# Patient Record
Sex: Male | Born: 1940 | Race: White | Hispanic: No | Marital: Married | Smoking: Former smoker
Health system: Southern US, Community
[De-identification: ages and names within clinical notes are randomized; demographics above are authoritative.]

## PROBLEM LIST (undated history)

## (undated) DIAGNOSIS — E785 Hyperlipidemia, unspecified: Secondary | ICD-10-CM

## (undated) DIAGNOSIS — H3530 Unspecified macular degeneration: Secondary | ICD-10-CM

## (undated) DIAGNOSIS — M545 Low back pain: Secondary | ICD-10-CM

## (undated) DIAGNOSIS — L719 Rosacea, unspecified: Secondary | ICD-10-CM

## (undated) DIAGNOSIS — K219 Gastro-esophageal reflux disease without esophagitis: Secondary | ICD-10-CM

## (undated) DIAGNOSIS — C61 Malignant neoplasm of prostate: Secondary | ICD-10-CM

## (undated) DIAGNOSIS — F5104 Psychophysiologic insomnia: Secondary | ICD-10-CM

## (undated) DIAGNOSIS — G8929 Other chronic pain: Secondary | ICD-10-CM

## (undated) DIAGNOSIS — M25512 Pain in left shoulder: Secondary | ICD-10-CM

## (undated) HISTORY — DX: Rosacea, unspecified: L71.9

## (undated) HISTORY — DX: Low back pain: M54.5

## (undated) HISTORY — PX: HERNIA REPAIR: SHX51

## (undated) HISTORY — DX: Other chronic pain: G89.29

## (undated) HISTORY — DX: Pain in left shoulder: M25.512

## (undated) HISTORY — DX: Hyperlipidemia, unspecified: E78.5

## (undated) HISTORY — DX: Unspecified macular degeneration: H35.30

## (undated) HISTORY — DX: Malignant neoplasm of prostate: C61

## (undated) HISTORY — DX: Gastro-esophageal reflux disease without esophagitis: K21.9

## (undated) HISTORY — DX: Psychophysiologic insomnia: F51.04

---

## 2006-02-16 ENCOUNTER — Emergency Department (HOSPITAL_COMMUNITY): Admission: EM | Admit: 2006-02-16 | Discharge: 2006-02-16 | Payer: Self-pay | Admitting: Emergency Medicine

## 2006-12-24 ENCOUNTER — Ambulatory Visit (HOSPITAL_COMMUNITY): Admission: RE | Admit: 2006-12-24 | Discharge: 2006-12-24 | Payer: Self-pay | Admitting: Gastroenterology

## 2009-04-30 ENCOUNTER — Encounter (INDEPENDENT_AMBULATORY_CARE_PROVIDER_SITE_OTHER): Payer: Self-pay | Admitting: Urology

## 2009-04-30 ENCOUNTER — Inpatient Hospital Stay (HOSPITAL_COMMUNITY): Admission: RE | Admit: 2009-04-30 | Discharge: 2009-05-01 | Payer: Self-pay | Admitting: Urology

## 2010-09-22 HISTORY — PX: PROSTATE SURGERY: SHX751

## 2010-12-28 LAB — BASIC METABOLIC PANEL
CO2: 32 mEq/L (ref 19–32)
Calcium: 9.4 mg/dL (ref 8.4–10.5)
Creatinine, Ser: 1.04 mg/dL (ref 0.4–1.5)
GFR calc Af Amer: >60 mL/min (ref >60)
GFR calc non Af Amer: >60 mL/min (ref >60)
Sodium: 138 mEq/L (ref 135–145)

## 2010-12-28 LAB — HEMOGLOBIN AND HEMATOCRIT, BLOOD: HCT: 37.7 % — ABNORMAL LOW (ref 39.0–52.0)

## 2010-12-28 LAB — CBC
Hemoglobin: 14.2 g/dL (ref 13.0–17.0)
MCHC: 33.9 g/dL (ref 30.0–36.0)
RBC: 4.51 MIL/uL (ref 4.22–5.81)
RDW: 13 % (ref 11.5–15.5)

## 2010-12-28 LAB — TYPE AND SCREEN
ABO/RH(D): O NEG
Antibody Screen: NEGATIVE

## 2011-02-04 NOTE — Discharge Summary (Signed)
NAME:  COSTA, JHA NO.:  [id number]   MEDICAL RECORD NO.:  [id number]          PATIENT TYPE:  INP   LOCATION:  1418                         FACILITY:  [facility]   PHYSICIAN:  Heloise Purpura, MD      DATE OF BIRTH:  04-Sep-1941   DATE OF ADMISSION:  04/30/2009  DATE OF DISCHARGE:  05/01/2009                               DISCHARGE SUMMARY   ADMISSION DIAGNOSES:  1. Clinically localized adenocarcinoma of the prostate (clinical stage      T1C NX MX).  2. Recurrent balanitis.   DISCHARGE DIAGNOSES:  1. Clinically localized adenocarcinoma of the prostate (clinical stage      T1C NX MX).  2. Recurrent balanitis.   PROCEDURES:  1. Robotic-assisted laparoscopic radical prostatectomy (bilateral      nerve sparing).  2. Circumcision.   HISTORY AND PHYSICAL:  For full details, please see admission history  and physical.  Mr. Sperbeck is a 70 year old gentleman who was recently  found to have clinically localized adenocarcinoma of the prostate.  He  underwent an extensive evaluation and discussion with multiple  physicians regarding management options for treatment and elected to  proceed with surgical therapy in the above-named procedure.  In  addition, he has a history of recurrent balanitis, and did wish to  proceed with a circumcision.   HOSPITAL COURSE:  On April 30, 2009, he was taken to the operating room  where he underwent the above-named procedures, which he tolerated well  without complications.  Postoperatively, he was able to be transferred  to a regular hospital room following recovery from anesthesia.  He was  able to begin ambulation that evening.  He remained hemodynamically  stable.  His postoperative hematocrit was 37.7.  On the morning of  postoperative day #1, his hematocrit was also found to be stable at  33.3.  He maintained excellent urine output with minimal output from his  pelvic drain.  Therefore, the pelvic drain was removed.  He was placed  on a clear liquid diet and continued to ambulate.  He was reevaluated on  the afternoon of postoperative day #1.  Although he did have complaints  of hiccups while ambulating.  His urine output, blood pressure, and  pulse all remained stable.  He was also able to tolerate his clear  liquid diet without nausea or vomiting.  He was felt to be stable for  discharge as he had met all discharge criteria.   DISPOSITION:  Home.   DISCHARGE MEDICATIONS:  He was instructed to resume his regular home  medications including trazodone.  In addition, he was provided a  prescription for Vicodin to use as needed for pain and told to use  Colace as a stool softener.  He was instructed to hold his aspirin,  herbal supplements, and multivitamins for 7 days.  He was also given a  prescription for Cipro to begin 1 day prior to return visit for removal  of Foley catheter.   DISCHARGE INSTRUCTIONS:  He was instructed to be ambulatory but  specifically told to refrain from any heavy lifting, strenuous activity,  or driving.  He was instructed on routine Foley catheter care and told  to gradually advance his diet over the course of the next few days.   FOLLOW UP:  He will follow up in 1 week for removal of Foley catheter  and skin staples.      Delia Chimes, NP      Heloise Purpura, MD  Electronically Signed    MA/MEDQ  D:  05/01/2009  T:  05/01/2009  Job:  [id number]

## 2011-02-04 NOTE — Op Note (Signed)
NAME:  Harold Leach, Harold Leach NO.:  [id number]   MEDICAL RECORD NO.:  [id number]          PATIENT TYPE:  INP   LOCATION:  0001                         FACILITY:  [facility]   PHYSICIAN:  Heloise Purpura, MD      DATE OF BIRTH:  Mar 17, 1941   DATE OF PROCEDURE:  04/30/2009  DATE OF DISCHARGE:                               OPERATIVE REPORT   PREOPERATIVE DIAGNOSES:  1. Clinically localized adenocarcinoma of prostate (clinical stage      T1C, NX, MX).  2. Recurrent balanitis.   POSTOPERATIVE DIAGNOSES:  1. Clinically localized adenocarcinoma of prostate (clinical stage      T1C, NX, MX).  2. Recurrent balanitis.   PROCEDURES:  1. Robotic assisted laparoscopic radical prostatectomy (bilateral      nerve sparing).  2. Circumcision.   SURGEON:  Dr. Heloise Purpura.   ASSISTANT:  Delia Chimes, nurse practitioner.   ANESTHESIA:  General.   COMPLICATIONS:  None.   ESTIMATED BLOOD LOSS:  100 mL.   INTRAVENOUS FLUIDS:  2 liters lactated Ringer's.   SPECIMENS:  1. Prostate and seminal vesicles.  2. Foreskin.   DISPOSITION OF SPECIMENS:  To pathology.   DRAINS:  1. 20-French coude catheter.  2. #19 Blake pelvic drain.   INDICATION:  Harold Leach is a 70 year old gentleman who was recently found  to have clinically localized adenocarcinoma of the prostate.  He  underwent an extensive evaluation and discussion with multiple  physicians regarding management options for treatment and elects to  proceed with surgical therapy and the above procedure.  In addition, he  has a history of recurrent balanitis and did wish to proceed with a  circumcision.  The potential risks, complications, and alternative  treatment options were discussed in detail and informed consent was  obtained.   DESCRIPTION OF PROCEDURE:  The patient was taken to the operating room  and a general anesthetic was administered.  He was given preoperative  antibiotics, placed in the dorsal lithotomy position,  and prepped and  draped in the usual sterile fashion.  Next, a preoperative time-out was  performed.  A Foley catheter was inserted into the bladder and a site  was selected just superior to the umbilicus for placement of the camera  port.  This was placed using a standard open Hassan technique which  allowed entry into the peritoneal cavity under direct vision without  difficulty.  A 12 mm port was then placed and with the 0 degrees lens,  the abdomen was inspected.  There was no evidence of any intra-abdominal  injuries or other abnormalities.  The remaining ports were then placed.  8 mm robotic ports were placed in the right lower quadrant, left lower  quadrant and far left lower quadrant.  A 5 mm port was placed between  the camera port and the right robotic port and a 12 mm port was placed  in the far right lateral abdominal wall for laparoscopic assistance.  All ports placed under direct vision and without difficulty.  The  surgical cart was then docked.  With the aid of cautery scissors, the  bladder was reflected posteriorly allowing entry into the space of  Retzius and identification of the endopelvic fascia and prostate.  The  endopelvic fascia was then incised from the apex back to the base of the  prostate bilaterally and the underlying levator muscle fibers were swept  laterally off the prostate thereby isolating the dorsal venous complex.  The dorsal vein was then stapled and divided with a 45 mm flex the  flexible Echelon stapler.  The bladder neck was then identified with the  aid of Foley catheter manipulation and was divided anteriorly thereby  exposing the Foley catheter.  The catheter balloon was deflated and the  catheter was brought into the operative field and used to retract the  prostate anteriorly.  This exposed the posterior bladder neck which was  then dissected free from the prostate.  The patient's bladder was noted  be somewhat thin and the ureteral orifices  were seen somewhat close to  the bladder neck.  Care was taken in the bladder neck dissection and the  space between the bladder neck and prostate was carefully developed  thereby exposing the vasa deferentia and seminal vesicles.  The vasa  deferentia were isolated, divided and lifted anteriorly.  The seminal  vesicles were then dissected down to their tips with care to control the  seminal vesicle arterial blood supply.  The seminal vesicles were then  lifted anteriorly in the space between Denonvilliers fascia and the  anterior rectum was bluntly developed thereby isolating the vascular  pedicles of prostate.  The lateral prostatic fascia was then sharply  incised allowing the neurovascular bundles to be released bilaterally.  The vascular pedicles of prostate were then ligated with Hem-o-lok clips  above the level of the neurovascular bundles and divided with sharp cold  scissor dissection.  The neurovascular bundles were swept off the apex  of prostate and urethra and the urethra was sharply transected allowing  the prostate specimen to be disarticulated.  The pelvis was then  copiously irrigated and hemostasis was ensured.  There was no evidence  for a rectal injury.  Attention then turned to the urethral anastomosis.  A 2-0 Vicryl slip-knot was placed between Denonvilliers fascia, the  posterior bladder neck, and the posterior urethra to reapproximate these  structures.  A double-armed 3-0 Monocryl suture was then used to perform  a 360 degrees running tension-free anastomosis between the bladder neck  and urethra.  A new 20-French coude catheter was inserted into the  bladder and irrigated.  There was no evidence of blood clots within the  bladder and the anastomosis appeared to be watertight.  A  #19 Blake  drain was then brought through the left robotic port and positioned  appropriately within the pelvis.  It was secured to skin with a nylon  suture.  The surgical cart was then  undocked.  The right lateral 12 mm  port site was closed with a 0-0 Vicryl suture placed with the aid of the  suture passer device.  All remaining ports were then removed under  direct vision and the prostate specimen was removed intact within the  Endopouch retrieval bag via the periumbilical port site.  This fascial  opening was closed with a running 0-0 Vicryl suture.  All port sites  were injected with 0.25% Marcaine and reapproximated at the skin level  with staples.  Sterile dressings were applied.  Attention then turned to  the penis.  The proximal and distal circumferential incision sites were  marked out with a marking pen in preparation for the circumcision.  These were then incised with a knife and once the incision sites were  created circumferentially, a clamp was placed onto the dorsal aspect of  the foreskin and the foreskin was subsequently divided in the midline.  The Bovie electrocautery was used to then divide the prepuce from the  underlying fascia until it was removed in its entirety.  Hemostasis was  ensured and the skin edges were then brought together with interrupted 3-  0 chromic suture dorsally, ventrally and on either lateral aspect of the  penis.  The skin edges between each quadrant were then reapproximated  with a running 3-  0 chromic suture.  A Vaseline gauze dressing was then placed over this  incision site.  The patient appeared to tolerate procedure well without  complications.  He was able to be awakened and transferred to recovery  unit in satisfactory condition.      Heloise Purpura, MD  Electronically Signed     LB/MEDQ  D:  04/30/2009  T:  04/30/2009  Job:  [id number]

## 2012-12-06 ENCOUNTER — Other Ambulatory Visit: Payer: Self-pay | Admitting: Dermatology

## 2014-03-20 ENCOUNTER — Encounter (INDEPENDENT_AMBULATORY_CARE_PROVIDER_SITE_OTHER): Payer: Self-pay | Admitting: General Surgery

## 2014-03-20 ENCOUNTER — Ambulatory Visit (INDEPENDENT_AMBULATORY_CARE_PROVIDER_SITE_OTHER): Payer: Medicare Other | Admitting: General Surgery

## 2014-03-20 VITALS — BP 118/65 | HR 53 | Temp 96.9°F | Resp 14 | Ht 70.0 in | Wt 164.2 lb

## 2014-03-20 DIAGNOSIS — K4090 Unilateral inguinal hernia, without obstruction or gangrene, not specified as recurrent: Secondary | ICD-10-CM

## 2014-03-20 NOTE — Patient Instructions (Signed)
You have a right inguinal hernia. This has been getting slowly larger , but not much pain.  We talked about the surgery to repair this. We talked about timing. We decided to hold off until late fall or Winter.  Return to see Dr. Dalbert Batman in September for preoperative examination and we will decide when to do the surgery then, depending on your schedule.     Inguinal Hernia, Adult Muscles help keep everything in the body in its proper place. But if a weak spot in the muscles develops, something can poke through. That is called a hernia. When this happens in the lower part of the belly (abdomen), it is called an inguinal hernia. (It takes its name from a part of the body in this region called the inguinal canal.) A weak spot in the wall of muscles lets some fat or part of the small intestine bulge through. An inguinal hernia can develop at any age. Men get them more often than women. CAUSES  In adults, an inguinal hernia develops over time.  It can be triggered by:  Suddenly straining the muscles of the lower abdomen.  Lifting heavy objects.  Straining to have a bowel movement. Difficult bowel movements (constipation) can lead to this.  Constant coughing. This may be caused by smoking or lung disease.  Being overweight.  Being pregnant.  Working at a job that requires long periods of standing or heavy lifting.  Having had an inguinal hernia before. One type can be an emergency situation. It is called a strangulated inguinal hernia. It develops if part of the small intestine slips through the weak spot and cannot get back into the abdomen. The blood supply can be cut off. If that happens, part of the intestine may die. This situation requires emergency surgery. SYMPTOMS  Often, a small inguinal hernia has no symptoms. It is found when a healthcare provider does a physical exam. Larger hernias usually have symptoms.   In adults, symptoms may include:  A lump in the groin. This is  easier to see when the person is standing. It might disappear when lying down.  In men, a lump in the scrotum.  Pain or burning in the groin. This occurs especially when lifting, straining or coughing.  A dull ache or feeling of pressure in the groin.  Signs of a strangulated hernia can include:  A bulge in the groin that becomes very painful and tender to the touch.  A bulge that turns red or purple.  Fever, nausea and vomiting.  Inability to have a bowel movement or to pass gas. DIAGNOSIS  To decide if you have an inguinal hernia, a healthcare provider will probably do a physical examination.  This will include asking questions about any symptoms you have noticed.  The healthcare provider might feel the groin area and ask you to cough. If an inguinal hernia is felt, the healthcare provider may try to slide it back into the abdomen.  Usually no other tests are needed. TREATMENT  Treatments can vary. The size of the hernia makes a difference. Options include:  Watchful waiting. This is often suggested if the hernia is small and you have had no symptoms.  No medical procedure will be done unless symptoms develop.  You will need to watch closely for symptoms. If any occur, contact your healthcare provider right away.  Surgery. This is used if the hernia is larger or you have symptoms.  Open surgery. This is usually an outpatient procedure (you will  not stay overnight in a hospital). An cut (incision) is made through the skin in the groin. The hernia is put back inside the abdomen. The weak area in the muscles is then repaired by herniorrhaphy or hernioplasty. Herniorrhaphy: in this type of surgery, the weak muscles are sewn back together. Hernioplasty: a patch or mesh is used to close the weak area in the abdominal wall.  Laparoscopy. In this procedure, a surgeon makes small incisions. A thin tube with a tiny video camera (called a laparoscope) is put into the abdomen. The surgeon  repairs the hernia with mesh by looking with the video camera and using two long instruments. HOME CARE INSTRUCTIONS   After surgery to repair an inguinal hernia:  You will need to take pain medicine prescribed by your healthcare provider. Follow all directions carefully.  You will need to take care of the wound from the incision.  Your activity will be restricted for awhile. This will probably include no heavy lifting for several weeks. You also should not do anything too active for a few weeks. When you can return to work will depend on the type of job that you have.  During "watchful waiting" periods, you should:  Maintain a healthy weight.  Eat a diet high in fiber (fruits, vegetables and whole grains).  Drink plenty of fluids to avoid constipation. This means drinking enough water and other liquids to keep your urine clear or pale yellow.  Do not lift heavy objects.  Do not stand for long periods of time.  Quit smoking. This should keep you from developing a frequent cough. SEEK MEDICAL CARE IF:   A bulge develops in your groin area.  You feel pain, a burning sensation or pressure in the groin. This might be worse if you are lifting or straining.  You develop a fever of more than 100.5 F (38.1 C). SEEK IMMEDIATE MEDICAL CARE IF:   Pain in the groin increases suddenly.  A bulge in the groin gets bigger suddenly and does not go down.  For men, there is sudden pain in the scrotum. Or, the size of the scrotum increases.  A bulge in the groin area becomes red or purple and is painful to touch.  You have nausea or vomiting that does not go away.  You feel your heart beating much faster than normal.  You cannot have a bowel movement or pass gas.  You develop a fever of more than 102.0 F (38.9 C). Document Released: 01/25/2009 Document Revised: 12/01/2011 Document Reviewed: 01/25/2009 [facility] Patient Information 2015 [place], [place]. This information is not intended  to replace advice given to you by your health care provider. Make sure you discuss any questions you have with your health care provider.

## 2014-03-20 NOTE — Progress Notes (Signed)
Patient ID: Harold Leach, male   DOB: 1940/11/14, 73 y.o.   MRN: [id number]  Chief Complaint  Patient presents with  . Inguinal Hernia    HPI Harold Leach is a 73 y.o. male.  He is referred by Dr. Kathryne Eriksson for evaluation of a right inguinal hernia.  The patient is retired. He is pretty healthy. 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 He's had a bulge in his right groin for over a year. He thinks is getting a little bit larger. Not much pain. He was in no way he should have this repaired. No history of incarceration.  Comorbidities include robotic prostatectomy by Dr. Alinda Money  2012. He has mild incontinence and wears a pad. He used to [employer] course. No cardiac, pulmonary, or endocrine disorders.  HPI  Past Medical History  Diagnosis Date  . Cancer     prostate    Past Surgical History  Procedure Laterality Date  . Prostate surgery  2012    Family History  Problem Relation Age of Onset  . Cancer Father     prostate    Social History History  Substance Use Topics  . Smoking status: Former Smoker    Types: Pipe    Quit date: 03/20/1981  . Smokeless tobacco: Not on file  . Alcohol Use: No    No Known Allergies  Current Outpatient Prescriptions  Medication Sig Dispense Refill  . aspirin 81 MG tablet Take 81 mg by mouth daily.      . Biotin 1000 MCG tablet Take 5,000 mcg by mouth daily.      Marland Kitchen FLORA-Q (FLORA-Q) CAPS capsule Take 1 capsule by mouth daily.      . magnesium gluconate (MAGONATE) 30 MG tablet Take 400 mg by mouth daily.      . Multiple Vitamins-Minerals (ICAPS AREDS FORMULA PO) Take 1 drop by mouth 2 (two) times daily.      . naproxen sodium (ANAPROX) 220 MG tablet Take 220 mg by mouth as needed.      . Omega-3 Fatty Acids (FISH OIL) 645 MG CAPS Take 1,000 mg by mouth daily.      . traZODone (DESYREL) 150 MG tablet        No current facility-administered medications for this visit.     Review of Systems Review of Systems  Constitutional: Negative for fever, chills and unexpected weight change.  HENT: Negative for congestion, hearing loss, sore throat, trouble swallowing and voice change.   Eyes: Negative for visual disturbance.  Respiratory: Negative for cough and wheezing.   Cardiovascular: Negative for chest pain, palpitations and leg swelling.  Gastrointestinal: Negative for nausea, vomiting, abdominal pain, diarrhea, constipation, blood in stool, abdominal distention, anal bleeding and rectal pain.  Genitourinary: Positive for enuresis. Negative for hematuria and difficulty urinating.  Musculoskeletal: Negative for arthralgias.  Skin: Negative for rash and wound.  Neurological: Negative for seizures, syncope, weakness and headaches.  Hematological: Negative for adenopathy. Does not bruise/bleed easily.  Psychiatric/Behavioral: Negative for confusion.    Blood pressure 118/65, pulse 53, temperature 96.9 F (36.1 C), resp. rate 14, height 5\' 10"  (1.778 Leach), weight 164 lb 3.2 oz (74.481 kg).  Physical Exam Physical Exam  Constitutional: He is oriented to person, place, and time. He appears well-developed and well-nourished. No distress.  HENT:  Head: Normocephalic.  Nose: Nose normal.  Mouth/Throat: No oropharyngeal exudate.  Eyes: Conjunctivae and EOM are normal. Pupils are  equal, round, and reactive to light. Right eye exhibits no discharge. Left eye exhibits no discharge. No scleral icterus.  Neck: Normal range of motion. Neck supple. No JVD present. No tracheal deviation present. No thyromegaly present.  Cardiovascular: Normal rate, regular rhythm, normal heart sounds and intact distal pulses.   No murmur heard. Pulmonary/Chest: Effort normal and breath sounds normal. No stridor. No respiratory distress. He has no wheezes. He has no rales. He exhibits no tenderness.  Abdominal: Soft. Bowel sounds are normal. He exhibits no distension and no mass. There is  no tenderness. There is no rebound and no guarding.  Well-healed laparoscopic scars. Umbilicus normal.  Genitourinary:  Golf ball sized right inguinal hernia. Reducible but a little bit tender during the reduction. No evidence of hernia on the left. Both testes descended. No testicular or scrotal mass.  Musculoskeletal: Normal range of motion. He exhibits no edema and no tenderness.  Lymphadenopathy:    He has no cervical adenopathy.  Neurological: He is alert and oriented to person, place, and time. He has normal reflexes. Coordination normal.  Skin: Skin is warm and dry. No rash noted. He is not diaphoretic. No erythema. No pallor.  Psychiatric: He has a normal mood and affect. His behavior is normal. Judgment and thought content normal.    Data Reviewed Dr. Rich Fuchs office notes from September 2014.  Assessment    Right inguinal hernia. Slowly progressive. Desires elective repair this fall   I told him I would avoid a laparoscopic approach because of the significant pressure that a balloon dissector places on the neck of the bladder and prostate. I told them that I thought an anterior approach would be more safe.  Status post robotic prostatectomy for prostate cancer     Plan    He'll return to see me in 3 months for a preop exam. If everything is unchanged we'll plan to go ahead with surgery perhaps in November.  If he develops increasing pain or significant enlargement, he is told to see me sooner  I discussed the indications, details, techniques, numerous risk of the surgery with the patient and his wife. He was given patient information booklet. We discussed the risk of bleeding, infection, recurrence, nerve damage chronic pain, injury to adjacent organs such as the bladder or testicle, and other unforeseen problems. He understands these issues well. The stoma was questions are answered. He agrees with this plan.        HaroldHAYWOOD Leach 03/20/2014, 2:28 PM

## 2014-05-11 ENCOUNTER — Encounter (INDEPENDENT_AMBULATORY_CARE_PROVIDER_SITE_OTHER): Payer: Self-pay | Admitting: General Surgery

## 2014-05-11 ENCOUNTER — Ambulatory Visit (INDEPENDENT_AMBULATORY_CARE_PROVIDER_SITE_OTHER): Payer: Medicare Other | Admitting: General Surgery

## 2014-05-11 VITALS — BP 120/64 | HR 60 | Temp 98.6°F | Ht 70.0 in | Wt 164.0 lb

## 2014-05-11 DIAGNOSIS — K4090 Unilateral inguinal hernia, without obstruction or gangrene, not specified as recurrent: Secondary | ICD-10-CM

## 2014-05-11 NOTE — Progress Notes (Signed)
Patient ID: Harold Leach, male   DOB: 1941/03/24, 73 y.o.   MRN: [id number]  History: This gentleman returns insist that his right inguinal hernia is bothering him more and he was to go ahead and have the surgery. Initial presentation is summarized below: He is referred by Dr. Kathryne Eriksson for evaluation of a right inguinal hernia.  The patient is retired. He is pretty healthy. 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 He's had a bulge in his right groin for over a year. He thinks is getting a little bit larger. Not much pain. He was in no way he should have this repaired. No history of incarceration.  Comorbidities include robotic prostatectomy by Dr. Alinda Money 2012. He has mild incontinence and wears a pad. He used to [employer] course. No cardiac, pulmonary, or endocrine disorders.   Past history, social history, family history, and review of systems are documented on the chart, unchanged and noncontributory except as described above.  Exam: Constitutional: He is oriented to person, place, and time. He appears well-developed and well-nourished. No distress. :  Head: Normocephalic.  Nose: Nose normal.  Eyes: Conjunctivae and EOM are normal. Pupils are equal, round, and reactive to light. Right eye exhibits no discharge..  Neck: Normal range of motion. Neck supple. No JVD present. No tracheal deviation present. No thyromegaly present.  Cardiovascular: Normal rate, regular rhythm, normal heart sounds and intact distal pulses.  No murmur heard.  Pulmonary/Chest: Effort normal and breath sounds normal. No stridor. No respiratory distress. He has no wheezes. He has no rales. He exhibits no tenderness.  Abdominal: Soft. Bowel sounds are normal. He exhibits no distension and no mass. There is no tenderness. There is no rebound and no guarding.  Well-healed laparoscopic scars. Umbilicus normal.  Genitourinary:  Golf ball sized right inguinal  hernia. Reducible but a little bit tender during the reduction. No evidence of hernia on the left. Both testes descended. No testicular or scrotal mass.  Musculoskeletal: Normal range of motion. He exhibits no edema and no tenderness.  Lymphadenopathy:   Neurological: He is alert and oriented to person, place, and time. He has normal reflexes. Coordination normal.  Skin: Skin is warm and dry. No rash noted. He is not diaphoretic. No erythema. No pallor.  Psychiatric: He has a normal mood and affect. His behavior is normal. Judgment and thought content normal.    Constitutional: He is oriented to person, place, and time. He appears well-developed and well-nourished. No distress.  HENT:  Head: Normocephalic.  Nose: Nose normal.  Mouth/Throat: No oropharyngeal exudate.  Eyes: Conjunctivae and EOM are normal. Pupils are equal, round, and reactive to light. Right eye exhibits no discharge. Left eye exhibits no discharge. No scleral icterus.  Neck: Normal range of motion. Neck supple. No JVD present. No tracheal deviation present. No thyromegaly present.  Cardiovascular: Normal rate, regular rhythm, normal heart sounds and intact distal pulses.  No murmur heard.  Pulmonary/Chest: Effort normal and breath sounds normal. No stridor. No respiratory distress. He has no wheezes. He has no rales. He exhibits no tenderness.  Abdominal: Soft. Bowel sounds are normal. He exhibits no distension and no mass. There is no tenderness. There is no rebound and no guarding.  Well-healed laparoscopic scars. Umbilicus normal.  Genitourinary:  Golf ball sized right inguinal hernia. Reducible but a little bit tender during the reduction. No evidence of hernia on the left. Both testes descended. No testicular  or scrotal mass.  Musculoskeletal: Normal range of motion. He exhibits no edema and no tenderness.  Lymphadenopathy:  He has no cervical adenopathy.  Neurological: He is alert and oriented to person, place, and  time. He has normal reflexes. Coordination normal.  Skin: Skin is warm and dry. No rash noted. He is not diaphoretic. No erythema. No pallor.  Psychiatric: He has a normal mood and affect. His behavior is normal. Judgment and thought content normal.   Assessment  Right inguinal hernia. Becoming increasingly more symptomatic with pain and bulge. No history of incarceration  I told him I would avoid a laparoscopic approach because of the significant pressure that a balloon dissector places on the neck of the bladder and prostate. I told them that I thought an anterior approach would be more safe.  Status post robotic prostatectomy for prostate cancer   Plan  Scheduled for open repair of right inguinal hernia with mesh as outpatient.  I discussed the indications, details, techniques, numerous risk of the surgery with the patient and his wife. He was given patient information booklet. We discussed the risk of bleeding, infection, recurrence, nerve damage chronic pain, injury to adjacent organs such as the bladder or testicle, and other unforeseen problems. He understands these issues well. The stoma was questions are answered. He agrees with this plan.   Edsel Petrin. Dalbert Batman, M.D., [facility] General and Minimally invasive Surgery Breast and Colorectal Surgery Office:   [phone] Pager:   [phone]

## 2014-05-11 NOTE — Patient Instructions (Signed)
You will be scheduled for open repair of your right inguinal hernia with mesh. This will be done as an outpatient and you can go home the same day.  You will be restricted to no sports or heavy lifting for one month. You will be able to drive your car in about one week.    Inguinal Hernia, Adult Muscles help keep everything in the body in its proper place. But if a weak spot in the muscles develops, something can poke through. That is called a hernia. When this happens in the lower part of the belly (abdomen), it is called an inguinal hernia. (It takes its name from a part of the body in this region called the inguinal canal.) A weak spot in the wall of muscles lets some fat or part of the small intestine bulge through. An inguinal hernia can develop at any age. Men get them more often than women. CAUSES  In adults, an inguinal hernia develops over time.  It can be triggered by:  Suddenly straining the muscles of the lower abdomen.  Lifting heavy objects.  Straining to have a bowel movement. Difficult bowel movements (constipation) can lead to this.  Constant coughing. This may be caused by smoking or lung disease.  Being overweight.  Being pregnant.  Working at a job that requires long periods of standing or heavy lifting.  Having had an inguinal hernia before. One type can be an emergency situation. It is called a strangulated inguinal hernia. It develops if part of the small intestine slips through the weak spot and cannot get back into the abdomen. The blood supply can be cut off. If that happens, part of the intestine may die. This situation requires emergency surgery. SYMPTOMS  Often, a small inguinal hernia has no symptoms. It is found when a healthcare provider does a physical exam. Larger hernias usually have symptoms.   In adults, symptoms may include:  A lump in the groin. This is easier to see when the person is standing. It might disappear when lying down.  In men, a  lump in the scrotum.  Pain or burning in the groin. This occurs especially when lifting, straining or coughing.  A dull ache or feeling of pressure in the groin.  Signs of a strangulated hernia can include:  A bulge in the groin that becomes very painful and tender to the touch.  A bulge that turns red or purple.  Fever, nausea and vomiting.  Inability to have a bowel movement or to pass gas. DIAGNOSIS  To decide if you have an inguinal hernia, a healthcare provider will probably do a physical examination.  This will include asking questions about any symptoms you have noticed.  The healthcare provider might feel the groin area and ask you to cough. If an inguinal hernia is felt, the healthcare provider may try to slide it back into the abdomen.  Usually no other tests are needed. TREATMENT  Treatments can vary. The size of the hernia makes a difference. Options include:  Watchful waiting. This is often suggested if the hernia is small and you have had no symptoms.  No medical procedure will be done unless symptoms develop.  You will need to watch closely for symptoms. If any occur, contact your healthcare provider right away.  Surgery. This is used if the hernia is larger or you have symptoms.  Open surgery. This is usually an outpatient procedure (you will not stay overnight in a hospital). An cut (incision) is made  through the skin in the groin. The hernia is put back inside the abdomen. The weak area in the muscles is then repaired by herniorrhaphy or hernioplasty. Herniorrhaphy: in this type of surgery, the weak muscles are sewn back together. Hernioplasty: a patch or mesh is used to close the weak area in the abdominal wall.  Laparoscopy. In this procedure, a surgeon makes small incisions. A thin tube with a tiny video camera (called a laparoscope) is put into the abdomen. The surgeon repairs the hernia with mesh by looking with the video camera and using two long  instruments. HOME CARE INSTRUCTIONS   After surgery to repair an inguinal hernia:  You will need to take pain medicine prescribed by your healthcare provider. Follow all directions carefully.  You will need to take care of the wound from the incision.  Your activity will be restricted for awhile. This will probably include no heavy lifting for several weeks. You also should not do anything too active for a few weeks. When you can return to work will depend on the type of job that you have.  During "watchful waiting" periods, you should:  Maintain a healthy weight.  Eat a diet high in fiber (fruits, vegetables and whole grains).  Drink plenty of fluids to avoid constipation. This means drinking enough water and other liquids to keep your urine clear or pale yellow.  Do not lift heavy objects.  Do not stand for long periods of time.  Quit smoking. This should keep you from developing a frequent cough. SEEK MEDICAL CARE IF:   A bulge develops in your groin area.  You feel pain, a burning sensation or pressure in the groin. This might be worse if you are lifting or straining.  You develop a fever of more than 100.5 F (38.1 C). SEEK IMMEDIATE MEDICAL CARE IF:   Pain in the groin increases suddenly.  A bulge in the groin gets bigger suddenly and does not go down.  For men, there is sudden pain in the scrotum. Or, the size of the scrotum increases.  A bulge in the groin area becomes red or purple and is painful to touch.  You have nausea or vomiting that does not go away.  You feel your heart beating much faster than normal.  You cannot have a bowel movement or pass gas.  You develop a fever of more than 102.0 F (38.9 C). Document Released: 01/25/2009 Document Revised: 12/01/2011 Document Reviewed: 01/25/2009 [facility] Patient Information 2015 [place], [place]. This information is not intended to replace advice given to you by your health care provider. Make sure you  discuss any questions you have with your health care provider.

## 2014-05-24 ENCOUNTER — Other Ambulatory Visit (HOSPITAL_COMMUNITY): Payer: Self-pay | Admitting: General Surgery

## 2014-05-24 ENCOUNTER — Ambulatory Visit
Admission: RE | Admit: 2014-05-24 | Discharge: 2014-05-24 | Disposition: A | Discharge status: Home / Self Care | Payer: Medicare Other | Source: Ambulatory Visit | Attending: General Surgery | Admitting: General Surgery

## 2014-05-24 ENCOUNTER — Ambulatory Visit (HOSPITAL_COMMUNITY)
Admission: RE | Admit: 2014-05-24 | Discharge: 2014-05-24 | Disposition: A | Discharge status: Home / Self Care | Payer: Medicare Other | Source: Ambulatory Visit | Attending: General Surgery | Admitting: General Surgery

## 2014-05-24 ENCOUNTER — Other Ambulatory Visit (INDEPENDENT_AMBULATORY_CARE_PROVIDER_SITE_OTHER): Payer: Self-pay | Admitting: General Surgery

## 2014-05-24 DIAGNOSIS — K4090 Unilateral inguinal hernia, without obstruction or gangrene, not specified as recurrent: Secondary | ICD-10-CM | POA: Insufficient documentation

## 2014-05-24 DIAGNOSIS — Z01818 Encounter for other preprocedural examination: Secondary | ICD-10-CM

## 2014-06-02 ENCOUNTER — Encounter (INDEPENDENT_AMBULATORY_CARE_PROVIDER_SITE_OTHER): Payer: PRIVATE HEALTH INSURANCE | Admitting: General Surgery

## 2014-06-14 ENCOUNTER — Encounter (INDEPENDENT_AMBULATORY_CARE_PROVIDER_SITE_OTHER): Payer: PRIVATE HEALTH INSURANCE | Admitting: General Surgery

## 2014-07-11 ENCOUNTER — Encounter (INDEPENDENT_AMBULATORY_CARE_PROVIDER_SITE_OTHER): Payer: Medicare Other | Admitting: General Surgery

## 2015-03-19 ENCOUNTER — Other Ambulatory Visit: Payer: Self-pay

## 2015-04-04 DIAGNOSIS — C61 Malignant neoplasm of prostate: Secondary | ICD-10-CM | POA: Diagnosis not present

## 2015-04-04 DIAGNOSIS — N5201 Erectile dysfunction due to arterial insufficiency: Secondary | ICD-10-CM | POA: Diagnosis not present

## 2015-04-04 DIAGNOSIS — N393 Stress incontinence (female) (male): Secondary | ICD-10-CM | POA: Diagnosis not present

## 2015-04-12 DIAGNOSIS — H5703 Miosis: Secondary | ICD-10-CM | POA: Diagnosis not present

## 2015-04-12 DIAGNOSIS — H524 Presbyopia: Secondary | ICD-10-CM | POA: Diagnosis not present

## 2015-04-12 DIAGNOSIS — H25813 Combined forms of age-related cataract, bilateral: Secondary | ICD-10-CM | POA: Diagnosis not present

## 2015-04-12 DIAGNOSIS — H5203 Hypermetropia, bilateral: Secondary | ICD-10-CM | POA: Diagnosis not present

## 2015-07-15 DIAGNOSIS — Z23 Encounter for immunization: Secondary | ICD-10-CM | POA: Diagnosis not present

## 2015-10-02 DIAGNOSIS — Z822 Family history of deafness and hearing loss: Secondary | ICD-10-CM | POA: Diagnosis not present

## 2015-10-02 DIAGNOSIS — H6122 Impacted cerumen, left ear: Secondary | ICD-10-CM | POA: Diagnosis not present

## 2015-10-02 DIAGNOSIS — H903 Sensorineural hearing loss, bilateral: Secondary | ICD-10-CM | POA: Diagnosis not present

## 2015-10-10 DIAGNOSIS — Z23 Encounter for immunization: Secondary | ICD-10-CM | POA: Diagnosis not present

## 2015-11-12 ENCOUNTER — Emergency Department (HOSPITAL_BASED_OUTPATIENT_CLINIC_OR_DEPARTMENT_OTHER): Payer: Medicare Other

## 2015-11-12 ENCOUNTER — Inpatient Hospital Stay (HOSPITAL_BASED_OUTPATIENT_CLINIC_OR_DEPARTMENT_OTHER)
Admission: EM | Admit: 2015-11-12 | Discharge: 2015-11-15 | DRG: 200 | Disposition: A | Discharge status: Home / Self Care | Payer: Medicare Other | Attending: General Surgery | Admitting: General Surgery

## 2015-11-12 ENCOUNTER — Emergency Department (HOSPITAL_COMMUNITY): Payer: Medicare Other

## 2015-11-12 ENCOUNTER — Encounter (HOSPITAL_BASED_OUTPATIENT_CLINIC_OR_DEPARTMENT_OTHER): Payer: Self-pay | Admitting: *Deleted

## 2015-11-12 DIAGNOSIS — R0602 Shortness of breath: Secondary | ICD-10-CM | POA: Diagnosis not present

## 2015-11-12 DIAGNOSIS — S2242XA Multiple fractures of ribs, left side, initial encounter for closed fracture: Secondary | ICD-10-CM | POA: Diagnosis present

## 2015-11-12 DIAGNOSIS — Z4682 Encounter for fitting and adjustment of non-vascular catheter: Secondary | ICD-10-CM | POA: Diagnosis not present

## 2015-11-12 DIAGNOSIS — Z9689 Presence of other specified functional implants: Secondary | ICD-10-CM

## 2015-11-12 DIAGNOSIS — S272XXA Traumatic hemopneumothorax, initial encounter: Secondary | ICD-10-CM | POA: Diagnosis present

## 2015-11-12 DIAGNOSIS — S2249XA Multiple fractures of ribs, unspecified side, initial encounter for closed fracture: Secondary | ICD-10-CM | POA: Diagnosis present

## 2015-11-12 DIAGNOSIS — J9811 Atelectasis: Secondary | ICD-10-CM | POA: Diagnosis not present

## 2015-11-12 DIAGNOSIS — Z87891 Personal history of nicotine dependence: Secondary | ICD-10-CM

## 2015-11-12 DIAGNOSIS — S270XXA Traumatic pneumothorax, initial encounter: Principal | ICD-10-CM | POA: Diagnosis present

## 2015-11-12 DIAGNOSIS — W208XXA Other cause of strike by thrown, projected or falling object, initial encounter: Secondary | ICD-10-CM | POA: Diagnosis present

## 2015-11-12 DIAGNOSIS — S298XXA Other specified injuries of thorax, initial encounter: Secondary | ICD-10-CM | POA: Diagnosis present

## 2015-11-12 DIAGNOSIS — Z7982 Long term (current) use of aspirin: Secondary | ICD-10-CM | POA: Diagnosis not present

## 2015-11-12 DIAGNOSIS — Z79899 Other long term (current) drug therapy: Secondary | ICD-10-CM | POA: Diagnosis not present

## 2015-11-12 DIAGNOSIS — J939 Pneumothorax, unspecified: Secondary | ICD-10-CM

## 2015-11-12 DIAGNOSIS — M25512 Pain in left shoulder: Secondary | ICD-10-CM | POA: Diagnosis not present

## 2015-11-12 DIAGNOSIS — Z809 Family history of malignant neoplasm, unspecified: Secondary | ICD-10-CM

## 2015-11-12 DIAGNOSIS — K219 Gastro-esophageal reflux disease without esophagitis: Secondary | ICD-10-CM | POA: Diagnosis not present

## 2015-11-12 DIAGNOSIS — R066 Hiccough: Secondary | ICD-10-CM | POA: Diagnosis not present

## 2015-11-12 DIAGNOSIS — S2239XA Fracture of one rib, unspecified side, initial encounter for closed fracture: Secondary | ICD-10-CM | POA: Diagnosis present

## 2015-11-12 DIAGNOSIS — S2232XA Fracture of one rib, left side, initial encounter for closed fracture: Secondary | ICD-10-CM

## 2015-11-12 DIAGNOSIS — S4992XA Unspecified injury of left shoulder and upper arm, initial encounter: Secondary | ICD-10-CM | POA: Diagnosis not present

## 2015-11-12 DIAGNOSIS — T1490XA Injury, unspecified, initial encounter: Secondary | ICD-10-CM

## 2015-11-12 LAB — BASIC METABOLIC PANEL
Anion gap: 6 (ref 5–15)
BUN: 29 mg/dL — AB (ref 6–20)
CO2: 28 mmol/L (ref 22–32)
CREATININE: 1.18 mg/dL (ref 0.61–1.24)
Calcium: 8.9 mg/dL (ref 8.9–10.3)
Chloride: 106 mmol/L (ref 101–111)
GFR calc Af Amer: >60 mL/min (ref >60)
GFR, EST NON AFRICAN AMERICAN: 60 mL/min — AB (ref >60)
GLUCOSE: 117 mg/dL — AB (ref 65–99)
POTASSIUM: 5.2 mmol/L — AB (ref 3.5–5.1)
Sodium: 140 mmol/L (ref 135–145)

## 2015-11-12 LAB — CBC WITH DIFFERENTIAL/PLATELET
Basophils Absolute: 0.1 10*3/uL (ref 0.0–0.1)
Basophils Relative: 1 %
EOS PCT: 2 %
Eosinophils Absolute: 0.1 10*3/uL (ref 0.0–0.7)
HCT: 42.7 % (ref 39.0–52.0)
Hemoglobin: 14.3 g/dL (ref 13.0–17.0)
LYMPHS ABS: 1.3 10*3/uL (ref 0.7–4.0)
LYMPHS PCT: 18 %
MCH: 30.5 pg (ref 26.0–34.0)
MCHC: 33.5 g/dL (ref 30.0–36.0)
MCV: 91 fL (ref 78.0–100.0)
MONO ABS: 0.5 10*3/uL (ref 0.1–1.0)
MONOS PCT: 7 %
Neutro Abs: 5.1 10*3/uL (ref 1.7–7.7)
Neutrophils Relative %: 72 %
PLATELETS: 177 10*3/uL (ref 150–400)
RBC: 4.69 MIL/uL (ref 4.22–5.81)
RDW: 12.4 % (ref 11.5–15.5)
WBC: 7 10*3/uL (ref 4.0–10.5)

## 2015-11-12 LAB — RAPID HIV SCREEN (HIV 1/2 AB+AG)
HIV 1/2 Antibodies: NONREACTIVE
HIV-1 P24 Antigen - HIV24: NONREACTIVE

## 2015-11-12 MED ORDER — ENOXAPARIN SODIUM 40 MG/0.4ML ~~LOC~~ SOLN
40.0000 mg | Freq: Every day | SUBCUTANEOUS | Status: DC
  Administered 2015-11-13 – 2015-11-14 (×3): 40 mg via SUBCUTANEOUS
  Filled 2015-11-12 (×3): qty 0.4

## 2015-11-12 MED ORDER — MORPHINE SULFATE (PF) 4 MG/ML IV SOLN
4.0000 mg | INTRAVENOUS | Status: AC | PRN
  Administered 2015-11-12 (×2): 4 mg via INTRAVENOUS
  Filled 2015-11-12 (×2): qty 1

## 2015-11-12 MED ORDER — SODIUM CHLORIDE 0.9% FLUSH
3.0000 mL | Freq: Two times a day (BID) | INTRAVENOUS | Status: DC
  Administered 2015-11-13 – 2015-11-14 (×5): 3 mL via INTRAVENOUS

## 2015-11-12 MED ORDER — MAGNESIUM GLUCONATE 500 MG PO TABS
500.0000 mg | ORAL_TABLET | Freq: Every day | ORAL | Status: DC
  Administered 2015-11-13 – 2015-11-15 (×3): 500 mg via ORAL
  Filled 2015-11-12 (×3): qty 1

## 2015-11-12 MED ORDER — ONDANSETRON HCL 4 MG PO TABS
4.0000 mg | ORAL_TABLET | Freq: Four times a day (QID) | ORAL | Status: DC | PRN
  Administered 2015-11-15: 4 mg via ORAL
  Filled 2015-11-12: qty 1

## 2015-11-12 MED ORDER — SODIUM CHLORIDE 0.9% FLUSH: 3.0000 mL | INTRAVENOUS | Status: DC | PRN

## 2015-11-12 MED ORDER — LIDOCAINE-EPINEPHRINE 2 %-1:100000 IJ SOLN
INTRAMUSCULAR | Status: AC
  Filled 2015-11-12: qty 1

## 2015-11-12 MED ORDER — MAGNESIUM GLUCONATE 30 MG PO TABS: 400.0000 mg | ORAL_TABLET | Freq: Every day | ORAL | Status: DC

## 2015-11-12 MED ORDER — HYDROMORPHONE HCL 1 MG/ML IJ SOLN
1.0000 mg | INTRAMUSCULAR | Status: DC | PRN
  Administered 2015-11-13: 2 mg via INTRAVENOUS
  Filled 2015-11-12: qty 2

## 2015-11-12 MED ORDER — IOHEXOL 300 MG/ML  SOLN
100.0000 mL | Freq: Once | INTRAMUSCULAR | Status: DC | PRN
Start: 2015-11-12 — End: 2015-11-13

## 2015-11-12 MED ORDER — OXYCODONE HCL 5 MG PO TABS
10.0000 mg | ORAL_TABLET | ORAL | Status: DC | PRN
  Administered 2015-11-13: 10 mg via ORAL
  Filled 2015-11-12: qty 2

## 2015-11-12 MED ORDER — PANTOPRAZOLE SODIUM 40 MG PO TBEC
40.0000 mg | DELAYED_RELEASE_TABLET | Freq: Every day | ORAL | Status: DC
  Filled 2015-11-12: qty 1

## 2015-11-12 MED ORDER — LIDOCAINE-EPINEPHRINE 2 %-1:100000 IJ SOLN
20.0000 mL | Freq: Once | INTRAMUSCULAR | Status: AC
  Administered 2015-11-12: 20 mL

## 2015-11-12 MED ORDER — FENTANYL CITRATE (PF) 100 MCG/2ML IJ SOLN
75.0000 ug | Freq: Once | INTRAMUSCULAR | Status: AC
  Administered 2015-11-12: 75 ug via INTRAVENOUS

## 2015-11-12 MED ORDER — BIOTIN 1000 MCG PO TABS: 5000.0000 ug | ORAL_TABLET | Freq: Every day | ORAL | Status: DC

## 2015-11-12 MED ORDER — PANTOPRAZOLE SODIUM 40 MG IV SOLR: 40.0000 mg | Freq: Every day | INTRAVENOUS | Status: DC

## 2015-11-12 MED ORDER — ONDANSETRON HCL 4 MG/2ML IJ SOLN
4.0000 mg | Freq: Four times a day (QID) | INTRAMUSCULAR | Status: DC | PRN
  Administered 2015-11-13 – 2015-11-15 (×3): 4 mg via INTRAVENOUS
  Filled 2015-11-12 (×3): qty 2

## 2015-11-12 MED ORDER — SODIUM CHLORIDE 0.9 % IV SOLN: 250.0000 mL | INTRAVENOUS | Status: DC | PRN

## 2015-11-12 MED ORDER — FENTANYL CITRATE (PF) 100 MCG/2ML IJ SOLN
INTRAMUSCULAR | Status: AC
  Filled 2015-11-12: qty 2

## 2015-11-12 MED ORDER — FLORA-Q PO CAPS
1.0000 | ORAL_CAPSULE | Freq: Every day | ORAL | Status: DC
  Administered 2015-11-13 – 2015-11-15 (×3): 1 via ORAL
  Filled 2015-11-12 (×3): qty 1

## 2015-11-12 NOTE — ED Notes (Signed)
Dr. Rosendo Gros at bedside inserting chest tube at pt.

## 2015-11-12 NOTE — ED Notes (Signed)
MD at bedside. 

## 2015-11-12 NOTE — H&P (Addendum)
History   Harold Leach is an 75 y.o. male.   Chief Complaint:  Chief Complaint  Patient presents with  . Chest Injury    HPI  The patient is a 75 year old male who is transferred from an outside ER secondary to left-sided rib fractures. Patient states he is moving several sheets of plywood Wednesday pending against his truck. He denies LOC. Patient was having shortness of breath. Upon evaluation in the ER he underwent chest x-ray which revealed left sided rib fractures as well as left pneumothorax. Patient was transferred here after needle decompression per EDP Dr. Oleta Mouse.    Past Medical History  Diagnosis Date  . Cancer [facility])     prostate    Past Surgical History  Procedure Laterality Date  . Prostate surgery  2012  . Hernia repair      Family History  Problem Relation Age of Onset  . Cancer Father     prostate   Social History:  reports that he quit smoking about 34 years ago. His smoking use included Pipe. He does not have any smokeless tobacco history on file. He reports that he does not drink alcohol or use illicit drugs.  Allergies  No Known Allergies  Home Medications   (Not in a hospital admission)  Trauma Course   Results for orders placed or performed during the hospital encounter of 11/12/15 (from the past 48 hour(s))  CBC with Differential     Status: None   Collection Time: 11/12/15  6:00 PM  Result Value Ref Range   WBC 7.0 4.0 - 10.5 K/uL   RBC 4.69 4.22 - 5.81 MIL/uL   Hemoglobin 14.3 13.0 - 17.0 g/dL   HCT 42.7 39.0 - 52.0 %   MCV 91.0 78.0 - 100.0 fL   MCH 30.5 26.0 - 34.0 pg   MCHC 33.5 30.0 - 36.0 g/dL   RDW 12.4 11.5 - 15.5 %   Platelets 177 150 - 400 K/uL   Neutrophils Relative % 72 %   Neutro Abs 5.1 1.7 - 7.7 K/uL   Lymphocytes Relative 18 %   Lymphs Abs 1.3 0.7 - 4.0 K/uL   Monocytes Relative 7 %   Monocytes Absolute 0.5 0.1 - 1.0 K/uL   Eosinophils Relative 2 %   Eosinophils Absolute 0.1 0.0 - 0.7 K/uL   Basophils Relative 1 %     Basophils Absolute 0.1 0.0 - 0.1 K/uL  Basic metabolic panel     Status: Abnormal   Collection Time: 11/12/15  6:00 PM  Result Value Ref Range   Sodium 140 135 - 145 mmol/L   Potassium 5.2 (H) 3.5 - 5.1 mmol/L    Comment: NO VISIBLE HEMOLYSIS   Chloride 106 101 - 111 mmol/L   CO2 28 22 - 32 mmol/L   Glucose, Bld 117 (H) 65 - 99 mg/dL   BUN 29 (H) 6 - 20 mg/dL   Creatinine, Ser 1.18 0.61 - 1.24 mg/dL   Calcium 8.9 8.9 - 10.3 mg/dL   GFR calc non Af Amer 60 (L) >60 mL/min   GFR calc Af Amer >60 >60 mL/min    Comment: (NOTE) The eGFR has been calculated using the CKD EPI equation. This calculation has not been validated in all clinical situations. eGFR's persistently <60 mL/min signify possible Chronic Kidney Disease.    Anion gap 6 5 - 15   Dg Chest 2 View  11/12/2015  CLINICAL DATA:  Hilum fluid fell on top of chest and left shoulder approximately  4 p.m. today. Chest pain and shortness of breath. Unable to move left arm. EXAM: CHEST  2 VIEW COMPARISON:  05/24/2014. FINDINGS: Lungs are hyperexpanded. There is a left-sided pneumothorax, with no pleural line visible on the frontal projection but pleural line clearly seen on the lateral film. Multiple left upper rib fractures are identified involvement at least the left first through third ribs. Plural irregularity underlying the rib fractures suggests hemorrhage. Small volume of subcutaneous emphysema is seen. There is some atelectasis at the left lung base. Cardiopericardial silhouette is at upper limits of normal for size. Small hiatal hernia noted. IMPRESSION: Multiple left upper rib fractures with associated left pneumothorax and subcutaneous emphysema. Critical Value/emergent results were called by me at the time of interpretation on 11/12/2015 at 6:25 pm to Dr. Brantley Stage , who verbally acknowledged these results. Electronically Signed   By: Misty Stanley M.D.   On: 11/12/2015 18:25   Dg Chest Portable 1 View  11/12/2015  CLINICAL DATA:   Post decompression of left lung EXAM: PORTABLE CHEST 1 VIEW COMPARISON:  Prior film same day FINDINGS: Multiple left upper rib fractures again noted. There is progression of subcutaneous emphysema left upper chest wall. There is progression of the left pneumothorax at least 45-50%. Atelectasis of left lung. Right lung is clear. IMPRESSION: There is progression of subcutaneous emphysema left upper chest wall. There is progression of the left pneumothorax at least 45-50%. Atelectasis of left lung. Multiple left upper rib fractures again noted. These results were called by telephone at the time of interpretation on 11/12/2015 at 8:32 pm to Dr. Brantley Stage , who verbally acknowledged these results. Electronically Signed   By: Lahoma Crocker M.D.   On: 11/12/2015 20:32   Dg Shoulder Left  11/12/2015  CLINICAL DATA:  Left shoulder pain, a pile of wood fell on left shoulder and left chest this after known 4 p.m. EXAM: LEFT SHOULDER - 2+ VIEW COMPARISON:  None. FINDINGS: Two views of the left shoulder submitted. No shoulder dislocation. Displaced fracture of the left first, second and third rib. There is nondisplaced fracture of the left third and fourth rib in axillary region. Mild displaced fracture of the left fifth and sixth rib. Subcutaneous emphysema is noted left axilla. There is left upper pneumothorax at least 30%. IMPRESSION: No shoulder dislocation. Displaced fracture of the left first, second and third rib. Additional fracture of the left fourth fifth and probable sixth rib. There is at least 30% left upper pneumothorax. Subcutaneous emphysema noted left axilla. These results were called by telephone at the time of interpretation on 11/12/2015 at 6:31 pm to Dr. Brantley Stage , who verbally acknowledged these results. Electronically Signed   By: Lahoma Crocker M.D.   On: 11/12/2015 18:31    Review of Systems  Constitutional: Negative.   HENT: Negative.   Respiratory: Positive for shortness of breath.   Cardiovascular:  Positive for chest pain.  Musculoskeletal: Negative.   Skin: Negative.   Neurological: Negative.     Blood pressure 143/85, pulse 60, temperature 98.9 F (37.2 C), temperature source Oral, resp. rate 22, height _0  (1.778 m), weight 72.576 kg (160 lb), SpO2 100 %. Physical Exam  Constitutional: He appears well-developed and well-nourished.  HENT:  Head: Normocephalic and atraumatic.  Eyes: Conjunctivae and EOM are normal. Pupils are equal, round, and reactive to light.  Neck: Normal range of motion. Neck supple.  Cardiovascular: Normal rate, regular rhythm and normal heart sounds.   Respiratory: Effort normal and breath  sounds normal.    GI: Soft. Bowel sounds are normal. He exhibits no distension and no mass. There is no tenderness. There is no rebound and no guarding.  Musculoskeletal: Normal range of motion.  Neurological: He is alert.     Procedure: Left 28 French chest tube placed in the left anterior axillary line in between the fourth and fifth ribs. Large air rush upon entering the chest. Patient had the procedure well.  Assessment/Plan 75 year old male with left 1 through 3 rib fractures, with associated left pneumothorax.  1. Patient had a 77 French chest tube placed in the ED on arrival. 2. Patient will be admitted for pain control and chest tube management  Rosario Jacks., Anne Hahn 11/12/2015, 9:21 PM   Procedures

## 2015-11-12 NOTE — ED Notes (Signed)
Patient transported to radiology department via stretcher.

## 2015-11-12 NOTE — ED Notes (Signed)
Pt c/o injury to chest from heavy wood x 2 hrs ago, pt c/o left shoulder pain which radiates to left side of chest

## 2015-11-12 NOTE — ED Notes (Signed)
Acuity in creased and MD made aware

## 2015-11-12 NOTE — ED Notes (Signed)
report given to [place] Writer at [facility] ED

## 2015-11-12 NOTE — ED Notes (Signed)
Pharmacist notified on pt.'s admission orders.

## 2015-11-12 NOTE — ED Notes (Signed)
[facility] here for transport, MD request to wait and do needle decompression for pneumothorax.  Equipment collected.  MD at bedside, [facility] on standby and concent signedby wife.

## 2015-11-12 NOTE — ED Notes (Signed)
Portable chest at bedside, MD at bedside.

## 2015-11-12 NOTE — ED Provider Notes (Signed)
CSN: [id number]     Arrival date & time 11/12/15  1739 History  By signing my name below, I, Eustaquio Maize, attest that this documentation has been prepared under the direction and in the presence of Forde Dandy, MD. Electronically Signed: Eustaquio Maize, ED Scribe. 11/12/2015. 7:03 PM.   Chief Complaint  Patient presents with  . Chest Injury   The history is provided by the patient. No language interpreter was used.     HPI Comments: Harold Leach is a 75 y.o. male who presents to the Emergency Department complaining of sudden onset, constant, left sided chest wall pain and left shoulder pain after 20 sheets of plywood fell against pt earlier today. Pt reports that he was moving the plywood in his truck when the sheets fell against him and pinned him against other pieces of plywood. Pt did not fall or have head injury or LOC. He does mention feeling short of breath as well. Denies abdominal pain, hip pain, neck pain, or any other associated symptoms. Pt is not on any anticoagulants.   Past Medical History  Diagnosis Date  . Cancer [facility])     prostate   Past Surgical History  Procedure Laterality Date  . Prostate surgery  2012  . Hernia repair     Family History  Problem Relation Age of Onset  . Cancer Father     prostate   Social History  Substance Use Topics  . Smoking status: Former Smoker    Types: Pipe    Quit date: 03/20/1981  . Smokeless tobacco: None  . Alcohol Use: No    Review of Systems  Respiratory: Positive for shortness of breath.   Cardiovascular: Positive for chest pain.  Gastrointestinal: Negative for abdominal pain.  Musculoskeletal: Positive for arthralgias (Left shoulder). Negative for neck pain.  Neurological: Negative for syncope.  All other systems reviewed and are negative.  Allergies  Review of patient's allergies indicates no known allergies.  Home Medications   Prior to Admission medications   Medication Sig Start Date End Date Taking?  Authorizing Provider  aspirin 81 MG tablet Take 81 mg by mouth daily.   Yes Historical Provider, MD  Biotin 1000 MCG tablet Take 5,000 mcg by mouth daily.   Yes Historical Provider, MD  ferrous sulfate 325 (65 FE) MG tablet Take 325 mg by mouth daily with breakfast.   Yes Historical Provider, MD  FLORA-Q (FLORA-Q) CAPS capsule Take 1 capsule by mouth daily.   Yes Historical Provider, MD  magnesium gluconate (MAGONATE) 30 MG tablet Take 400 mg by mouth daily.   Yes Historical Provider, MD  Multiple Vitamins-Minerals (ICAPS AREDS FORMULA PO) Take 2 capsules by mouth daily.    Yes Historical Provider, MD  Omega-3 Fatty Acids (FISH OIL) 645 MG CAPS Take 1,000 mg by mouth daily.   Yes Historical Provider, MD  traZODone (DESYREL) 150 MG tablet Take 150 mg by mouth at bedtime.  01/23/14  Yes Historical Provider, MD  vitamin C (ASCORBIC ACID) 500 MG tablet Take 1,000 mg by mouth daily.   Yes Historical Provider, MD   BP 127/72 mmHg  Pulse 61  Temp(Src) 98.9 F (37.2 C) (Oral)  Resp 20  Ht 5\' 10"  (1.778 m)  Wt 160 lb (72.576 kg)  BMI 22.96 kg/m2  SpO2 95%   Physical Exam  Physical Exam  Nursing note and vitals reviewed. Constitutional: Well developed, well nourished, non-toxic, and in no acute distress Head: Normocephalic and atraumatic.  Mouth/Throat: Oropharynx is clear  and moist.  Neck: Normal range of motion. Neck supple. No cervical spine tenderness. Cardiovascular: Normal rate and regular rhythm.   Pulmonary/Chest: Effort normal. Tenderness to palpation of anterior upper left chest wall without significant bruising or crepitus. Diminished breath sounds in the left upper lung.  Abdominal: Soft. There is no tenderness. There is no rebound and no guarding.  Musculoskeletal: Normal range of motion. No TLS tenderness. No deformities of the extremities.  Neurological: Alert, no facial droop, fluent speech, moves all extremities symmetrically Skin: Skin is warm and dry.  Psychiatric:  Cooperative.  ED Course  Needle decompression Date/Time: 11/12/2015 11:09 PM Performed by: Brantley Stage DUO Authorized by: Brantley Stage DUO Consent: Verbal consent obtained. Risks and benefits: risks, benefits and alternatives were discussed Consent given by: patient Patient identity confirmed: verbally with patient Time out: Immediately prior to procedure a "time out" was called to verify the correct patient, procedure, equipment, support staff and site/side marked as required. Preparation: Patient was prepped and draped in the usual sterile fashion. Local anesthesia used: yes Anesthesia: local infiltration Local anesthetic: lidocaine 2% with epinephrine Anesthetic total: 10 ml Patient sedated: no Patient tolerance: Patient tolerated the procedure well with no immediate complications   (including critical care time)  DIAGNOSTIC STUDIES: Oxygen Saturation is 96% on RA, normal by my interpretation.    COORDINATION OF CARE: 6:30 PM-Discussed treatment plan with pt at bedside and pt agreed to plan.   Labs Review Labs Reviewed  BASIC METABOLIC PANEL - Abnormal; Notable for the following:    Potassium 5.2 (*)    Glucose, Bld 117 (*)    BUN 29 (*)    GFR calc non Af Amer 60 (*)    All other components within normal limits  CBC WITH DIFFERENTIAL/PLATELET  RAPID HIV SCREEN (HIV 1/2 AB+AG)  HEPATITIS B SURFACE ANTIGEN  HEPATITIS C ANTIBODY (REFLEX)    Imaging Review Dg Chest 2 View  11/12/2015  CLINICAL DATA:  Hilum fluid fell on top of chest and left shoulder approximately 4 p.m. today. Chest pain and shortness of breath. Unable to move left arm. EXAM: CHEST  2 VIEW COMPARISON:  05/24/2014. FINDINGS: Lungs are hyperexpanded. There is a left-sided pneumothorax, with no pleural line visible on the frontal projection but pleural line clearly seen on the lateral film. Multiple left upper rib fractures are identified involvement at least the left first through third ribs. Plural irregularity  underlying the rib fractures suggests hemorrhage. Small volume of subcutaneous emphysema is seen. There is some atelectasis at the left lung base. Cardiopericardial silhouette is at upper limits of normal for size. Small hiatal hernia noted. IMPRESSION: Multiple left upper rib fractures with associated left pneumothorax and subcutaneous emphysema. Critical Value/emergent results were called by me at the time of interpretation on 11/12/2015 at 6:25 pm to Dr. Brantley Stage , who verbally acknowledged these results. Electronically Signed   By: Misty Stanley M.D.   On: 11/12/2015 18:25   Dg Chest Port 1 View  11/12/2015  CLINICAL DATA:  Chest tube placement. Assess residual pneumothorax. Initial encounter. EXAM: PORTABLE CHEST 1 VIEW COMPARISON:  Chest radiograph performed earlier today at 7:54 p.m. FINDINGS: Status post placement of a left apical chest tube, the previously noted pneumothorax has resolved. Patchy left basilar and mid lung airspace opacities likely reflect atelectasis. Minimal right basilar atelectasis is noted. Vascular congestion is seen. No significant pleural effusion is identified. The cardiomediastinal silhouette is borderline normal in size. Multiple displaced left-sided rib fractures are again noted. Scattered  soft tissue air is noted along the left chest wall, perhaps mildly improved from the prior study. IMPRESSION: 1. Interval resolution of pneumothorax, status post placement of left apical chest tube. Patchy left basilar and left midlung airspace opacities likely reflect atelectasis. Minimal right basilar atelectasis noted. Vascular congestion seen. 2. Multiple displaced left-sided rib fractures again noted. Electronically Signed   By: Garald Balding M.D.   On: 11/12/2015 21:38   Dg Chest Portable 1 View  11/12/2015  CLINICAL DATA:  Post decompression of left lung EXAM: PORTABLE CHEST 1 VIEW COMPARISON:  Prior film same day FINDINGS: Multiple left upper rib fractures again noted. There is  progression of subcutaneous emphysema left upper chest wall. There is progression of the left pneumothorax at least 45-50%. Atelectasis of left lung. Right lung is clear. IMPRESSION: There is progression of subcutaneous emphysema left upper chest wall. There is progression of the left pneumothorax at least 45-50%. Atelectasis of left lung. Multiple left upper rib fractures again noted. These results were called by telephone at the time of interpretation on 11/12/2015 at 8:32 pm to Dr. Brantley Stage , who verbally acknowledged these results. Electronically Signed   By: Lahoma Crocker M.D.   On: 11/12/2015 20:32   Dg Shoulder Left  11/12/2015  CLINICAL DATA:  Left shoulder pain, a pile of wood fell on left shoulder and left chest this after known 4 p.m. EXAM: LEFT SHOULDER - 2+ VIEW COMPARISON:  None. FINDINGS: Two views of the left shoulder submitted. No shoulder dislocation. Displaced fracture of the left first, second and third rib. There is nondisplaced fracture of the left third and fourth rib in axillary region. Mild displaced fracture of the left fifth and sixth rib. Subcutaneous emphysema is noted left axilla. There is left upper pneumothorax at least 30%. IMPRESSION: No shoulder dislocation. Displaced fracture of the left first, second and third rib. Additional fracture of the left fourth fifth and probable sixth rib. There is at least 30% left upper pneumothorax. Subcutaneous emphysema noted left axilla. These results were called by telephone at the time of interpretation on 11/12/2015 at 6:31 pm to Dr. Brantley Stage , who verbally acknowledged these results. Electronically Signed   By: Lahoma Crocker M.D.   On: 11/12/2015 18:31     I have personally reviewed and evaluated these images as part of my medical decision-making.   EKG Interpretation None       CRITICAL CARE Performed by: Forde Dandy   Total critical care time: 35 minutes  Critical care time was exclusive of separately billable procedures and  treating other patients.  Critical care was necessary to treat or prevent imminent or life-threatening deterioration.  Critical care was time spent personally by me on the following activities: development of treatment plan with patient and/or surrogate as well as nursing, discussions with consultants, evaluation of patient's response to treatment, examination of patient, obtaining history from patient or surrogate, ordering and performing treatments and interventions, ordering and review of laboratory studies, ordering and review of radiographic studies, pulse oximetry and re-evaluation of patient's condition.  MDM   Final diagnoses:  Injury  Rib fractures, left, closed, initial encounter  Pneumothorax   75 year old male who presents with chest injury. In no respiratory distress on presentation. There is mild tachypnea, but no increased work of breathing, no conversational dyspnea. Normal oxygenation on room air. X-ray concerning for multiple contiguous rib fractures of the anterior left upper ribs, with associated pneumothorax. Hemodynamically stable with this. Placed on non-rebreather. Discussed  with Dr. Rosendo Gros, recommending ED to ED transfer. Given large pneumothorax, discussed potential chest tube placement versus needle decompression. Given that patient was stable and transfer readily available, attempted needle decompression at bedside, with placement of chest tube when he arrives at [facility] ED, which Dr. Rosendo Gros was comfortable with. Discussed with Dr. Ralene Bathe at [facility] ED who will accept transfer and plan for chest tube on patient's arrival.    I personally performed the services described in this documentation, which was scribed in my presence. The recorded information has been reviewed and is accurate.       Forde Dandy, MD 11/12/15 [phone]

## 2015-11-12 NOTE — ED Notes (Signed)
[facility] loading pt for transport

## 2015-11-12 NOTE — ED Provider Notes (Signed)
CSN: [id number]     Arrival date & time 11/12/15  1739 History   First MD Initiated Contact with Patient 11/12/15 1755     Chief Complaint  Patient presents with  . Chest Injury   Patient is a 75 y.o. male presenting with trauma. The history is provided by the patient and the EMS personnel. No language interpreter was used.  Trauma Mechanism of injury: Wood fell on patient's left shoulder Injury location: shoulder/arm and torso Injury location detail: L shoulder and L chest Incident location: home Time since incident: 2 hours Arrived directly from scene: no   Current symptoms:      Pain scale: 5/10      Pain quality: aching      Pain timing: constant      Associated symptoms:            Reports chest pain and difficulty breathing.            Denies abdominal pain, back pain, headache, hearing loss, nausea, neck pain and vomiting.   Relevant PMH:      Tetanus status: unknown      The patient has not been admitted to the hospital due to injury in the past year, and has not been treated and released from the ED due to injury in the past year.   Past Medical History  Diagnosis Date  . Cancer [facility])     prostate   Past Surgical History  Procedure Laterality Date  . Prostate surgery  2012  . Hernia repair     Family History  Problem Relation Age of Onset  . Cancer Father     prostate   Social History  Substance Use Topics  . Smoking status: Former Smoker    Types: Pipe    Quit date: 03/20/1981  . Smokeless tobacco: None  . Alcohol Use: No    Review of Systems  Constitutional: Negative for fever, chills, activity change and appetite change.  HENT: Negative for congestion, dental problem, ear pain, facial swelling, hearing loss, rhinorrhea, sneezing, sore throat, trouble swallowing and voice change.   Eyes: Negative for photophobia, pain, redness and visual disturbance.  Respiratory: Positive for shortness of breath. Negative for apnea, cough, chest tightness, wheezing  and stridor.   Cardiovascular: Positive for chest pain. Negative for palpitations and leg swelling.  Gastrointestinal: Negative for nausea, vomiting, abdominal pain, diarrhea, constipation, blood in stool and abdominal distention.  Endocrine: Negative for polydipsia and polyuria.  Genitourinary: Negative for frequency, hematuria, flank pain, decreased urine volume and difficulty urinating.  Musculoskeletal: Negative for back pain, joint swelling, gait problem, neck pain and neck stiffness.  Skin: Negative for rash and wound.  Allergic/Immunologic: Negative for immunocompromised state.  Neurological: Negative for dizziness, syncope, facial asymmetry, speech difficulty, weakness, light-headedness, numbness and headaches.  Hematological: Negative for adenopathy.  Psychiatric/Behavioral: Negative for suicidal ideas, behavioral problems, confusion, sleep disturbance and agitation. The patient is not nervous/anxious.   All other systems reviewed and are negative.     Allergies  Review of patient's allergies indicates no known allergies.  Home Medications   Prior to Admission medications   Medication Sig Start Date End Date Taking? Authorizing Provider  aspirin 81 MG tablet Take 81 mg by mouth daily.   Yes Historical Provider, MD  Biotin 1000 MCG tablet Take 5,000 mcg by mouth daily.   Yes Historical Provider, MD  ferrous sulfate 325 (65 FE) MG tablet Take 325 mg by mouth daily with breakfast.   Yes Historical  Provider, MD  FLORA-Q [facility]) CAPS capsule Take 1 capsule by mouth daily.   Yes Historical Provider, MD  magnesium gluconate (MAGONATE) 30 MG tablet Take 400 mg by mouth daily.   Yes Historical Provider, MD  Multiple Vitamins-Minerals (ICAPS AREDS FORMULA PO) Take 2 capsules by mouth daily.    Yes Historical Provider, MD  Omega-3 Fatty Acids (FISH OIL) 645 MG CAPS Take 1,000 mg by mouth daily.   Yes Historical Provider, MD  traZODone (DESYREL) 150 MG tablet Take 150 mg by mouth at  bedtime.  01/23/14  Yes Historical Provider, MD  vitamin C (ASCORBIC ACID) 500 MG tablet Take 1,000 mg by mouth daily.   Yes Historical Provider, MD   BP 127/72 mmHg  Pulse 61  Temp(Src) 98.9 F (37.2 C) (Oral)  Resp 20  Ht 5\' 10"  (1.778 m)  Wt 72.576 kg  BMI 22.96 kg/m2  SpO2 95% Physical Exam  Constitutional: He is oriented to person, place, and time. He appears well-developed and well-nourished. No distress.  HENT:  Head: Normocephalic and atraumatic.  Right Ear: External ear normal.  Left Ear: External ear normal.  Eyes: Pupils are equal, round, and reactive to light. Right eye exhibits no discharge. Left eye exhibits no discharge.  Neck: Normal range of motion. No JVD present. No tracheal deviation present.  Cardiovascular: Normal rate, regular rhythm and normal heart sounds.  Exam reveals no friction rub.   No murmur heard. Pulmonary/Chest: Effort normal. No stridor. No respiratory distress. He has decreased breath sounds in the left upper field, the left middle field and the left lower field. He has no wheezes. He exhibits tenderness.  Abdominal: Soft. Bowel sounds are normal. He exhibits no distension. There is no rebound and no guarding.  Musculoskeletal: Normal range of motion. He exhibits no edema or tenderness.  Lymphadenopathy:    He has no cervical adenopathy.  Neurological: He is alert and oriented to person, place, and time. No cranial nerve deficit. Coordination normal.  Skin: Skin is warm and dry. No rash noted. No pallor.  Psychiatric: He has a normal mood and affect. His behavior is normal. Judgment and thought content normal.  Nursing note and vitals reviewed.   ED Course  Procedures (including critical care time) Labs Review Labs Reviewed  BASIC METABOLIC PANEL - Abnormal; Notable for the following:    Potassium 5.2 (*)    Glucose, Bld 117 (*)    BUN 29 (*)    GFR calc non Af Amer 60 (*)    All other components within normal limits  CBC WITH  DIFFERENTIAL/PLATELET  RAPID HIV SCREEN (HIV 1/2 AB+AG)  HEPATITIS B SURFACE ANTIGEN  HEPATITIS C ANTIBODY (REFLEX)    Imaging Review Dg Chest 2 View  11/12/2015  CLINICAL DATA:  Hilum fluid fell on top of chest and left shoulder approximately 4 p.m. today. Chest pain and shortness of breath. Unable to move left arm. EXAM: CHEST  2 VIEW COMPARISON:  05/24/2014. FINDINGS: Lungs are hyperexpanded. There is a left-sided pneumothorax, with no pleural line visible on the frontal projection but pleural line clearly seen on the lateral film. Multiple left upper rib fractures are identified involvement at least the left first through third ribs. Plural irregularity underlying the rib fractures suggests hemorrhage. Small volume of subcutaneous emphysema is seen. There is some atelectasis at the left lung base. Cardiopericardial silhouette is at upper limits of normal for size. Small hiatal hernia noted. IMPRESSION: Multiple left upper rib fractures with associated left pneumothorax and subcutaneous  emphysema. Critical Value/emergent results were called by me at the time of interpretation on 11/12/2015 at 6:25 pm to Dr. Brantley Stage , who verbally acknowledged these results. Electronically Signed   By: Misty Stanley M.D.   On: 11/12/2015 18:25   Dg Chest Port 1 View  11/12/2015  CLINICAL DATA:  Chest tube placement. Assess residual pneumothorax. Initial encounter. EXAM: PORTABLE CHEST 1 VIEW COMPARISON:  Chest radiograph performed earlier today at 7:54 p.m. FINDINGS: Status post placement of a left apical chest tube, the previously noted pneumothorax has resolved. Patchy left basilar and mid lung airspace opacities likely reflect atelectasis. Minimal right basilar atelectasis is noted. Vascular congestion is seen. No significant pleural effusion is identified. The cardiomediastinal silhouette is borderline normal in size. Multiple displaced left-sided rib fractures are again noted. Scattered soft tissue air is noted  along the left chest wall, perhaps mildly improved from the prior study. IMPRESSION: 1. Interval resolution of pneumothorax, status post placement of left apical chest tube. Patchy left basilar and left midlung airspace opacities likely reflect atelectasis. Minimal right basilar atelectasis noted. Vascular congestion seen. 2. Multiple displaced left-sided rib fractures again noted. Electronically Signed   By: Garald Balding M.D.   On: 11/12/2015 21:38   Dg Chest Portable 1 View  11/12/2015  CLINICAL DATA:  Post decompression of left lung EXAM: PORTABLE CHEST 1 VIEW COMPARISON:  Prior film same day FINDINGS: Multiple left upper rib fractures again noted. There is progression of subcutaneous emphysema left upper chest wall. There is progression of the left pneumothorax at least 45-50%. Atelectasis of left lung. Right lung is clear. IMPRESSION: There is progression of subcutaneous emphysema left upper chest wall. There is progression of the left pneumothorax at least 45-50%. Atelectasis of left lung. Multiple left upper rib fractures again noted. These results were called by telephone at the time of interpretation on 11/12/2015 at 8:32 pm to Dr. Brantley Stage , who verbally acknowledged these results. Electronically Signed   By: Lahoma Crocker M.D.   On: 11/12/2015 20:32   Dg Shoulder Left  11/12/2015  CLINICAL DATA:  Left shoulder pain, a pile of wood fell on left shoulder and left chest this after known 4 p.m. EXAM: LEFT SHOULDER - 2+ VIEW COMPARISON:  None. FINDINGS: Two views of the left shoulder submitted. No shoulder dislocation. Displaced fracture of the left first, second and third rib. There is nondisplaced fracture of the left third and fourth rib in axillary region. Mild displaced fracture of the left fifth and sixth rib. Subcutaneous emphysema is noted left axilla. There is left upper pneumothorax at least 30%. IMPRESSION: No shoulder dislocation. Displaced fracture of the left first, second and third rib.  Additional fracture of the left fourth fifth and probable sixth rib. There is at least 30% left upper pneumothorax. Subcutaneous emphysema noted left axilla. These results were called by telephone at the time of interpretation on 11/12/2015 at 6:31 pm to Dr. Brantley Stage , who verbally acknowledged these results. Electronically Signed   By: Lahoma Crocker M.D.   On: 11/12/2015 18:31   I have personally reviewed and evaluated these images and lab results as part of my medical decision-making.   EKG Interpretation None      MDM   Final diagnoses:  Injury  Rib fractures, left, closed, initial encounter  Pneumothorax    Patient transferred from [facility] for evaluation of left-sided pneumothorax. Patient was moving wood today with a large amount fell on his left shoulder area he suffered  6 left-sided rib fractures and left-sided pneumothorax. Needle decompression attempted prior to transfer.  Patient presented note of speak full sentences and had decreased sounds on left.   No trauma to head, c-spine.  Abdomen soft, non-tender.   Trauma surgery consulted, to bedside.  Performed left sided chest tube.  XR w/ resolution of PTX following chest tube.   No other injuries apparent.  Admitted to trauma team.   Discussed with Dr. Ralene Bathe.      Vira Blanco, MD 11/12/15 2256  Quintella Reichert, MD 11/13/15 1452

## 2015-11-12 NOTE — ED Notes (Signed)
Received pt from [place], pt alert, awake and in NAD, VSS, Dr Lemar Livings paged, Dr Ralene Bathe to bedside

## 2015-11-12 NOTE — Progress Notes (Signed)
Pt admitted to the unit with famliy. Pt is stable, alert and oriented per baseline. Oriented to room, staff, and call bell. Educated to call for any assistance. Bed in lowest position, call bell within reach- will continue to monitor.

## 2015-11-13 ENCOUNTER — Inpatient Hospital Stay (HOSPITAL_COMMUNITY): Payer: Medicare Other

## 2015-11-13 ENCOUNTER — Encounter (HOSPITAL_COMMUNITY): Payer: Self-pay | Admitting: General Practice

## 2015-11-13 DIAGNOSIS — S298XXA Other specified injuries of thorax, initial encounter: Secondary | ICD-10-CM | POA: Diagnosis present

## 2015-11-13 DIAGNOSIS — S272XXA Traumatic hemopneumothorax, initial encounter: Secondary | ICD-10-CM | POA: Diagnosis present

## 2015-11-13 MED ORDER — FERROUS SULFATE 325 (65 FE) MG PO TABS
325.0000 mg | ORAL_TABLET | Freq: Every day | ORAL | Status: DC
  Administered 2015-11-13 – 2015-11-15 (×3): 325 mg via ORAL
  Filled 2015-11-13 (×3): qty 1

## 2015-11-13 MED ORDER — ASPIRIN EC 81 MG PO TBEC
81.0000 mg | DELAYED_RELEASE_TABLET | Freq: Every day | ORAL | Status: DC
  Administered 2015-11-13 – 2015-11-15 (×3): 81 mg via ORAL
  Filled 2015-11-13 (×3): qty 1

## 2015-11-13 MED ORDER — POLYETHYLENE GLYCOL 3350 17 G PO PACK
17.0000 g | PACK | Freq: Every day | ORAL | Status: DC
  Administered 2015-11-13 – 2015-11-15 (×3): 17 g via ORAL
  Filled 2015-11-13 (×3): qty 1

## 2015-11-13 MED ORDER — TRAZODONE HCL 150 MG PO TABS
150.0000 mg | ORAL_TABLET | Freq: Every day | ORAL | Status: DC
  Administered 2015-11-13 – 2015-11-14 (×2): 150 mg via ORAL
  Filled 2015-11-13 (×2): qty 1

## 2015-11-13 MED ORDER — VITAMIN C 500 MG PO TABS
1000.0000 mg | ORAL_TABLET | Freq: Every day | ORAL | Status: DC
  Administered 2015-11-13 – 2015-11-15 (×3): 1000 mg via ORAL
  Filled 2015-11-13 (×3): qty 2

## 2015-11-13 MED ORDER — DOCUSATE SODIUM 100 MG PO CAPS
100.0000 mg | ORAL_CAPSULE | Freq: Two times a day (BID) | ORAL | Status: DC
  Administered 2015-11-13 – 2015-11-15 (×5): 100 mg via ORAL
  Filled 2015-11-13 (×5): qty 1

## 2015-11-13 MED ORDER — HYDROMORPHONE HCL 1 MG/ML IJ SOLN
0.5000 mg | INTRAMUSCULAR | Status: DC | PRN
  Administered 2015-11-14: 0.5 mg via INTRAVENOUS
  Filled 2015-11-13: qty 1

## 2015-11-13 MED ORDER — HYDROCODONE-ACETAMINOPHEN 10-325 MG PO TABS
0.5000 | ORAL_TABLET | ORAL | Status: DC | PRN
  Administered 2015-11-13 – 2015-11-14 (×6): 2 via ORAL
  Administered 2015-11-15 (×3): 1 via ORAL
  Filled 2015-11-13: qty 1
  Filled 2015-11-13 (×4): qty 2
  Filled 2015-11-13: qty 1
  Filled 2015-11-13: qty 2
  Filled 2015-11-13: qty 1
  Filled 2015-11-13: qty 2

## 2015-11-13 NOTE — Progress Notes (Signed)
Patient reported that the last time he remembered urinating was yesterday around 3pm. RN bladder scanned patient this morning and found 160cc of residual. Patient stated that he has not been drinking a lot and can urinate on his own once he drinks water. Will continue to monitor

## 2015-11-13 NOTE — Progress Notes (Signed)
Patient vomited twice- dark brown emesis. Zofran given.

## 2015-11-13 NOTE — Care Management Note (Signed)
Case Management Note  Patient Details  Name: AGASTYA LUPE MRN: [id number] Date of Birth: [birth year]  Subjective/Objective:    Pt admitted on 11/12/15 s/p blunt trauma to ribs with rib fractures and PTX.  PTA, pt independent, lives with spouse.                  Action/Plan: Will follow for discharge planning as pt progresses.    Expected Discharge Date:                  Expected Discharge Plan:  Home/Self Care  In-House Referral:     Discharge planning Services  CM Consult  Post Acute Care Choice:    Choice offered to:     DME Arranged:    DME Agency:     HH Arranged:    HH Agency:     Status of Service:  In process, will continue to follow  Medicare Important Message Given:    Date Medicare IM Given:    Medicare IM give by:    Date Additional Medicare IM Given:    Additional Medicare Important Message give by:     If discussed at [place] of Stay Meetings, dates discussed:    Additional Comments:  Reinaldo Raddle, RN, BSN  Trauma/Neuro ICU Case Manager [phone]

## 2015-11-13 NOTE — Progress Notes (Signed)
Patient ID: Harold Leach, male   DOB: 10-04-1940, 75 y.o.   MRN: [id number]   LOS: 1 day   Subjective: Doing well, pain controlled, no SOB.   Objective: Vital signs in last 24 hours: Temp:  [97.5 F (36.4 C)-98.9 F (37.2 C)] 97.5 F (36.4 C) (02/21 0501) Pulse Rate:  [53-73] 73 (02/21 0501) Resp:  [14-25] 15 (02/21 0501) BP: (119-158)/(62-98) 124/67 mmHg (02/21 0501) SpO2:  [94 %-100 %] 94 % (02/21 0501) Weight:  [72.576 kg (160 lb)-75.6 kg (166 lb 10.7 oz)] 75.6 kg (166 lb 10.7 oz) (02/20 2356) Last BM Date: 11/12/15   IS: 1075ml   CT No air leak 27ml OP   Radiology Results PORTABLE CHEST 1 VIEW  COMPARISON: 11/12/2015.  FINDINGS: Left chest tube in stable position. No pneumothorax. Mediastinum hilar structures are unremarkable. Persistent left base atelectatic changes. Mild right base subsegmental atelectasis. Left chest wall subcutaneous emphysema again noted.  IMPRESSION: 1. Left chest tube in stable position. No pneumothorax. Left chest wall subcutaneous emphysema stable. 2. Persistent bibasilar atelectasis particular on the left lung base.   Electronically Signed  By: Marcello Moores Register  On: 11/13/2015 07:30   Physical Exam General appearance: alert and no distress Resp: clear to auscultation bilaterally Cardio: regular rate and rhythm GI: normal findings: bowel sounds normal and soft, non-tender   Assessment/Plan: Blunt chest trauma Multiple left rib fxs w/PTX s/p CT -- CT not on suction but no air leak or PTX on CXR so will leave it to water seal. Consider removal tomorrow. FEN -- No issues VTE -- SCD's, Lovenox Dispo -- CT    Lisette Abu, PA-C Pager: [phone] General Trauma PA Pager: [phone]  11/13/2015

## 2015-11-14 ENCOUNTER — Inpatient Hospital Stay (HOSPITAL_COMMUNITY): Payer: Medicare Other

## 2015-11-14 LAB — HEPATITIS C ANTIBODY (REFLEX)

## 2015-11-14 LAB — HCV COMMENT:

## 2015-11-14 LAB — HEPATITIS B SURFACE ANTIGEN: HEP B S AG: NEGATIVE

## 2015-11-14 MED ORDER — HYDROCODONE-ACETAMINOPHEN 10-325 MG PO TABS: 1.0000 | ORAL_TABLET | ORAL | Status: DC | PRN

## 2015-11-14 NOTE — Progress Notes (Signed)
Patient had emesis, saved for doctor to see. MD notified.

## 2015-11-14 NOTE — Progress Notes (Signed)
[facility] Progress Note     Subjective: Doing well this morning. States he has had hiccups that will not go away. Reports two episodes of dark brown emesis yesterday. States he had minimal nausea. Nurse reports restless leg issues in right leg. This happens at home as well. Denies SOB, abd pain, constipation, or diarrhea. Pain controlled.  Objective: Vital signs in last 24 hours: Temp:  [97.9 F (36.6 C)-98.7 F (37.1 C)] 97.9 F (36.6 C) (02/22 0648) Pulse Rate:  [52-63] 63 (02/22 0648) Resp:  [16-18] 18 (02/22 0648) BP: (121-163)/(64-67) 124/64 mmHg (02/22 0648) SpO2:  [90 %-91 %] 90 % (02/22 0648) Last BM Date: 11/12/15  Intake/Output from previous day: 02/21 0701 - 02/22 0700 In: 1060 [P.O.:1060] Out: 63 [Chest Tube:50] Intake/Output this shift:    PE: Gen:  Alert, NAD, pleasant Card:  RRR, no M/G/R heard Pulm:  CTA, no W/R/R; CT on water seal, No air leak; output last shift 31mL Abd: Soft, NT/ND, +BS, no HSM Ext:  No erythema, edema, or tenderness, + peripheral pulses  Lab Results:   Recent Labs  11/12/15 1800  WBC 7.0  HGB 14.3  HCT 42.7  PLT 177   BMET  Recent Labs  11/12/15 1800  NA 140  K 5.2*  CL 106  CO2 28  GLUCOSE 117*  BUN 29*  CREATININE 1.18  CALCIUM 8.9   PT/INR No results for input(s): LABPROT, INR in the last 72 hours. CMP     Component Value Date/Time   NA 140 11/12/2015 1800   K 5.2* 11/12/2015 1800   CL 106 11/12/2015 1800   CO2 28 11/12/2015 1800   GLUCOSE 117* 11/12/2015 1800   BUN 29* 11/12/2015 1800   CREATININE 1.18 11/12/2015 1800   CALCIUM 8.9 11/12/2015 1800   GFRNONAA 60* 11/12/2015 1800   GFRAA >60 11/12/2015 1800   Lipase  No results found for: LIPASE  Studies/Results: Dg Chest 2 View  11/12/2015  CLINICAL DATA:  Hilum fluid fell on top of chest and left shoulder approximately 4 p.m. today. Chest pain and shortness of breath. Unable to move left arm. EXAM: CHEST  2 VIEW COMPARISON:   05/24/2014. FINDINGS: Lungs are hyperexpanded. There is a left-sided pneumothorax, with no pleural line visible on the frontal projection but pleural line clearly seen on the lateral film. Multiple left upper rib fractures are identified involvement at least the left first through third ribs. Plural irregularity underlying the rib fractures suggests hemorrhage. Small volume of subcutaneous emphysema is seen. There is some atelectasis at the left lung base. Cardiopericardial silhouette is at upper limits of normal for size. Small hiatal hernia noted. IMPRESSION: Multiple left upper rib fractures with associated left pneumothorax and subcutaneous emphysema. Critical Value/emergent results were called by me at the time of interpretation on 11/12/2015 at 6:25 pm to Dr. Brantley Stage , who verbally acknowledged these results. Electronically Signed   By: Misty Stanley M.D.   On: 11/12/2015 18:25   Dg Chest Port 1 View  11/14/2015  CLINICAL DATA:  Traumatic left-sided hemo pneumothorax with chest tube treatment. EXAM: PORTABLE CHEST 1 VIEW COMPARISON:  Portable chest x-ray of November 13, 2015 FINDINGS: There is an approximately 5% left apical pneumothorax. The apical pleural line is better demonstrated today. The chest tube tip lies immediately adjacent to this. Fractures of the first, second, and possibly third ribs are visible on the left. There is minimal left basilar atelectasis. There is similar atelectasis on the right. These findings are  stable. There is no right-sided pneumothorax. The cardiac silhouette is enlarged. The central pulmonary vascularity is mildly prominent. IMPRESSION: 5% or less right apical pneumothorax is visible today. There is stable bibasilar subsegmental atelectasis. Known fractures of the posterior aspects of the first, second, and possibly third ribs on the left. Electronically Signed   By: David  Martinique M.D.   On: 11/14/2015 07:30   Dg Chest Port 1 View  11/13/2015  CLINICAL DATA:  Left  chest tube. EXAM: PORTABLE CHEST 1 VIEW COMPARISON:  11/12/2015. FINDINGS: Left chest tube in stable position. No pneumothorax. Mediastinum hilar structures are unremarkable. Persistent left base atelectatic changes. Mild right base subsegmental atelectasis. Left chest wall subcutaneous emphysema again noted. IMPRESSION: 1. Left chest tube in stable position. No pneumothorax. Left chest wall subcutaneous emphysema stable. 2. Persistent bibasilar atelectasis particular on the left lung base. Electronically Signed   By: [place]   On: 11/13/2015 07:30   Dg Chest Port 1 View  11/12/2015  CLINICAL DATA:  Chest tube placement. Assess residual pneumothorax. Initial encounter. EXAM: PORTABLE CHEST 1 VIEW COMPARISON:  Chest radiograph performed earlier today at 7:54 p.m. FINDINGS: Status post placement of a left apical chest tube, the previously noted pneumothorax has resolved. Patchy left basilar and mid lung airspace opacities likely reflect atelectasis. Minimal right basilar atelectasis is noted. Vascular congestion is seen. No significant pleural effusion is identified. The cardiomediastinal silhouette is borderline normal in size. Multiple displaced left-sided rib fractures are again noted. Scattered soft tissue air is noted along the left chest wall, perhaps mildly improved from the prior study. IMPRESSION: 1. Interval resolution of pneumothorax, status post placement of left apical chest tube. Patchy left basilar and left midlung airspace opacities likely reflect atelectasis. Minimal right basilar atelectasis noted. Vascular congestion seen. 2. Multiple displaced left-sided rib fractures again noted. Electronically Signed   By: Garald Balding M.D.   On: 11/12/2015 21:38   Dg Chest Portable 1 View  11/12/2015  CLINICAL DATA:  Post decompression of left lung EXAM: PORTABLE CHEST 1 VIEW COMPARISON:  Prior film same day FINDINGS: Multiple left upper rib fractures again noted. There is progression of  subcutaneous emphysema left upper chest wall. There is progression of the left pneumothorax at least 45-50%. Atelectasis of left lung. Right lung is clear. IMPRESSION: There is progression of subcutaneous emphysema left upper chest wall. There is progression of the left pneumothorax at least 45-50%. Atelectasis of left lung. Multiple left upper rib fractures again noted. These results were called by telephone at the time of interpretation on 11/12/2015 at 8:32 pm to Dr. Brantley Stage , who verbally acknowledged these results. Electronically Signed   By: Lahoma Crocker M.D.   On: 11/12/2015 20:32   Dg Shoulder Left  11/12/2015  CLINICAL DATA:  Left shoulder pain, a pile of wood fell on left shoulder and left chest this after known 4 p.m. EXAM: LEFT SHOULDER - 2+ VIEW COMPARISON:  None. FINDINGS: Two views of the left shoulder submitted. No shoulder dislocation. Displaced fracture of the left first, second and third rib. There is nondisplaced fracture of the left third and fourth rib in axillary region. Mild displaced fracture of the left fifth and sixth rib. Subcutaneous emphysema is noted left axilla. There is left upper pneumothorax at least 30%. IMPRESSION: No shoulder dislocation. Displaced fracture of the left first, second and third rib. Additional fracture of the left fourth fifth and probable sixth rib. There is at least 30% left upper pneumothorax. Subcutaneous emphysema noted  left axilla. These results were called by telephone at the time of interpretation on 11/12/2015 at 6:31 pm to Dr. Brantley Stage , who verbally acknowledged these results. Electronically Signed   By: Lahoma Crocker M.D.   On: 11/12/2015 18:31   Assessment/Plan  Blunt chest trauma Multiple left rib fxs w/PTX s/p CT -- CT on water seal; minimal output; Possible CT removal today.  FEN -- No issues VTE -- SCD's, Lovenox Dispo -- CT removal today  LOS: 2 days    Lehman Prom 11/14/2015, 7:39 AM Pager: [phone]

## 2015-11-14 NOTE — Discharge Instructions (Signed)
Remove chest dressing on Friday, then Wash wounds daily in shower with soap and water. Do not soak. Apply antibiotic ointment (e.g. Neosporin) twice daily and as needed to keep moist. Cover with dry dressing.  No driving while taking hydrocodone.

## 2015-11-14 NOTE — Discharge Summary (Signed)
Patient ID: Harold Leach MRN: [id number] DOB/AGE: 73-19-1942 73 y.o.  Admit date: 11/12/2015 Discharge date: 11/15/2015  Admitting Diagnosis: Rib fractures, left, closed, initial encounter Pneumothorax  Discharge Diagnosis Patient Active Problem List   Diagnosis Date Noted  . Blunt chest trauma 11/13/2015  . Traumatic hemopneumothorax 11/13/2015  . Rib fractures 11/12/2015  . Right inguinal hernia 03/20/2014    Consultants None  Imaging: Dg Chest 2 View  11/12/2015  CLINICAL DATA:  Hilum fluid fell on top of chest and left shoulder approximately 4 p.m. today. Chest pain and shortness of breath. Unable to move left arm. EXAM: CHEST  2 VIEW COMPARISON:  05/24/2014. FINDINGS: Lungs are hyperexpanded. There is a left-sided pneumothorax, with no pleural line visible on the frontal projection but pleural line clearly seen on the lateral film. Multiple left upper rib fractures are identified involvement at least the left first through third ribs. Plural irregularity underlying the rib fractures suggests hemorrhage. Small volume of subcutaneous emphysema is seen. There is some atelectasis at the left lung base. Cardiopericardial silhouette is at upper limits of normal for size. Small hiatal hernia noted. IMPRESSION: Multiple left upper rib fractures with associated left pneumothorax and subcutaneous emphysema. Critical Value/emergent results were called by me at the time of interpretation on 11/12/2015 at 6:25 pm to Dr. Brantley Stage , who verbally acknowledged these results. Electronically Signed   By: Misty Stanley M.D.   On: 11/12/2015 18:25   Dg Chest Port 1 View  11/12/2015  CLINICAL DATA:  Chest tube placement. Assess residual pneumothorax. Initial encounter. EXAM: PORTABLE CHEST 1 VIEW COMPARISON:  Chest radiograph performed earlier today at 7:54 p.m. FINDINGS: Status post placement of a left apical chest tube, the previously noted pneumothorax has resolved. Patchy left basilar and mid lung  airspace opacities likely reflect atelectasis. Minimal right basilar atelectasis is noted. Vascular congestion is seen. No significant pleural effusion is identified. The cardiomediastinal silhouette is borderline normal in size. Multiple displaced left-sided rib fractures are again noted. Scattered soft tissue air is noted along the left chest wall, perhaps mildly improved from the prior study. IMPRESSION: 1. Interval resolution of pneumothorax, status post placement of left apical chest tube. Patchy left basilar and left midlung airspace opacities likely reflect atelectasis. Minimal right basilar atelectasis noted. Vascular congestion seen. 2. Multiple displaced left-sided rib fractures again noted. Electronically Signed   By: Garald Balding M.D.   On: 11/12/2015 21:38   Dg Chest Portable 1 View  11/12/2015  CLINICAL DATA:  Post decompression of left lung EXAM: PORTABLE CHEST 1 VIEW COMPARISON:  Prior film same day FINDINGS: Multiple left upper rib fractures again noted. There is progression of subcutaneous emphysema left upper chest wall. There is progression of the left pneumothorax at least 45-50%. Atelectasis of left lung. Right lung is clear. IMPRESSION: There is progression of subcutaneous emphysema left upper chest wall. There is progression of the left pneumothorax at least 45-50%. Atelectasis of left lung. Multiple left upper rib fractures again noted. These results were called by telephone at the time of interpretation on 11/12/2015 at 8:32 pm to Dr. Brantley Stage , who verbally acknowledged these results. Electronically Signed   By: Lahoma Crocker M.D.   On: 11/12/2015 20:32   Dg Shoulder Left  11/12/2015  CLINICAL DATA:  Left shoulder pain, a pile of wood fell on left shoulder and left chest this after known 4 p.m. EXAM: LEFT SHOULDER - 2+ VIEW COMPARISON:  None. FINDINGS: Two views of the left shoulder submitted.  No shoulder dislocation. Displaced fracture of the left first, second and third rib. There is  nondisplaced fracture of the left third and fourth rib in axillary region. Mild displaced fracture of the left fifth and sixth rib. Subcutaneous emphysema is noted left axilla. There is left upper pneumothorax at least 30%. IMPRESSION: No shoulder dislocation. Displaced fracture of the left first, second and third rib. Additional fracture of the left fourth fifth and probable sixth rib. There is at least 30% left upper pneumothorax. Subcutaneous emphysema noted left axilla. These results were called by telephone at the time of interpretation on 11/12/2015 at 6:31 pm to Dr. Brantley Stage , who verbally acknowledged these results. Electronically Signed   By: Lahoma Crocker M.D.   On: 11/12/2015 18:31    Dg Chest Port 1 View  11/13/2015  CLINICAL DATA:  Left chest tube. EXAM: PORTABLE CHEST 1 VIEW COMPARISON:  11/12/2015. FINDINGS: Left chest tube in stable position. No pneumothorax. Mediastinum hilar structures are unremarkable. Persistent left base atelectatic changes. Mild right base subsegmental atelectasis. Left chest wall subcutaneous emphysema again noted. IMPRESSION: 1. Left chest tube in stable position. No pneumothorax. Left chest wall subcutaneous emphysema stable. 2. Persistent bibasilar atelectasis particular on the left lung base. Electronically Signed   By: [place]   On: 11/13/2015 07:30   Dg Chest Port 1 View  11/14/2015  CLINICAL DATA:  Chest tube removal.  Evaluate for pneumothorax. EXAM: PORTABLE CHEST 1 VIEW COMPARISON:  11/14/2015 FINDINGS: Left chest tube has been removed. The small left apical pneumothorax has not changed in size. Again noted is subcutaneous gas. Bibasilar densities are suggestive for atelectasis. Cannot exclude right pleural fluid. Heart and mediastinum are stable. The trachea is midline. IMPRESSION: Removal of left chest tube.  Stable small left apical pneumothorax. Probable bibasilar atelectasis. Cannot exclude a right pleural effusion. Electronically Signed   By: Markus Daft M.D.   On: 11/14/2015 14:00   Dg Chest Port 1 View  11/14/2015  CLINICAL DATA:  Traumatic left-sided hemo pneumothorax with chest tube treatment. EXAM: PORTABLE CHEST 1 VIEW COMPARISON:  Portable chest x-ray of November 13, 2015 FINDINGS: There is an approximately 5% left apical pneumothorax. The apical pleural line is better demonstrated today. The chest tube tip lies immediately adjacent to this. Fractures of the first, second, and possibly third ribs are visible on the left. There is minimal left basilar atelectasis. There is similar atelectasis on the right. These findings are stable. There is no right-sided pneumothorax. The cardiac silhouette is enlarged. The central pulmonary vascularity is mildly prominent. IMPRESSION: 5% or less right apical pneumothorax is visible today. There is stable bibasilar subsegmental atelectasis. Known fractures of the posterior aspects of the first, second, and possibly third ribs on the left. Electronically Signed   By: David  Martinique M.D.   On: 11/14/2015 07:30   Procedures None  Hospital Course:  75 yo white male presented to [facility] ED and transferred to [facility] on 11/12/2015 with SOB, sudden left side chest wall pain and left shoulder pain after multiple sheets of plywood fell against him. He did not fall, have head injury, or episode of LOC. CXR revealed left side rib fractures and left pneumothorax. He had needle decompression of left side at [facility] and then chest tube insertion in the [facility] ED.  Chest tube d/c after minimal drainage for past 24 hours on 11/14/2015. CXR confirmed removal and stable small left apical pneumothorax and stable small right pleural effusion. Patients vitals  are stable, he is ambulating, voiding well, and tolerating regular diet. The patient ended up staying 1 day due to some nausea and emesis. This had resolved by the day of discharge; he was discharged on a PPI for additional treatment.      Medication List    TAKE  these medications        aspirin 81 MG tablet  Take 81 mg by mouth daily.     Biotin 1000 MCG tablet  Take 5,000 mcg by mouth daily.     ferrous sulfate 325 (65 FE) MG tablet  Take 325 mg by mouth daily with breakfast.     Fish Oil 645 MG Caps  Take 1,000 mg by mouth daily.     FLORA-Q Caps capsule  Take 1 capsule by mouth daily.     HYDROcodone-acetaminophen 10-325 MG tablet  Commonly known as:  NORCO  Take 1-2 tablets by mouth every 4 (four) hours as needed (pain).     ICAPS AREDS FORMULA PO  Take 2 capsules by mouth daily.     magnesium gluconate 30 MG tablet  Commonly known as:  MAGONATE  Take 400 mg by mouth daily.     pantoprazole 40 MG tablet  Commonly known as:  PROTONIX  Take 1 tablet (40 mg total) by mouth daily.     traZODone 150 MG tablet  Commonly known as:  DESYREL  Take 150 mg by mouth at bedtime.     vitamin C 500 MG tablet  Commonly known as:  ASCORBIC ACID  Take 1,000 mg by mouth daily.             Follow-up Information    Call [place].   Why:  As needed   Contact information:   [address] [id number] [place] [phone]      Signed: Lehman Prom PA-SII Physician Assistant Student, [facility] [phone]   11/14/2015, 3:06 PM

## 2015-11-15 MED ORDER — PANTOPRAZOLE SODIUM 40 MG PO TBEC: 40.0000 mg | DELAYED_RELEASE_TABLET | Freq: Every day | ORAL | Status: DC

## 2015-11-15 MED ORDER — ONDANSETRON HCL 4 MG PO TABS: 4.0000 mg | ORAL_TABLET | Freq: Four times a day (QID) | ORAL | Status: DC | PRN

## 2015-11-15 NOTE — Progress Notes (Signed)
Patient stated that the doctor had already seen his emesis.  No nausea noted at this time and pain at 2/10 but pain higher at left ribs with movement.

## 2015-11-15 NOTE — Care Management Important Message (Signed)
Important Message  Patient Details  Name: Harold Leach MRN: [id number] Date of Birth: [birth year]   Medicare Important Message Given:  Yes    Barb Merino [name] 11/15/2015, 4:40 PM

## 2015-11-15 NOTE — Progress Notes (Signed)
[facility] Progress Note     Subjective: No further N/V this morning. Complaints of Reflux symptoms over night. Increased Left shoulder pain today. Denies SOB, N/V, Abd Pain, Constipation/diarrhea. No BM since admission, urinating fine.  Objective: Vital signs in last 24 hours: Temp:  [98 F (36.7 C)-98.5 F (36.9 C)] 98 F (36.7 C) (02/23 0554) Pulse Rate:  [70-74] 70 (02/23 0554) Resp:  [18] 18 (02/23 0554) BP: (125-142)/(70-74) 142/74 mmHg (02/23 0554) SpO2:  [91 %-99 %] 91 % (02/23 0554) Last BM Date: 11/12/15  Intake/Output from previous day: 02/22 0701 - 02/23 0700 In: 780 [P.O.:780] Out: -  Intake/Output this shift:    PE: Gen:  Alert, NAD, pleasant Card:  RRR, no M/G/R heard Pulm:  CTAB, no W/R/R; appropriate pain with deep inspiration on left side; IS pulled 1000 Abd: Soft, NT/ND, +BS, no HSM Ext:  No erythema, edema, or tenderness; Left shoulder decreased ROM and pain with movement.  Lab Results:   Recent Labs  11/12/15 1800  WBC 7.0  HGB 14.3  HCT 42.7  PLT 177   BMET  Recent Labs  11/12/15 1800  NA 140  K 5.2*  CL 106  CO2 28  GLUCOSE 117*  BUN 29*  CREATININE 1.18  CALCIUM 8.9   PT/INR No results for input(s): LABPROT, INR in the last 72 hours. CMP     Component Value Date/Time   NA 140 11/12/2015 1800   K 5.2* 11/12/2015 1800   CL 106 11/12/2015 1800   CO2 28 11/12/2015 1800   GLUCOSE 117* 11/12/2015 1800   BUN 29* 11/12/2015 1800   CREATININE 1.18 11/12/2015 1800   CALCIUM 8.9 11/12/2015 1800   GFRNONAA 60* 11/12/2015 1800   GFRAA >60 11/12/2015 1800   Lipase  No results found for: LIPASE  Studies/Results: Dg Chest Port 1 View  11/14/2015  CLINICAL DATA:  Chest tube removal.  Evaluate for pneumothorax. EXAM: PORTABLE CHEST 1 VIEW COMPARISON:  11/14/2015 FINDINGS: Left chest tube has been removed. The small left apical pneumothorax has not changed in size. Again noted is subcutaneous gas. Bibasilar densities are  suggestive for atelectasis. Cannot exclude right pleural fluid. Heart and mediastinum are stable. The trachea is midline. IMPRESSION: Removal of left chest tube.  Stable small left apical pneumothorax. Probable bibasilar atelectasis. Cannot exclude a right pleural effusion. Electronically Signed   By: Markus Daft M.D.   On: 11/14/2015 14:00   Dg Chest Port 1 View  11/14/2015  CLINICAL DATA:  Traumatic left-sided hemo pneumothorax with chest tube treatment. EXAM: PORTABLE CHEST 1 VIEW COMPARISON:  Portable chest x-ray of November 13, 2015 FINDINGS: There is an approximately 5% left apical pneumothorax. The apical pleural line is better demonstrated today. The chest tube tip lies immediately adjacent to this. Fractures of the first, second, and possibly third ribs are visible on the left. There is minimal left basilar atelectasis. There is similar atelectasis on the right. These findings are stable. There is no right-sided pneumothorax. The cardiac silhouette is enlarged. The central pulmonary vascularity is mildly prominent. IMPRESSION: 5% or less right apical pneumothorax is visible today. There is stable bibasilar subsegmental atelectasis. Known fractures of the posterior aspects of the first, second, and possibly third ribs on the left. Electronically Signed   By: David  Martinique M.D.   On: 11/14/2015 07:30    Assessment/Plan  Blunt chest trauma Multiple left rib fxs w/PTX s/p CT -- CT removal yesterday; Minimal PTX on CXR yesterday GERD -- oral protonix  FEN -- No issues VTE -- SCD's, Lovenox Dispo -- CT removed yesterday; d/c today with oral protonix and pain medication; Outpatient PT for left shoulder pain  LOS: 3 days    Lehman Prom, PA-SII 11/15/2015, 7:24 AM Pager: [phone]

## 2015-11-28 DIAGNOSIS — E785 Hyperlipidemia, unspecified: Secondary | ICD-10-CM | POA: Insufficient documentation

## 2015-11-28 DIAGNOSIS — F5104 Psychophysiologic insomnia: Secondary | ICD-10-CM | POA: Diagnosis not present

## 2015-11-28 DIAGNOSIS — K219 Gastro-esophageal reflux disease without esophagitis: Secondary | ICD-10-CM | POA: Diagnosis not present

## 2015-11-28 DIAGNOSIS — K921 Melena: Secondary | ICD-10-CM | POA: Diagnosis not present

## 2015-11-28 DIAGNOSIS — R799 Abnormal finding of blood chemistry, unspecified: Secondary | ICD-10-CM | POA: Diagnosis not present

## 2015-11-28 HISTORY — DX: Hyperlipidemia, unspecified: E78.5

## 2015-11-29 DIAGNOSIS — R799 Abnormal finding of blood chemistry, unspecified: Secondary | ICD-10-CM | POA: Diagnosis not present

## 2015-11-29 DIAGNOSIS — K921 Melena: Secondary | ICD-10-CM | POA: Diagnosis not present

## 2015-12-25 DIAGNOSIS — S46012A Strain of muscle(s) and tendon(s) of the rotator cuff of left shoulder, initial encounter: Secondary | ICD-10-CM | POA: Diagnosis not present

## 2016-01-02 DIAGNOSIS — K921 Melena: Secondary | ICD-10-CM | POA: Diagnosis not present

## 2016-01-02 DIAGNOSIS — R1310 Dysphagia, unspecified: Secondary | ICD-10-CM | POA: Diagnosis not present

## 2016-01-02 DIAGNOSIS — Z1211 Encounter for screening for malignant neoplasm of colon: Secondary | ICD-10-CM | POA: Diagnosis not present

## 2016-01-07 DIAGNOSIS — M25512 Pain in left shoulder: Secondary | ICD-10-CM | POA: Diagnosis not present

## 2016-01-17 DIAGNOSIS — D125 Benign neoplasm of sigmoid colon: Secondary | ICD-10-CM | POA: Diagnosis not present

## 2016-01-17 DIAGNOSIS — R1310 Dysphagia, unspecified: Secondary | ICD-10-CM | POA: Diagnosis not present

## 2016-01-17 DIAGNOSIS — K921 Melena: Secondary | ICD-10-CM | POA: Diagnosis not present

## 2016-01-17 DIAGNOSIS — K635 Polyp of colon: Secondary | ICD-10-CM | POA: Diagnosis not present

## 2016-01-17 DIAGNOSIS — K449 Diaphragmatic hernia without obstruction or gangrene: Secondary | ICD-10-CM | POA: Diagnosis not present

## 2016-01-17 DIAGNOSIS — K5730 Diverticulosis of large intestine without perforation or abscess without bleeding: Secondary | ICD-10-CM | POA: Diagnosis not present

## 2016-01-17 DIAGNOSIS — K209 Esophagitis, unspecified: Secondary | ICD-10-CM | POA: Diagnosis not present

## 2016-01-17 DIAGNOSIS — R195 Other fecal abnormalities: Secondary | ICD-10-CM | POA: Diagnosis not present

## 2016-01-17 DIAGNOSIS — K208 Other esophagitis: Secondary | ICD-10-CM | POA: Diagnosis not present

## 2016-01-17 DIAGNOSIS — K5140 Inflammatory polyps of colon without complications: Secondary | ICD-10-CM | POA: Diagnosis not present

## 2016-01-17 LAB — HM COLONOSCOPY

## 2016-01-18 DIAGNOSIS — M25512 Pain in left shoulder: Secondary | ICD-10-CM | POA: Diagnosis not present

## 2016-01-23 DIAGNOSIS — M25512 Pain in left shoulder: Secondary | ICD-10-CM | POA: Diagnosis not present

## 2016-02-01 DIAGNOSIS — M25512 Pain in left shoulder: Secondary | ICD-10-CM | POA: Diagnosis not present

## 2016-02-08 DIAGNOSIS — M25512 Pain in left shoulder: Secondary | ICD-10-CM | POA: Diagnosis not present

## 2016-02-25 DIAGNOSIS — K208 Other esophagitis: Secondary | ICD-10-CM | POA: Diagnosis not present

## 2016-02-25 DIAGNOSIS — K449 Diaphragmatic hernia without obstruction or gangrene: Secondary | ICD-10-CM | POA: Diagnosis not present

## 2016-03-26 DIAGNOSIS — D225 Melanocytic nevi of trunk: Secondary | ICD-10-CM | POA: Diagnosis not present

## 2016-03-26 DIAGNOSIS — D1801 Hemangioma of skin and subcutaneous tissue: Secondary | ICD-10-CM | POA: Diagnosis not present

## 2016-03-26 DIAGNOSIS — L821 Other seborrheic keratosis: Secondary | ICD-10-CM | POA: Diagnosis not present

## 2016-04-07 DIAGNOSIS — S46012D Strain of muscle(s) and tendon(s) of the rotator cuff of left shoulder, subsequent encounter: Secondary | ICD-10-CM | POA: Diagnosis not present

## 2016-04-08 DIAGNOSIS — Z8546 Personal history of malignant neoplasm of prostate: Secondary | ICD-10-CM | POA: Diagnosis not present

## 2016-04-08 DIAGNOSIS — N5201 Erectile dysfunction due to arterial insufficiency: Secondary | ICD-10-CM | POA: Diagnosis not present

## 2016-04-15 DIAGNOSIS — S46012D Strain of muscle(s) and tendon(s) of the rotator cuff of left shoulder, subsequent encounter: Secondary | ICD-10-CM | POA: Diagnosis not present

## 2016-04-30 DIAGNOSIS — M19012 Primary osteoarthritis, left shoulder: Secondary | ICD-10-CM | POA: Diagnosis not present

## 2016-04-30 DIAGNOSIS — S43432D Superior glenoid labrum lesion of left shoulder, subsequent encounter: Secondary | ICD-10-CM | POA: Diagnosis not present

## 2016-04-30 DIAGNOSIS — M7542 Impingement syndrome of left shoulder: Secondary | ICD-10-CM | POA: Diagnosis not present

## 2016-05-16 DIAGNOSIS — H25813 Combined forms of age-related cataract, bilateral: Secondary | ICD-10-CM | POA: Diagnosis not present

## 2016-05-16 DIAGNOSIS — H01001 Unspecified blepharitis right upper eyelid: Secondary | ICD-10-CM | POA: Diagnosis not present

## 2016-05-16 DIAGNOSIS — T1502XA Foreign body in cornea, left eye, initial encounter: Secondary | ICD-10-CM | POA: Diagnosis not present

## 2016-05-16 DIAGNOSIS — H524 Presbyopia: Secondary | ICD-10-CM | POA: Diagnosis not present

## 2016-06-11 DIAGNOSIS — M75112 Incomplete rotator cuff tear or rupture of left shoulder, not specified as traumatic: Secondary | ICD-10-CM | POA: Diagnosis not present

## 2016-06-11 DIAGNOSIS — M19012 Primary osteoarthritis, left shoulder: Secondary | ICD-10-CM | POA: Diagnosis not present

## 2016-06-11 DIAGNOSIS — S43432D Superior glenoid labrum lesion of left shoulder, subsequent encounter: Secondary | ICD-10-CM | POA: Diagnosis not present

## 2016-08-21 DIAGNOSIS — Z23 Encounter for immunization: Secondary | ICD-10-CM | POA: Diagnosis not present

## 2017-01-01 ENCOUNTER — Telehealth: Payer: Self-pay | Admitting: *Deleted

## 2017-01-01 NOTE — Progress Notes (Signed)
Pre visit review using our clinic review tool, if applicable. No additional management support is needed unless otherwise documented below in the visit note. 

## 2017-01-01 NOTE — Telephone Encounter (Signed)
Scheduled AWV.

## 2017-01-01 NOTE — Progress Notes (Signed)
Subjective:   Harold Leach is a 76 y.o. male who presents for Medicare Annual/Subsequent preventive examination.  Review of Systems:  No ROS.  Medicare Wellness Visit.  Cardiac Risk Factors include: advanced age (>23men, >94 women);male gender   Sleep patterns: 7- hrs/night. Wakes up feeling rested.  Home Safety/Smoke Alarms: Feels safe in home. Smoke alarms in place.   Living environment; residence and Firearm Safety: Lives with wife and dog. Seat Belt Safety/Bike Helmet: Wears seat belt.   Counseling:   Eye Exam- Yearly. Wears bifocals. Dental- Goes every 6 months.  Male:   CCS- Last year, normal per pt. Dr Benson Norway.   PSA- No results found for: PSA. Prostate has been removed.     Objective:    Vitals: BP 126/80   Pulse (!) 57   Temp 98.4 F (36.9 C) (Oral)   Ht 5\' 10"  (1.778 m)   Wt 170 lb (77.1 kg)   SpO2 94%   BMI 24.39 kg/m   Body mass index is 24.39 kg/m.  Tobacco History  Smoking Status  . Former Smoker  . Types: Pipe  . Quit date: 03/20/1981  Smokeless Tobacco  . Never Used    Comment: Quit in 1983     Counseling given: Not Answered   Past Medical History:  Diagnosis Date  . 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 Arthralgia of left shoulder region 01/02/2017  . Chronic insomnia 01/02/2017  . 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 Gastroesophageal reflux disease without esophagitis 01/02/2017  . Hyperlipidemia 11/28/2015  . Macular degeneration 01/02/2017  . Prostate cancer [facility])    Past Surgical History:  Procedure Laterality Date  . HERNIA REPAIR    . PROSTATE SURGERY  2012   Family History  Problem Relation Age of Onset  . Heart disease Mother   . Hypertension Mother   . Prostate cancer Father   . Heart disease Maternal Grandmother   . Stroke Maternal Grandmother    History  Sexual Activity  . Sexual activity: Yes  . Partners: Female    Outpatient Encounter Prescriptions as of 01/02/2017  Medication Sig  . aspirin 81  MG tablet Take 81 mg by mouth daily.  . Biotin 1000 MCG tablet Take 5,000 mcg by mouth daily.  . ferrous sulfate 325 (65 FE) MG tablet Take 325 mg by mouth daily with breakfast.  . FLORA-Q (FLORA-Q) CAPS capsule Take 1 capsule by mouth daily.  . magnesium gluconate (MAGONATE) 30 MG tablet Take 400 mg by mouth daily.  . Multiple Vitamins-Minerals (ICAPS AREDS FORMULA PO) Take 2 capsules by mouth daily.   . pantoprazole (PROTONIX) 40 MG tablet Take 1 tablet (40 mg total) by mouth daily.  . Sulfacetamide Sodium-Sulfur (PLEXION) 9.8-4.8 % CREA Apply 1 Tube topically as needed.  . traZODone (DESYREL) 150 MG tablet Take 1 tablet (150 mg total) by mouth at bedtime as needed for sleep.  . vitamin C (ASCORBIC ACID) 500 MG tablet Take 1,000 mg by mouth daily.  . [DISCONTINUED] Omega-3 Fatty Acids (FISH OIL) 645 MG CAPS Take 1,000 mg by mouth daily.  . [DISCONTINUED] Sulfacetamide Sodium-Sulfur (PLEXION) 9.8-4.8 % CREA Apply 1 Tube topically as needed.  . [DISCONTINUED] traZODone (DESYREL) 150 MG tablet Take 150 mg by mouth at bedtime.   . [DISCONTINUED] traZODone (DESYREL) 150 MG tablet Take 1 tablet (150 mg total) by mouth at bedtime as needed for sleep. Take 1 to 1-1/2 tablets by mouth at bedtime  . [DISCONTINUED] HYDROcodone-acetaminophen (NORCO) 10-325 MG  tablet Take 1-2 tablets by mouth every 4 (four) hours as needed (pain).  . [DISCONTINUED] ondansetron (ZOFRAN) 4 MG tablet Take 1 tablet (4 mg total) by mouth every 6 (six) hours as needed for nausea.   No facility-administered encounter medications on file as of 01/02/2017.     Activities of Daily Living In your present state of health, do you have any difficulty performing the following activities: 01/02/2017  Hearing? N  Vision? N  Difficulty concentrating or making decisions? Y  Walking or climbing stairs? N  Dressing or bathing? N  Doing errands, shopping? N  Preparing Food and eating ? N  Using the Toilet? N  In the past six months, have  you accidently leaked urine? Y  Do you have problems with loss of bowel control? N  Managing your Medications? N  Managing your Finances? N  Housekeeping or managing your Housekeeping? N  Some recent data might be hidden    Patient Care Team: Briscoe Deutscher, DO as PCP - General (Family Medicine)   Assessment:    Physical assessment deferred to PCP.  Exercise Activities and Dietary recommendations Current Exercise Habits: Home exercise routine, Type of exercise: stretching;treadmill, Time (Minutes): 50, Frequency (Times/Week): 7, Weekly Exercise (Minutes/Week): 350, Intensity: Mild  Diet (meal preparation, eat out, water intake, caffeinated beverages, dairy products, fruits and vegetables): No coffee. 2 - 12oz jugs of water.  Breakfast: Toast or biscuit Lunch: Sandwich Dinner:  Meat, vegetable and carb.  Increase water intake.  Goals    . Increase water intake     . Maintain current health status      Fall Risk Fall Risk  01/02/2017 01/02/2017  Falls in the past year? No Yes   Depression Screen PHQ 2/9 Scores 01/02/2017 01/02/2017  PHQ - 2 Score 0 0    Cognitive Function MMSE - Mini Mental State Exam 01/02/2017  Orientation to time 5  Orientation to Place 5  Registration 3  Attention/ Calculation 5  Recall 3  Language- name 2 objects 2  Language- repeat 1  Language- follow 3 step command 3  Language- read & follow direction 1  Write a sentence 1  Copy design 1  Total score 30        Immunization History  Administered Date(s) Administered  . Influenza, High Dose Seasonal PF 08/21/2016  . Influenza-Unspecified 08/21/2016  . Pneumococcal Conjugate-13 10/10/2015  . Tdap 09/12/2016   Screening Tests Health Maintenance  Topic Date Due  . INFLUENZA VACCINE  04/22/2017  . COLONOSCOPY  09/30/2025  . TETANUS/TDAP  09/12/2026  . PNA vac Low Risk Adult  Addressed      Plan:    During the course of the visit the patient was educated and counseled about the  following appropriate screening and preventive services:   Vaccines to include Pneumoccal, Influenza, Hepatitis B, Td, Zostavax, HCV  Cardiovascular Disease  Colorectal cancer screening  Diabetes screening  Prostate Cancer Screening  Glaucoma screening  Nutrition counseling   Smoking cessation counseling  Patient Instructions (the written plan) was given to the patient.    Briscoe Deutscher, DO  01/04/2017  I have personally reviewed the Medicare Annual Wellness questionnaire and have noted 1. The patient's medical and social history 2. Their use of alcohol, tobacco or illicit drugs 3. Their current medications and supplements 4. The patient's functional ability including ADL's, fall risks, home safety risks and hearing or visual impairment. 5. Diet and physical activities 6. Evidence for depression or mood disorders 7. Reviewed Updated  provider list, see scanned forms and [facility] Snapshot.   The patients weight, height, BMI and visual acuity have been recorded in the chart I have made referrals, counseling and provided education to the patient based review of the above and I have provided the pt with a written personalized care plan for preventive services.  I have provided the patient with a copy of your personalized plan for preventive services. Instructed to take the time to review along with their updated medication list.  Briscoe Deutscher, D.O. [place], [facility]

## 2017-01-02 ENCOUNTER — Ambulatory Visit (INDEPENDENT_AMBULATORY_CARE_PROVIDER_SITE_OTHER): Payer: Medicare Other | Admitting: Family Medicine

## 2017-01-02 ENCOUNTER — Encounter: Payer: Self-pay | Admitting: Family Medicine

## 2017-01-02 VITALS — BP 126/80 | HR 57 | Temp 98.4°F | Ht 70.0 in | Wt 170.0 lb

## 2017-01-02 DIAGNOSIS — M545 Low back pain, unspecified: Secondary | ICD-10-CM

## 2017-01-02 DIAGNOSIS — K219 Gastro-esophageal reflux disease without esophagitis: Secondary | ICD-10-CM | POA: Insufficient documentation

## 2017-01-02 DIAGNOSIS — L719 Rosacea, unspecified: Secondary | ICD-10-CM | POA: Diagnosis not present

## 2017-01-02 DIAGNOSIS — H3530 Unspecified macular degeneration: Secondary | ICD-10-CM

## 2017-01-02 DIAGNOSIS — G8929 Other chronic pain: Secondary | ICD-10-CM

## 2017-01-02 DIAGNOSIS — G4700 Insomnia, unspecified: Secondary | ICD-10-CM

## 2017-01-02 DIAGNOSIS — Z9889 Other specified postprocedural states: Secondary | ICD-10-CM

## 2017-01-02 DIAGNOSIS — E785 Hyperlipidemia, unspecified: Secondary | ICD-10-CM

## 2017-01-02 DIAGNOSIS — Z0000 Encounter for general adult medical examination without abnormal findings: Secondary | ICD-10-CM | POA: Diagnosis not present

## 2017-01-02 DIAGNOSIS — F5104 Psychophysiologic insomnia: Secondary | ICD-10-CM

## 2017-01-02 DIAGNOSIS — Z8719 Personal history of other diseases of the digestive system: Secondary | ICD-10-CM | POA: Diagnosis not present

## 2017-01-02 DIAGNOSIS — M25512 Pain in left shoulder: Secondary | ICD-10-CM | POA: Insufficient documentation

## 2017-01-02 HISTORY — DX: Pain in left shoulder: M25.512

## 2017-01-02 HISTORY — DX: Other chronic pain: G89.29

## 2017-01-02 HISTORY — DX: Psychophysiologic insomnia: F51.04

## 2017-01-02 HISTORY — DX: Low back pain, unspecified: M54.50

## 2017-01-02 HISTORY — DX: Unspecified macular degeneration: H35.30

## 2017-01-02 HISTORY — DX: Rosacea, unspecified: L71.9

## 2017-01-02 HISTORY — DX: Gastro-esophageal reflux disease without esophagitis: K21.9

## 2017-01-02 LAB — COMPREHENSIVE METABOLIC PANEL
ALT: 11 U/L (ref 0–53)
AST: 17 U/L (ref 0–37)
Albumin: 4.3 g/dL (ref 3.5–5.2)
Alkaline Phosphatase: 48 U/L (ref 39–117)
BUN: 28 mg/dL — ABNORMAL HIGH (ref 6–23)
CO2: 34 mEq/L — ABNORMAL HIGH (ref 19–32)
Calcium: 9.8 mg/dL (ref 8.4–10.5)
Chloride: 101 mEq/L (ref 96–112)
Creatinine, Ser: 1.29 mg/dL (ref 0.40–1.50)
GFR: 57.96 mL/min — ABNORMAL LOW (ref >60.00)
Glucose, Bld: 86 mg/dL (ref 70–99)
Potassium: 4.7 mEq/L (ref 3.5–5.1)
Sodium: 139 mEq/L (ref 135–145)
Total Bilirubin: 0.4 mg/dL (ref 0.2–1.2)
Total Protein: 7.2 g/dL (ref 6.0–8.3)

## 2017-01-02 LAB — LIPID PANEL
Cholesterol: 212 mg/dL — ABNORMAL HIGH (ref 0–200)
HDL: 46.9 mg/dL (ref >39.00)
LDL Cholesterol: 128 mg/dL — ABNORMAL HIGH (ref 0–99)
NonHDL: 165.05
Total CHOL/HDL Ratio: 5
Triglycerides: 187 mg/dL — ABNORMAL HIGH (ref 0.0–149.0)
VLDL: 37.4 mg/dL (ref 0.0–40.0)

## 2017-01-02 MED ORDER — SULFACETAMIDE SODIUM-SULFUR 9.8-4.8 % EX CREA: 1.0000 | TOPICAL_CREAM | CUTANEOUS | 1 refills | Status: DC | PRN

## 2017-01-02 MED ORDER — TRAZODONE HCL 150 MG PO TABS: 150.0000 mg | ORAL_TABLET | Freq: Every evening | ORAL | 2 refills | Status: DC | PRN

## 2017-01-02 NOTE — Progress Notes (Signed)
Harold Leach is a 76 y.o. male is here to [facility].   History of Present Illness:   Water quality scientist, CMA, acting as scribe for Dr. Juleen China.  HPI:  1. Insomnia, unspecified type. Stable on Trazodone for years. Due for Rx. Wants 30 days supply.    2. Acne rosacea. Gets cystic acne on nose. Sample given by Dermatology helps very well. Request Rx.     3. Hyperlipidemia, unspecified hyperlipidemia type. No on statin. Due for FLP.    4. Chronic midline low back pain without sciatica. Stable. Patient is active. Does daily exercises and stretches to help pain. Takes NSAID if very painful - a couple of times per month.    5. Arthralgia of left shoulder region. As above.    6. Gastroesophageal reflux disease without esophagitis. Stable, but only on Protonix.    7. Macular degeneration. Followed by EYE.    8. History of right inguinal hernia repair. Sometimes still feels pain in right lower groin. No lifting. No bulge. No swelling.    9. Encounter for Medicare annual wellness exam. Will see Harold Leach after this visit.     There are no preventive care reminders to display for this patient.  PMHx, SurgHx, SocialHx, Medications, and Allergies were reviewed in the Visit Navigator and updated as appropriate.   Past Medical History:  Diagnosis Date  . 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 Arthralgia of left shoulder region 01/02/2017  . Chronic insomnia 01/02/2017  . 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 Gastroesophageal reflux disease without esophagitis 01/02/2017  . Hyperlipidemia 11/28/2015  . Macular degeneration 01/02/2017  . Prostate cancer [facility])    Past Surgical History:  Procedure Laterality Date  . HERNIA REPAIR    . PROSTATE SURGERY  2012   Family History  Problem Relation Age of Onset  . Heart disease Mother   . Hypertension Mother   . Prostate cancer Father   . Heart disease Maternal Grandmother   . Stroke Maternal Grandmother    Social History    Substance Use Topics  . Smoking status: Former Smoker    Types: Pipe    Quit date: 03/20/1981  . Smokeless tobacco: Never Used     Comment: Quit in 1983  . Alcohol use No   Current Medications and Allergies:   .  aspirin 81 MG tablet, Take 81 mg by mouth daily., Disp: , Rfl:  .  Biotin 1000 MCG tablet, Take 5,000 mcg by mouth daily., Disp: , Rfl:  .  ferrous sulfate 325 (65 FE) MG tablet, Take 325 mg by mouth daily with breakfast., Disp: , Rfl:  .  FLORA-Q (FLORA-Q) CAPS capsule, Take 1 capsule by mouth daily., Disp: , Rfl:  .  magnesium gluconate (MAGONATE) 30 MG tablet, Take 400 mg by mouth daily., Disp: , Rfl:  .  Multiple Vitamins-Minerals (ICAPS AREDS FORMULA PO), Take 2 capsules by mouth daily. , Disp: , Rfl:  .  pantoprazole (PROTONIX) 40 MG tablet, Take 1 tablet (40 mg total) by mouth daily., Disp: 30 tablet, Rfl: 0 .  Sulfacetamide Sodium-Sulfur (PLEXION) 9.8-4.8 % CREA, Apply 1 Tube topically as needed., Disp: 57 g, Rfl: 1 .  traZODone (DESYREL) 150 MG tablet, Take 1 tablet (150 mg total) by mouth at bedtime as needed for sleep., Disp: 90 tablet, Rfl: 2 .  vitamin C (ASCORBIC ACID) 500 MG tablet, Take 1,000 mg by mouth daily., Disp: , Rfl:   No Known Allergies   Review of Systems:  Review of Systems  Constitutional: Negative for chills and fever.  HENT: Negative for hearing loss.   Eyes: Negative for blurred vision.  Respiratory: Negative for cough and wheezing.   Cardiovascular: Negative for chest pain and palpitations.  Gastrointestinal: Negative for abdominal pain, constipation, diarrhea, nausea and vomiting.  Genitourinary: Negative for dysuria.  Skin: Negative for rash.  Neurological: Negative for focal weakness.  Psychiatric/Behavioral: Negative for depression, memory loss, substance abuse and suicidal ideas.   Vitals:   Vitals:   01/02/17 0837  BP: 126/80  Pulse: (!) 57  Temp: 98.4 F (36.9 C)  TempSrc: Oral  SpO2: 94%  Weight: 170 lb (77.1 kg)   Height: 5\' 10"  (1.778 m)     Body mass index is 24.39 kg/m.  Physical Exam:   Physical Exam  Constitutional: He is oriented to person, place, and time. He appears well-developed and well-nourished. No distress.  HENT:  Head: Normocephalic and atraumatic.  Right Ear: External ear normal.  Left Ear: External ear normal.  Nose: Nose normal.  Mouth/Throat: Oropharynx is clear and moist.  Eyes: Conjunctivae and EOM are normal. Pupils are equal, round, and reactive to light.  Neck: Normal range of motion. Neck supple.  Cardiovascular: Normal rate, regular rhythm, normal heart sounds and intact distal pulses.   Pulmonary/Chest: Effort normal and breath sounds normal.  Abdominal: Soft. Bowel sounds are normal.  Musculoskeletal: Normal range of motion.  Neurological: He is alert and oriented to person, place, and time.  Skin: Skin is warm and dry.  Psychiatric: He has a normal mood and affect. His behavior is normal. Judgment and thought content normal.  Nursing note and vitals reviewed.  Assessment and Plan:    Harold Leach was seen today for establish care and medicare wellness.  Diagnoses and all orders for this visit:  Insomnia, unspecified type Comments: Refill needed today. 90 day Rx requested. Orders: -     traZODone (DESYREL) 150 MG tablet; Take 1 tablet (150 mg total) by mouth at bedtime as needed for sleep.  Acne rosacea Comments: Refill requested today. Rx from Dermatologist originally. Orders: -     Sulfacetamide Sodium-Sulfur (PLEXION) 9.8-4.8 % CREA; Apply 1 Tube topically as needed.  Hyperlipidemia, unspecified hyperlipidemia type Comments: Labs due.  Orders: -     Comprehensive metabolic panel -     Lipid panel  Chronic midline low back pain without sciatica Comments: Stable.  Arthralgia of left shoulder region Comments: Stable.  Gastroesophageal reflux disease without esophagitis Comments: Stable.  Macular degeneration Comments: Followed by  Eye.  History of right inguinal hernia repair Comments: Mild pain every once in a while. No heavy lifting. No bulge. Red flags reviewed.  Encounter for Medicare annual wellness exam Comments: I reviewed Harold Leach's note and agree with plan.    . Reviewed expectations re: course of current medical issues. . Discussed self-management of symptoms. . Outlined signs and symptoms indicating need for more acute intervention. . Patient verbalized understanding and all questions were answered. . See orders for this visit as documented in the electronic medical record. . Patient received an After Visit Summary.  Records requested if needed. I spent 30 minutes with this patient, greater than 50% was face-to-face time counseling regarding the above diagnoses.  CMA served as Education administrator during this visit. History, Physical, and Plan performed by medical provider. Documentation and orders reviewed and attested to. Briscoe Deutscher, D.O.  Briscoe Deutscher, [place], [place] 01/04/2017   Follow-up: Return in about 6 months (around 07/04/2017).  Meds ordered this encounter  Medications  . DISCONTD: Sulfacetamide Sodium-Sulfur (PLEXION) 9.8-4.8 % CREA    Sig: Apply 1 Tube topically as needed.  Marland Kitchen DISCONTD: traZODone (DESYREL) 150 MG tablet    Sig: Take 1 tablet (150 mg total) by mouth at bedtime as needed for sleep. Take 1 to 1-1/2 tablets by mouth at bedtime    Dispense:  90 tablet    Refill:  2  . Sulfacetamide Sodium-Sulfur (PLEXION) 9.8-4.8 % CREA    Sig: Apply 1 Tube topically as needed.    Dispense:  57 g    Refill:  1  . traZODone (DESYREL) 150 MG tablet    Sig: Take 1 tablet (150 mg total) by mouth at bedtime as needed for sleep.    Dispense:  90 tablet    Refill:  2   Medications Discontinued During This Encounter  Medication Reason  . HYDROcodone-acetaminophen (NORCO) 10-325 MG tablet Error  . Omega-3 Fatty Acids (FISH OIL) 645 MG CAPS Error  . ondansetron (ZOFRAN) 4 MG tablet  Error  . traZODone (DESYREL) 150 MG tablet Reorder  . Sulfacetamide Sodium-Sulfur (PLEXION) 9.8-4.8 % CREA Reorder  . traZODone (DESYREL) 150 MG tablet Reorder   Orders Placed This Encounter  Procedures  . Comprehensive metabolic panel  . Lipid panel

## 2017-01-04 ENCOUNTER — Encounter: Payer: Self-pay | Admitting: Family Medicine

## 2017-03-11 DIAGNOSIS — K208 Other esophagitis: Secondary | ICD-10-CM | POA: Diagnosis not present

## 2017-03-11 DIAGNOSIS — K449 Diaphragmatic hernia without obstruction or gangrene: Secondary | ICD-10-CM | POA: Diagnosis not present

## 2017-04-06 DIAGNOSIS — N5201 Erectile dysfunction due to arterial insufficiency: Secondary | ICD-10-CM | POA: Diagnosis not present

## 2017-04-06 DIAGNOSIS — C61 Malignant neoplasm of prostate: Secondary | ICD-10-CM | POA: Diagnosis not present

## 2017-04-06 DIAGNOSIS — N393 Stress incontinence (female) (male): Secondary | ICD-10-CM | POA: Diagnosis not present

## 2017-04-07 ENCOUNTER — Encounter: Payer: Self-pay | Admitting: Physician Assistant

## 2017-04-07 ENCOUNTER — Ambulatory Visit (INDEPENDENT_AMBULATORY_CARE_PROVIDER_SITE_OTHER): Payer: Medicare Other

## 2017-04-07 ENCOUNTER — Ambulatory Visit (INDEPENDENT_AMBULATORY_CARE_PROVIDER_SITE_OTHER): Payer: Medicare Other | Admitting: Physician Assistant

## 2017-04-07 VITALS — BP 110/70 | HR 51 | Temp 97.7°F | Ht 70.0 in | Wt 162.4 lb

## 2017-04-07 DIAGNOSIS — S99911A Unspecified injury of right ankle, initial encounter: Secondary | ICD-10-CM | POA: Diagnosis not present

## 2017-04-07 DIAGNOSIS — M25571 Pain in right ankle and joints of right foot: Secondary | ICD-10-CM

## 2017-04-07 NOTE — Progress Notes (Signed)
Harold Leach is a 76 y.o. male here for injured right ankle fell off ladder.  I acted as a Education administrator for [employer], PA-C Anselmo Pickler, LPN  History of Present Illness:   Chief Complaint  Patient presents with  . injury right ankle    fell off ladder    Injury  The incident occurred 3 to 6 hours ago. The incident occurred at home. The injury mechanism was a fall. Context: Ladder was on the side of buiding and then patient slid down ladder onto his R foot. No protective equipment was used. There is an injury to the right ankle. The pain is moderate (when pt stands on feet, right ankle pain goes to a 6/10). It is unlikely that a foreign body is present. Associated symptoms include inability to bear weight and weakness. Pertinent negatives include no loss of consciousness, nausea, numbness or tingling. There have been no prior injuries to these areas. His tetanus status is UTD.    Past Medical History:  Diagnosis Date  . 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 Arthralgia of left shoulder region 01/02/2017  . Chronic insomnia 01/02/2017  . 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 Gastroesophageal reflux disease without esophagitis 01/02/2017  . Hyperlipidemia 11/28/2015  . Macular degeneration 01/02/2017  . Prostate cancer [facility])      Social History   Social History  . Marital status: Married    Spouse name: N/A  . Number of children: N/A  . Years of education: N/A   Occupational History  . Retired    Social History Main Topics  . Smoking status: Former Smoker    Types: Pipe    Quit date: 03/20/1981  . Smokeless tobacco: Never Used     Comment: Quit in 1983  . Alcohol use No  . Drug use: No  . Sexual activity: Yes    Partners: Female   Other Topics Concern  . Not on file   Social History Narrative  . No narrative on file    Past Surgical History:  Procedure Laterality Date  . HERNIA REPAIR    . PROSTATE SURGERY  2012    Family History  Problem  Relation Age of Onset  . Heart disease Mother   . Hypertension Mother   . Prostate cancer Father   . Heart disease Maternal Grandmother   . Stroke Maternal Grandmother     No Known Allergies  Current Medications:   Current Outpatient Prescriptions:  .  KRILL OIL PO, Take 1 capsule by mouth daily., Disp: , Rfl:  .  Red Yeast Rice 600 MG CAPS, Take 1,200 mg by mouth daily., Disp: , Rfl:  .  aspirin 81 MG tablet, Take 81 mg by mouth daily., Disp: , Rfl:  .  Biotin 1000 MCG tablet, Take 5,000 mcg by mouth daily., Disp: , Rfl:  .  ferrous sulfate 325 (65 FE) MG tablet, Take 325 mg by mouth daily with breakfast., Disp: , Rfl:  .  FLORA-Q (FLORA-Q) CAPS capsule, Take 1 capsule by mouth daily., Disp: , Rfl:  .  magnesium gluconate (MAGONATE) 30 MG tablet, Take 400 mg by mouth daily., Disp: , Rfl:  .  Multiple Vitamins-Minerals (ICAPS AREDS FORMULA PO), Take 2 capsules by mouth daily. , Disp: , Rfl:  .  pantoprazole (PROTONIX) 40 MG tablet, Take 1 tablet (40 mg total) by mouth daily., Disp: 30 tablet, Rfl: 0 .  Sulfacetamide Sodium-Sulfur (PLEXION) 9.8-4.8 % CREA, Apply 1 Tube topically as needed., Disp:  57 g, Rfl: 1 .  traZODone (DESYREL) 150 MG tablet, Take 1 tablet (150 mg total) by mouth at bedtime as needed for sleep., Disp: 90 tablet, Rfl: 2 .  vitamin C (ASCORBIC ACID) 500 MG tablet, Take 1,000 mg by mouth daily., Disp: , Rfl:    Review of Systems:   Review of Systems  Constitutional: Negative for chills, fever, malaise/fatigue and weight loss.  Gastrointestinal: Negative for nausea.  Musculoskeletal: Positive for joint pain (R ankle).  Neurological: Positive for weakness. Negative for tingling, loss of consciousness and numbness.  Endo/Heme/Allergies: Does not bruise/bleed easily.    Vitals:   Vitals:   04/07/17 1512  BP: 110/70  Pulse: (!) 51  Temp: 97.7 F (36.5 C)  TempSrc: Oral  SpO2: 96%  Weight: 162 lb 6.1 oz (73.7 kg)  Height: 5\' 10"  (1.778 m)     Body mass  index is 23.3 kg/m.  Physical Exam:   Physical Exam  Constitutional: He appears well-developed. He is cooperative.  Non-toxic appearance. He does not have a sickly appearance. He does not appear ill. No distress.  Cardiovascular: Normal rate, regular rhythm, S1 normal, S2 normal, normal heart sounds and normal pulses.   No LE edema  Pulmonary/Chest: Effort normal and breath sounds normal.  Musculoskeletal:       Right foot: There is normal capillary refill.  Tenderness to dorsum of R midfoot and medial malleolus, no visible or palpable swelling to foot. No decreased ROM. No erythema. No evidence of infection.   Neurological: He is alert. GCS eye subscore is 4. GCS verbal subscore is 5. GCS motor subscore is 6.  Sensation intact to R foot.  Skin: Skin is warm, dry and intact.  Psychiatric: He has a normal mood and affect. His speech is normal and behavior is normal.  Nursing note and vitals reviewed.   R ankle xray -- reviewed with Dr. Teresa Coombs, preliminary review shows no acute fractures.  Assessment and Plan:    Jadan was seen today for injury right ankle.  Diagnoses and all orders for this visit:  Acute right ankle pain -     DG Ankle Complete Right; Future   R ankle xray reviewed with Dr. Teresa Coombs without acute fractures. Continue to ice. Patient placed in lace-up ankle brace, provided in clinic. May take Tylenol 500 mg q 6-8 hours as needed for pain. Follow-up with Dr. Paulla Fore if symptoms persist/worsen.  . Reviewed expectations re: course of current medical issues. . Discussed self-management of symptoms. . Outlined signs and symptoms indicating need for more acute intervention. . Patient verbalized understanding and all questions were answered. . See orders for this visit as documented in the electronic medical record. . Patient received an After-Visit Summary.  CMA or LPN served as scribe during this visit. History, Physical, and Plan performed by medical  provider. Documentation and orders reviewed and attested to.  Inda Coke, PA-C

## 2017-04-07 NOTE — Patient Instructions (Addendum)
It was great to meet you!  Take 500 mg Tylenol as needed for pain, may take every 6 hours.  Ice ankle as needed.  If pain is not improved or worsened, please make an appointment to see Dr. Teresa Coombs here at [facility] for further evaluation and treatment.

## 2017-04-23 DIAGNOSIS — M25571 Pain in right ankle and joints of right foot: Secondary | ICD-10-CM | POA: Diagnosis not present

## 2017-05-18 DIAGNOSIS — H01004 Unspecified blepharitis left upper eyelid: Secondary | ICD-10-CM | POA: Diagnosis not present

## 2017-05-18 DIAGNOSIS — H25813 Combined forms of age-related cataract, bilateral: Secondary | ICD-10-CM | POA: Diagnosis not present

## 2017-05-18 DIAGNOSIS — H524 Presbyopia: Secondary | ICD-10-CM | POA: Diagnosis not present

## 2017-05-18 DIAGNOSIS — H01001 Unspecified blepharitis right upper eyelid: Secondary | ICD-10-CM | POA: Diagnosis not present

## 2017-06-15 ENCOUNTER — Emergency Department (HOSPITAL_BASED_OUTPATIENT_CLINIC_OR_DEPARTMENT_OTHER)
Admission: EM | Admit: 2017-06-15 | Discharge: 2017-06-15 | Disposition: A | Discharge status: Home / Self Care | Payer: Medicare Other | Attending: Emergency Medicine | Admitting: Emergency Medicine

## 2017-06-15 ENCOUNTER — Encounter (HOSPITAL_BASED_OUTPATIENT_CLINIC_OR_DEPARTMENT_OTHER): Payer: Self-pay | Admitting: *Deleted

## 2017-06-15 DIAGNOSIS — S0006XA Insect bite (nonvenomous) of scalp, initial encounter: Secondary | ICD-10-CM | POA: Insufficient documentation

## 2017-06-15 DIAGNOSIS — S50861A Insect bite (nonvenomous) of right forearm, initial encounter: Secondary | ICD-10-CM | POA: Insufficient documentation

## 2017-06-15 DIAGNOSIS — Y999 Unspecified external cause status: Secondary | ICD-10-CM | POA: Diagnosis not present

## 2017-06-15 DIAGNOSIS — Z7982 Long term (current) use of aspirin: Secondary | ICD-10-CM | POA: Diagnosis not present

## 2017-06-15 DIAGNOSIS — D075 Carcinoma in situ of prostate: Secondary | ICD-10-CM | POA: Insufficient documentation

## 2017-06-15 DIAGNOSIS — S50862A Insect bite (nonvenomous) of left forearm, initial encounter: Secondary | ICD-10-CM | POA: Diagnosis not present

## 2017-06-15 DIAGNOSIS — Z87891 Personal history of nicotine dependence: Secondary | ICD-10-CM | POA: Insufficient documentation

## 2017-06-15 DIAGNOSIS — T63441A Toxic effect of venom of bees, accidental (unintentional), initial encounter: Secondary | ICD-10-CM | POA: Diagnosis not present

## 2017-06-15 DIAGNOSIS — W57XXXA Bitten or stung by nonvenomous insect and other nonvenomous arthropods, initial encounter: Secondary | ICD-10-CM | POA: Insufficient documentation

## 2017-06-15 DIAGNOSIS — Y92017 Garden or yard in single-family (private) house as the place of occurrence of the external cause: Secondary | ICD-10-CM | POA: Diagnosis not present

## 2017-06-15 DIAGNOSIS — Y9389 Activity, other specified: Secondary | ICD-10-CM | POA: Insufficient documentation

## 2017-06-15 DIAGNOSIS — Z79899 Other long term (current) drug therapy: Secondary | ICD-10-CM | POA: Diagnosis not present

## 2017-06-15 MED ORDER — ACETAMINOPHEN-CODEINE 120-12 MG/5ML PO SOLN: 10.0000 mL | ORAL | 0 refills | Status: DC | PRN

## 2017-06-15 MED ORDER — DIPHENHYDRAMINE HCL 50 MG/ML IJ SOLN
25.0000 mg | Freq: Once | INTRAMUSCULAR | Status: AC
  Administered 2017-06-15: 25 mg via INTRAMUSCULAR
  Filled 2017-06-15: qty 1

## 2017-06-15 MED ORDER — PREDNISONE 50 MG PO TABS: 50.0000 mg | ORAL_TABLET | Freq: Every day | ORAL | 0 refills | Status: DC

## 2017-06-15 MED ORDER — FAMOTIDINE 20 MG PO TABS: 20.0000 mg | ORAL_TABLET | Freq: Two times a day (BID) | ORAL | 0 refills | Status: DC

## 2017-06-15 MED ORDER — FAMOTIDINE 20 MG PO TABS
20.0000 mg | ORAL_TABLET | Freq: Once | ORAL | Status: AC
  Administered 2017-06-15: 20 mg via ORAL
  Filled 2017-06-15: qty 1

## 2017-06-15 MED ORDER — DEXAMETHASONE SODIUM PHOSPHATE 10 MG/ML IJ SOLN
10.0000 mg | Freq: Once | INTRAMUSCULAR | Status: AC
  Administered 2017-06-15: 10 mg via INTRAMUSCULAR
  Filled 2017-06-15: qty 1

## 2017-06-15 NOTE — ED Notes (Signed)
ED Provider at bedside. 

## 2017-06-15 NOTE — Discharge Instructions (Signed)
Return here as needed.  Follow-up with your primary Dr. for recheck

## 2017-06-15 NOTE — ED Triage Notes (Signed)
Was mowing the yard this am and was stung by 6 yellow jackets. He has been taking Benadryl and Ibuprofen for the pain and swelling. Local reaction to his left forearm with redness, itching and swelling.

## 2017-06-19 NOTE — ED Provider Notes (Signed)
[facility] DEPT Provider Note   CSN: [id number] Arrival date & time: 06/15/17  1959     History   Chief Complaint Chief Complaint  Patient presents with  . Insect Bite    HPI Harold Leach is a 76 y.o. male.  HPI Patient presents to the emergency department with bee stings to the right forearm and posterior scalp.  Patient states that his main area was he was stung 6 times on the left forearm.  He did take some ibuprofen and Benadryl afterwards seemed to help relieve the symptoms somewhat.  He states that he started having worsening symptoms after the medications wore off, and that is what [facility].  THE PATIENT DENIES ANY SHORTNESS BREATH, NAUSEA, VOMITING, HEADACHE, BLURRED VISION, BACK PAIN, NECK PAIN, wheezing, shortness of breath, chest pain, throat swelling, or syncope Past Medical History:  Diagnosis Date  . 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 Arthralgia of left shoulder region 01/02/2017  . Chronic insomnia 01/02/2017  . 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 Gastroesophageal reflux disease without esophagitis 01/02/2017  . Hyperlipidemia 11/28/2015  . Macular degeneration 01/02/2017  . Prostate cancer [facility])     Patient Active Problem List   Diagnosis Date Noted  . Chronic insomnia 01/02/2017  . Macular degeneration 01/02/2017  . 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 Arthralgia of left shoulder region 01/02/2017  . Gastroesophageal reflux disease without esophagitis 01/02/2017  . History of right inguinal hernia repair 01/02/2017  . Hyperlipidemia 11/28/2015    Past Surgical History:  Procedure Laterality Date  . HERNIA REPAIR    . PROSTATE SURGERY  2012       Home Medications    Prior to Admission medications   Medication Sig Start Date End Date Taking? Authorizing Provider  acetaminophen-codeine 120-12 MG/5ML solution Take 10 mLs by mouth every 4 (four)  hours as needed for moderate pain. 06/15/17   Mattingly Fountaine, Harrell Gave, PA-C  aspirin 81 MG tablet Take 81 mg by mouth daily.    [provider]  Biotin 1000 MCG tablet Take 5,000 mcg by mouth daily.    [provider]  famotidine (PEPCID) 20 MG tablet Take 1 tablet (20 mg total) by mouth 2 (two) times daily. 06/15/17   Saffron Busey, Harrell Gave, PA-C  ferrous sulfate 325 (65 FE) MG tablet Take 325 mg by mouth daily with breakfast.    [provider]  FLORA-Q (FLORA-Q) CAPS capsule Take 1 capsule by mouth daily.    [provider]  KRILL OIL PO Take 1 capsule by mouth daily.    [provider]  magnesium gluconate (MAGONATE) 30 MG tablet Take 400 mg by mouth daily.    [provider]  Multiple Vitamins-Minerals (ICAPS AREDS FORMULA PO) Take 2 capsules by mouth daily.     [provider]  pantoprazole (PROTONIX) 40 MG tablet Take 1 tablet (40 mg total) by mouth daily. 11/15/15   Lisette Abu, PA-C  predniSONE (DELTASONE) 50 MG tablet Take 1 tablet (50 mg total) by mouth daily with breakfast. 06/15/17   Kelvis Berger, Harrell Gave, PA-C  Red Yeast Rice 600 MG CAPS Take 1,200 mg by mouth daily.    [provider]  Sulfacetamide Sodium-Sulfur (PLEXION) 9.8-4.8 % CREA Apply 1 Tube topically as needed. 01/02/17   Briscoe Deutscher, DO  traZODone (DESYREL) 150 MG tablet Take 1 tablet (150 mg total) by mouth at bedtime as needed for sleep. 01/02/17 04/02/17  Briscoe Deutscher, DO  vitamin C (ASCORBIC ACID) 500 MG tablet Take 1,000 mg by mouth daily.    [provider]    Family History Family History  Problem Relation Age of Onset  . Heart disease Mother   . Hypertension Mother   . Prostate cancer Father   . Heart disease Maternal Grandmother   . Stroke Maternal Grandmother     Social History Social History  Substance Use Topics  . Smoking status: Former Smoker    Types: Pipe    Quit date: 03/20/1981  . Smokeless tobacco: Never Used      Comment: Quit in 1983  . Alcohol use No     Allergies   Patient has no known allergies.   Review of Systems Review of Systems All other systems negative except as documented in the HPI. All pertinent positives and negatives as reviewed in the HPI.  Physical Exam Updated Vital Signs BP (!) 146/77 (BP Location: Right Arm)   Pulse (!) 47   Temp 98 F (36.7 C) (Oral)   Resp 16   Ht 5\' 10"  (1.778 m)   Wt 72.6 kg (160 lb)   SpO2 100%   BMI 22.96 kg/m   Physical Exam  Constitutional: He is oriented to person, place, and time. He appears well-developed and well-nourished. No distress.  HENT:  Head: Normocephalic and atraumatic.  Eyes: Pupils are equal, round, and reactive to light.  Cardiovascular: Normal rate, regular rhythm and normal heart sounds.  Exam reveals no gallop and no friction rub.   No murmur heard. Pulmonary/Chest: Effort normal and breath sounds normal. No respiratory distress. He has no wheezes.  Neurological: He is alert and oriented to person, place, and time.  Skin: Skin is warm and dry.     Psychiatric: He has a normal mood and affect.  Nursing note and vitals reviewed.    ED Treatments / Results  Labs (all labs ordered are listed, but only abnormal results are displayed) Labs Reviewed - No data to display  EKG  EKG Interpretation None       Radiology No results found.  Procedures Procedures (including critical care time)  Medications Ordered in ED Medications  dexamethasone (DECADRON) injection 10 mg (10 mg Intramuscular Given 06/15/17 2327)  famotidine (PEPCID) tablet 20 mg (20 mg Oral Given 06/15/17 2327)  diphenhydrAMINE (BENADRYL) injection 25 mg (25 mg Intramuscular Given 06/15/17 2327)     Initial Impression / Assessment and Plan / ED Course  I have reviewed the triage vital signs and the nursing notes.  Pertinent labs & imaging results that were available during my care of the patient were reviewed by me and considered in my  medical decision making (see chart for details).     Patient be treated for the reaction from the bee stings, along with pain control.  Patient is advised to return here as needed.  Patient agrees the plan and all questions were answered.  Follow-up with his primary care doctor, as well.  Patient has not had no anaphylactic type reaction  Final Clinical Impressions(s) / ED Diagnoses   Final diagnoses:  Bee sting, accidental or unintentional, initial encounter    New Prescriptions Discharge Medication List as of 06/15/2017 11:19 PM    START taking these medications   Details  acetaminophen-codeine 120-12 MG/5ML solution Take 10 mLs by mouth every 4 (four) hours as needed for moderate pain., Starting Mon 06/15/2017, Print    famotidine (PEPCID) 20 MG tablet Take 1 tablet (20  mg total) by mouth 2 (two) times daily., Starting Mon 06/15/2017, Print    predniSONE (DELTASONE) 50 MG tablet Take 1 tablet (50 mg total) by mouth daily with breakfast., Starting Mon 06/15/2017, Print         Salem Mastrogiovanni, [place], PA-C 06/19/17 0041    Fredia Sorrow, MD 06/27/17 [phone]

## 2017-06-29 DIAGNOSIS — Z23 Encounter for immunization: Secondary | ICD-10-CM | POA: Diagnosis not present

## 2017-07-06 ENCOUNTER — Ambulatory Visit (INDEPENDENT_AMBULATORY_CARE_PROVIDER_SITE_OTHER): Payer: Medicare Other | Admitting: Family Medicine

## 2017-07-06 ENCOUNTER — Encounter: Payer: Self-pay | Admitting: Family Medicine

## 2017-07-06 VITALS — BP 116/68 | HR 56 | Temp 97.8°F | Ht 70.0 in | Wt 167.0 lb

## 2017-07-06 DIAGNOSIS — E7800 Pure hypercholesterolemia, unspecified: Secondary | ICD-10-CM | POA: Diagnosis not present

## 2017-07-06 LAB — LIPID PANEL
Cholesterol: 184 mg/dL (ref 0–200)
HDL: 48.3 mg/dL (ref >39.00)
LDL Cholesterol: 119 mg/dL — ABNORMAL HIGH (ref 0–99)
NonHDL: 135.39
Total CHOL/HDL Ratio: 4
Triglycerides: 80 mg/dL (ref 0.0–149.0)
VLDL: 16 mg/dL (ref 0.0–40.0)

## 2017-07-06 LAB — COMPREHENSIVE METABOLIC PANEL
ALT: 11 U/L (ref 0–53)
AST: 16 U/L (ref 0–37)
Albumin: 4.2 g/dL (ref 3.5–5.2)
Alkaline Phosphatase: 60 U/L (ref 39–117)
BUN: 25 mg/dL — ABNORMAL HIGH (ref 6–23)
CO2: 29 mEq/L (ref 19–32)
Calcium: 9.4 mg/dL (ref 8.4–10.5)
Chloride: 103 mEq/L (ref 96–112)
Creatinine, Ser: 1.17 mg/dL (ref 0.40–1.50)
GFR: 64.78 mL/min (ref >60.00)
Glucose, Bld: 90 mg/dL (ref 70–99)
Potassium: 4.4 mEq/L (ref 3.5–5.1)
Sodium: 140 mEq/L (ref 135–145)
Total Bilirubin: 0.4 mg/dL (ref 0.2–1.2)
Total Protein: 6.6 g/dL (ref 6.0–8.3)

## 2017-07-06 NOTE — Progress Notes (Signed)
Harold Leach is a 76 y.o. male is here for follow up.  History of Present Illness:   HPI:   Hyperlipidemia  Is the patient taking medications without problems? Taking red yeast rice since last visit. Does the patient complain of muscle aches?   []   YES  [x]    NO Trying to exercise on a regular basis? [x]   YES  []   NO Diet Compliance: compliant most of the time. Concerns: none. Cardiovascular ROS: no chest pain or dyspnea on exertion.   Lipids:    Component Value Date/Time   CHOL 212 (H) 01/02/2017 0931   TRIG 187.0 (H) 01/02/2017 0931   HDL 46.90 01/02/2017 0931   VLDL 37.4 01/02/2017 0931   CHOLHDL 5 01/02/2017 0931   There are no preventive care reminders to display for this patient.   Depression screen [facility] 2/9 01/02/2017 01/02/2017  Decreased Interest 0 0  Down, Depressed, Hopeless 0 0  PHQ - 2 Score 0 0   PMHx, SurgHx, SocialHx, FamHx, Medications, and Allergies were reviewed in the Visit Navigator and updated as appropriate.   Patient Active Problem List   Diagnosis Date Noted  . Chronic insomnia 01/02/2017  . Macular degeneration 01/02/2017  . 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 Arthralgia of left shoulder region 01/02/2017  . Gastroesophageal reflux disease without esophagitis 01/02/2017  . History of right inguinal hernia repair 01/02/2017  . Hyperlipidemia 11/28/2015   Social History  Substance Use Topics  . Smoking status: Former Smoker    Types: Pipe    Quit date: 03/20/1981  . Smokeless tobacco: Never Used     Comment: Quit in 1983  . Alcohol use No   Current Medications and Allergies:   .  aspirin 81 MG tablet, Take 81 mg by mouth daily., Disp: , Rfl:  .  ferrous sulfate 325 (65 FE) MG tablet, Take 325 mg by mouth daily with breakfast., Disp: , Rfl:  .  FLORA-Q (FLORA-Q) CAPS capsule, Take 1 capsule by mouth daily., Disp: , Rfl:  .  KRILL OIL PO, Take 1 capsule by mouth daily., Disp: ,  Rfl:  .  magnesium gluconate (MAGONATE) 30 MG tablet, Take 400 mg by mouth daily., Disp: , Rfl:  .  Multiple Vitamins-Minerals (ICAPS AREDS FORMULA PO), Take 2 capsules by mouth daily. , Disp: , Rfl:  .  pantoprazole (PROTONIX) 40 MG tablet, Take 1 tablet (40 mg total) by mouth daily., Disp: 30 tablet, Rfl: 0 .  Red Yeast Rice 600 MG CAPS, Take 1,200 mg by mouth daily., Disp: , Rfl:  .  traZODone (DESYREL) 150 MG tablet, Take 1 tablet (150 mg total) by mouth at bedtime as needed for sleep., Disp: 90 tablet, Rfl: 2  No Known Allergies   Review of Systems   Pertinent items are noted in the HPI. Otherwise, ROS is negative.  Vitals:   Vitals:   07/06/17 0933  BP: 116/68  Pulse: (!) 56  Temp: 97.8 F (36.6 C)  TempSrc: Oral  SpO2: 98%  Weight: 167 lb (75.8 kg)  Height: 5\' 10"  (1.778 m)     Body mass index is 23.96 kg/m.   Physical Exam:   Physical Exam  Constitutional: He is oriented to person, place, and time. He appears well-developed and well-nourished. No distress.  HENT:  Head: Normocephalic and atraumatic.  Right Ear: External ear normal.  Left Ear: External ear normal.  Nose: Nose normal.  Mouth/Throat: Oropharynx is  clear and moist.  Eyes: Pupils are equal, round, and reactive to light. Conjunctivae and EOM are normal.  Neck: Normal range of motion. Neck supple.  Cardiovascular: Normal rate, regular rhythm, normal heart sounds and intact distal pulses.   Pulmonary/Chest: Effort normal and breath sounds normal.  Abdominal: Soft. Bowel sounds are normal.  Musculoskeletal: Normal range of motion.  Neurological: He is alert and oriented to person, place, and time.  Skin: Skin is warm and dry.  Psychiatric: He has a normal mood and affect. His behavior is normal. Judgment and thought content normal.  Nursing note and vitals reviewed.   Assessment and Plan:   [name] was seen today for follow-up.  Diagnoses and all orders for this visit:  Pure  hypercholesterolemia -     Comprehensive metabolic panel -     Lipid panel   . Reviewed expectations re: course of current medical issues. . Discussed self-management of symptoms. . Outlined signs and symptoms indicating need for more acute intervention. . Patient verbalized understanding and all questions were answered. Marland Kitchen Health Maintenance issues including appropriate healthy diet, exercise, and smoking avoidance were discussed with patient. . See orders for this visit as documented in the electronic medical record. . Patient received an After Visit Summary.  Briscoe Deutscher, DO [place] [place] 07/06/2017  Future Appointments Date Time Provider [place]  01/05/2018 8:00 AM Williemae Area, RN [facility] None

## 2017-07-06 NOTE — Progress Notes (Signed)
   Harold Leach is a 76 y.o. male is here for follow up.  History of Present Illness:   [name] acting as scribe for Dr. Juleen China.  HPI: Patient comes in today for 6 month follow up. Patient has no concerns today. We will order future lipid panel to recheck lipids. Patient has been doing the heart healthy diet and exercise to lower cholesterol. He is not fasting this AM. Physical Exam

## 2017-07-08 ENCOUNTER — Telehealth: Payer: Self-pay | Admitting: Family Medicine

## 2017-07-08 NOTE — Telephone Encounter (Signed)
Patient called in reference to wanting to go over results from "cholesterol test". Please call patient and advise. OK to leave message.

## 2017-07-09 NOTE — Telephone Encounter (Signed)
Attempted to call patient.  Got a message that call could not be completed "due to network difficulty".  Will try again later.

## 2017-07-10 NOTE — Telephone Encounter (Signed)
Left voicemail with results, per patient request.

## 2017-09-21 IMAGING — DX DG CHEST 1V PORT
1 series · 1 of 1 positions shown · non-contrast
Comparison: Chest radiograph performed earlier today at [DATE] p.m.

CLINICAL DATA: Chest tube placement. Assess residual pneumothorax.
Initial encounter.

EXAM:
PORTABLE CHEST 1 VIEW

[chest ap]
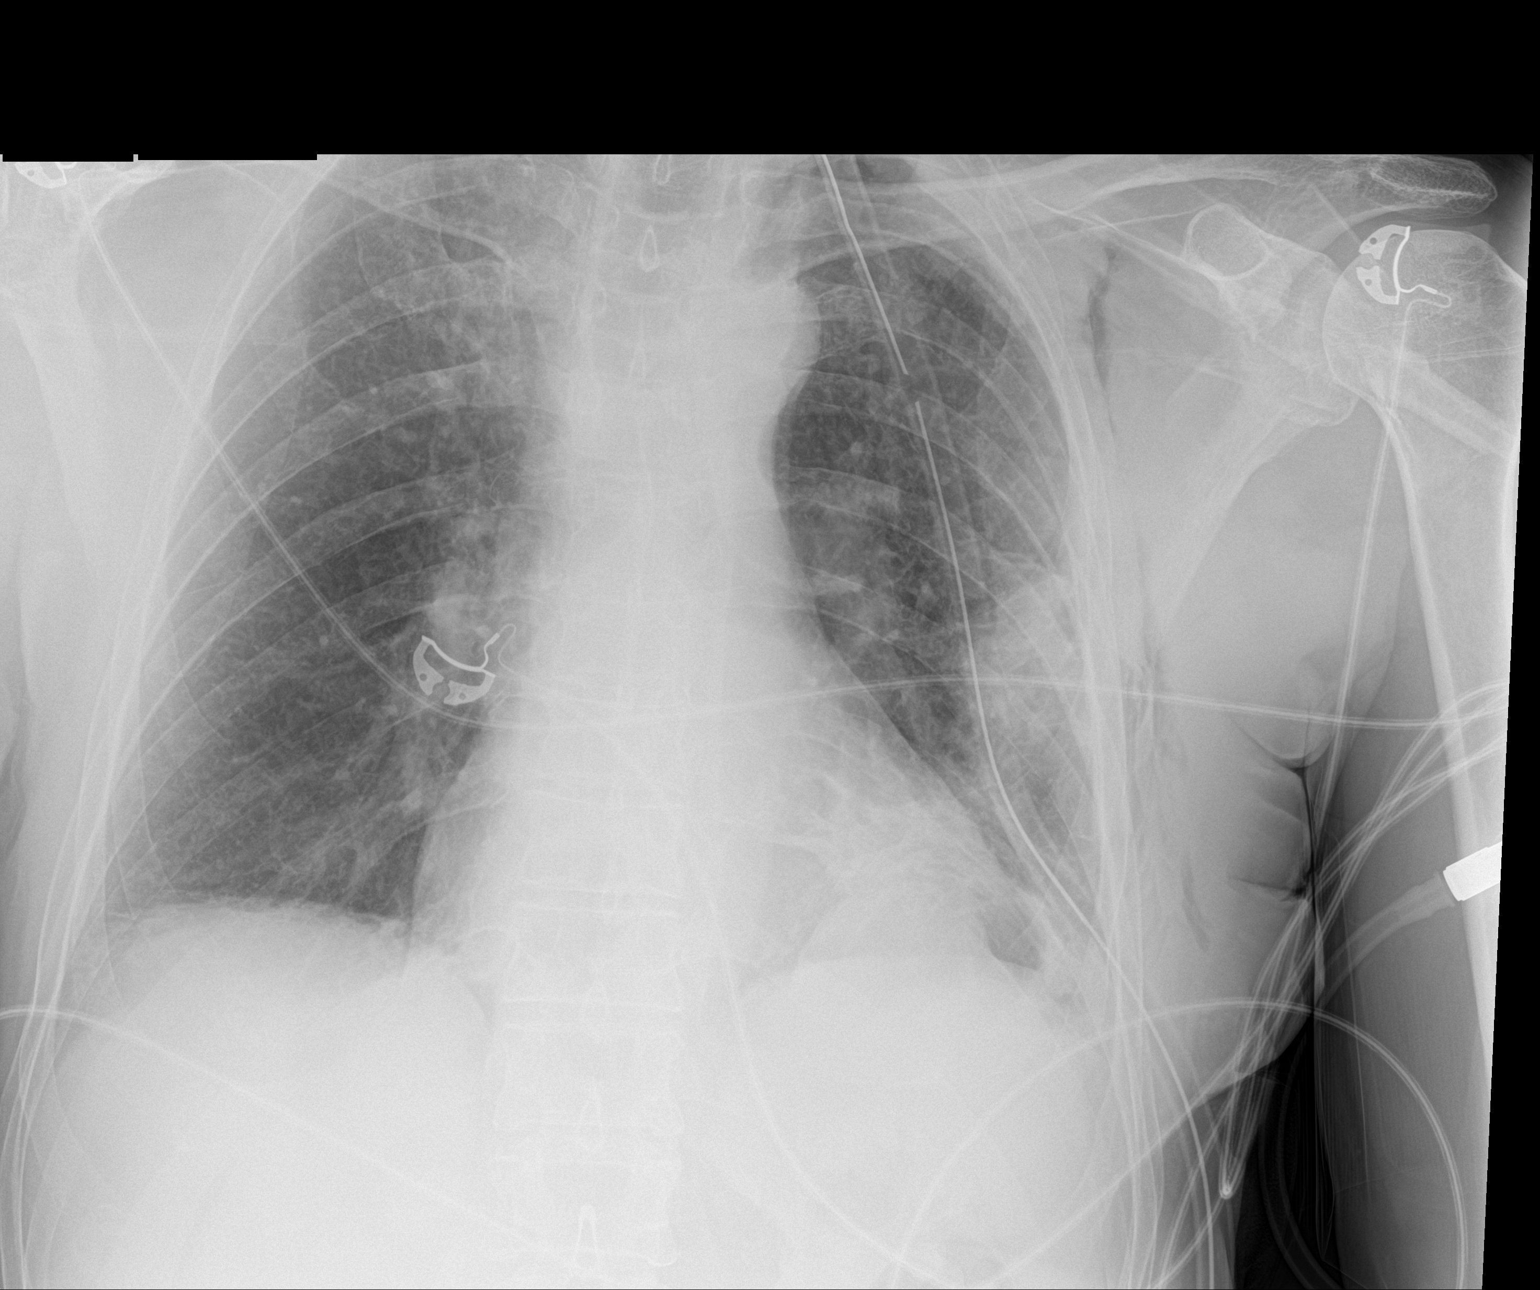

[1 of 1 positions shown; findings below may reference images not displayed]

FINDINGS: Status post placement of a left apical chest tube, the previously
noted pneumothorax has resolved. Patchy left basilar and mid lung
airspace opacities likely reflect atelectasis. Minimal right basilar
atelectasis is noted. Vascular congestion is seen. No significant
pleural effusion is identified.

The cardiomediastinal silhouette is borderline normal in size.
Multiple displaced left-sided rib fractures are again noted.
Scattered soft tissue air is noted along the left chest wall,
perhaps mildly improved from the prior study.
IMPRESSION: 1. Interval resolution of pneumothorax, status post placement of
left apical chest tube. Patchy left basilar and left midlung
airspace opacities likely reflect atelectasis. Minimal right basilar
atelectasis noted. Vascular congestion seen.
2. Multiple displaced left-sided rib fractures again noted.

## 2017-09-21 IMAGING — DX DG CHEST 1V PORT
1 series · 1 of 1 positions shown · non-contrast
Comparison: Prior film same day

CLINICAL DATA: Post decompression of left lung

EXAM:
PORTABLE CHEST 1 VIEW

[chest ap]
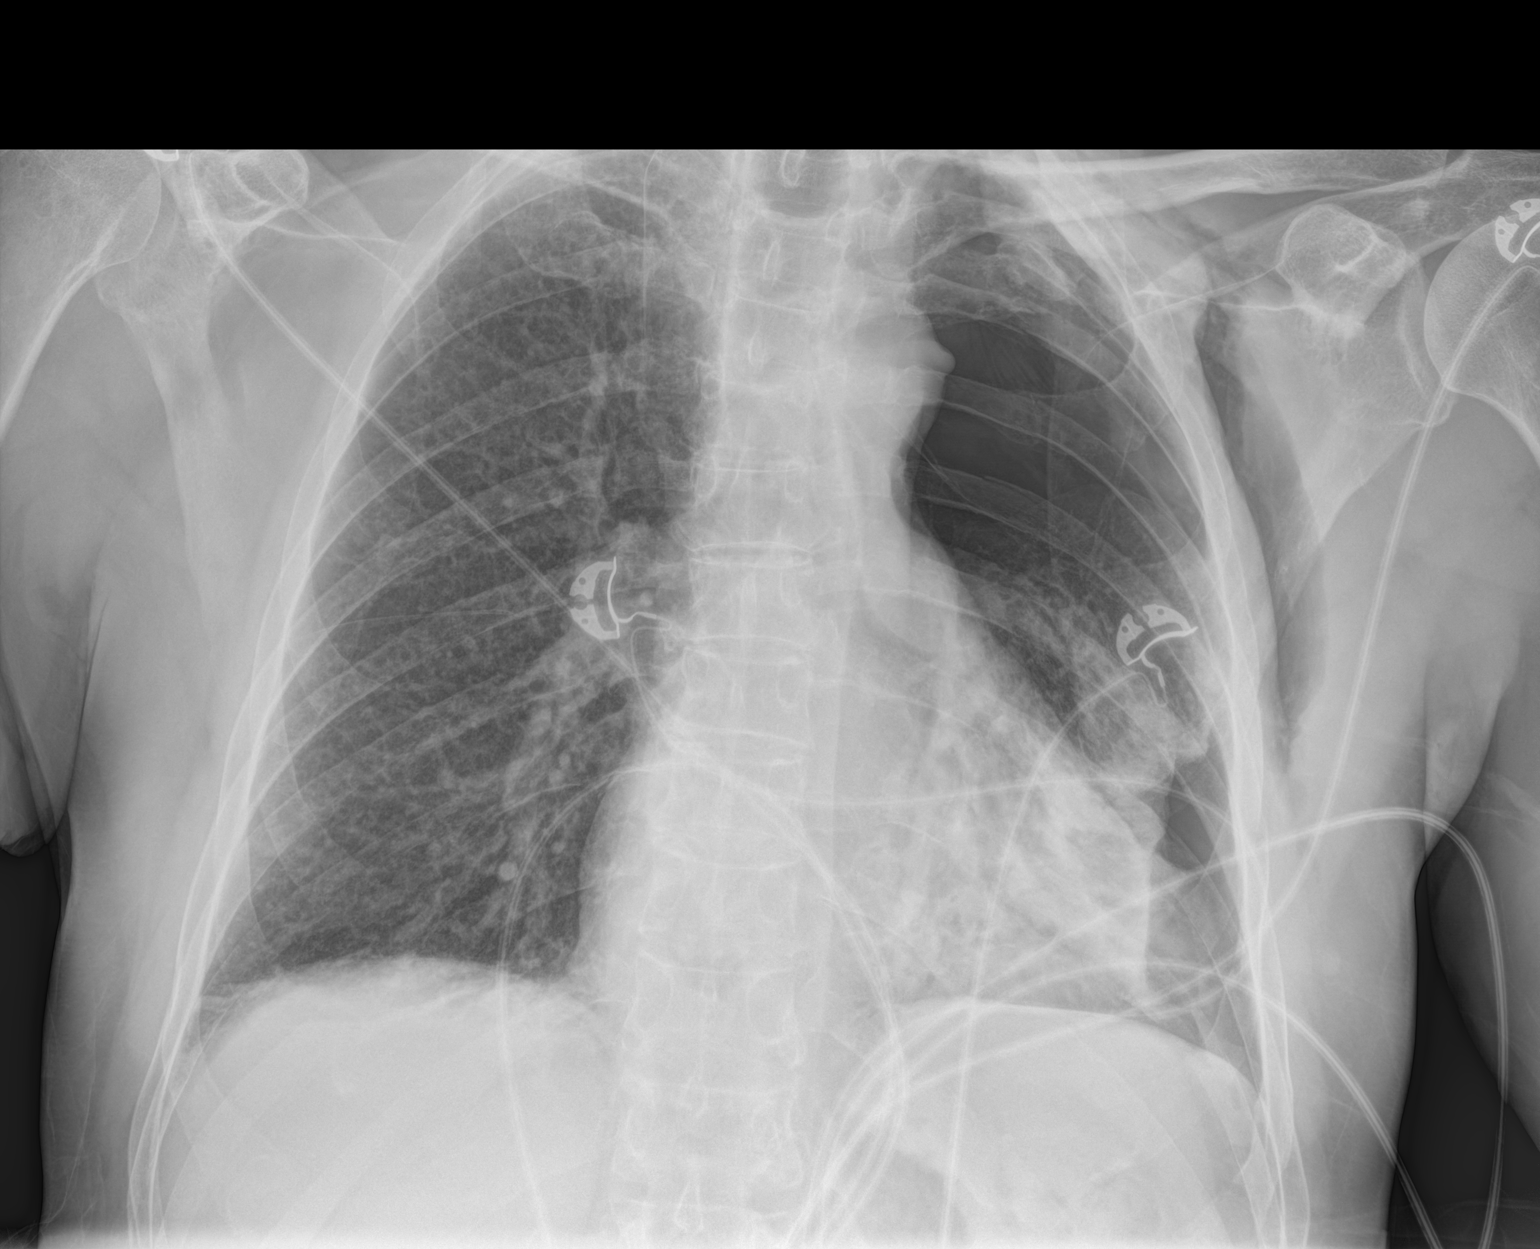

[1 of 1 positions shown; findings below may reference images not displayed]

FINDINGS: Multiple left upper rib fractures again noted. There is progression
of subcutaneous emphysema left upper chest wall. There is
progression of the left pneumothorax at least 45-50%. Atelectasis of
left lung. Right lung is clear.
IMPRESSION: There is progression of subcutaneous emphysema left upper chest
wall. There is progression of the left pneumothorax at least 45-50%.
Atelectasis of left lung. Multiple left upper rib fractures again
noted.

These results were called by telephone at the time of interpretation
on 11/12/2015 at [DATE] to Dr. RUDI JUMPER , who verbally acknowledged
these results.

## 2017-09-22 IMAGING — CR DG CHEST 1V PORT
1 series · 1 of 1 positions shown · non-contrast
Comparison: 11/12/2015.

CLINICAL DATA: Left chest tube.

EXAM:
PORTABLE CHEST 1 VIEW

[AP]
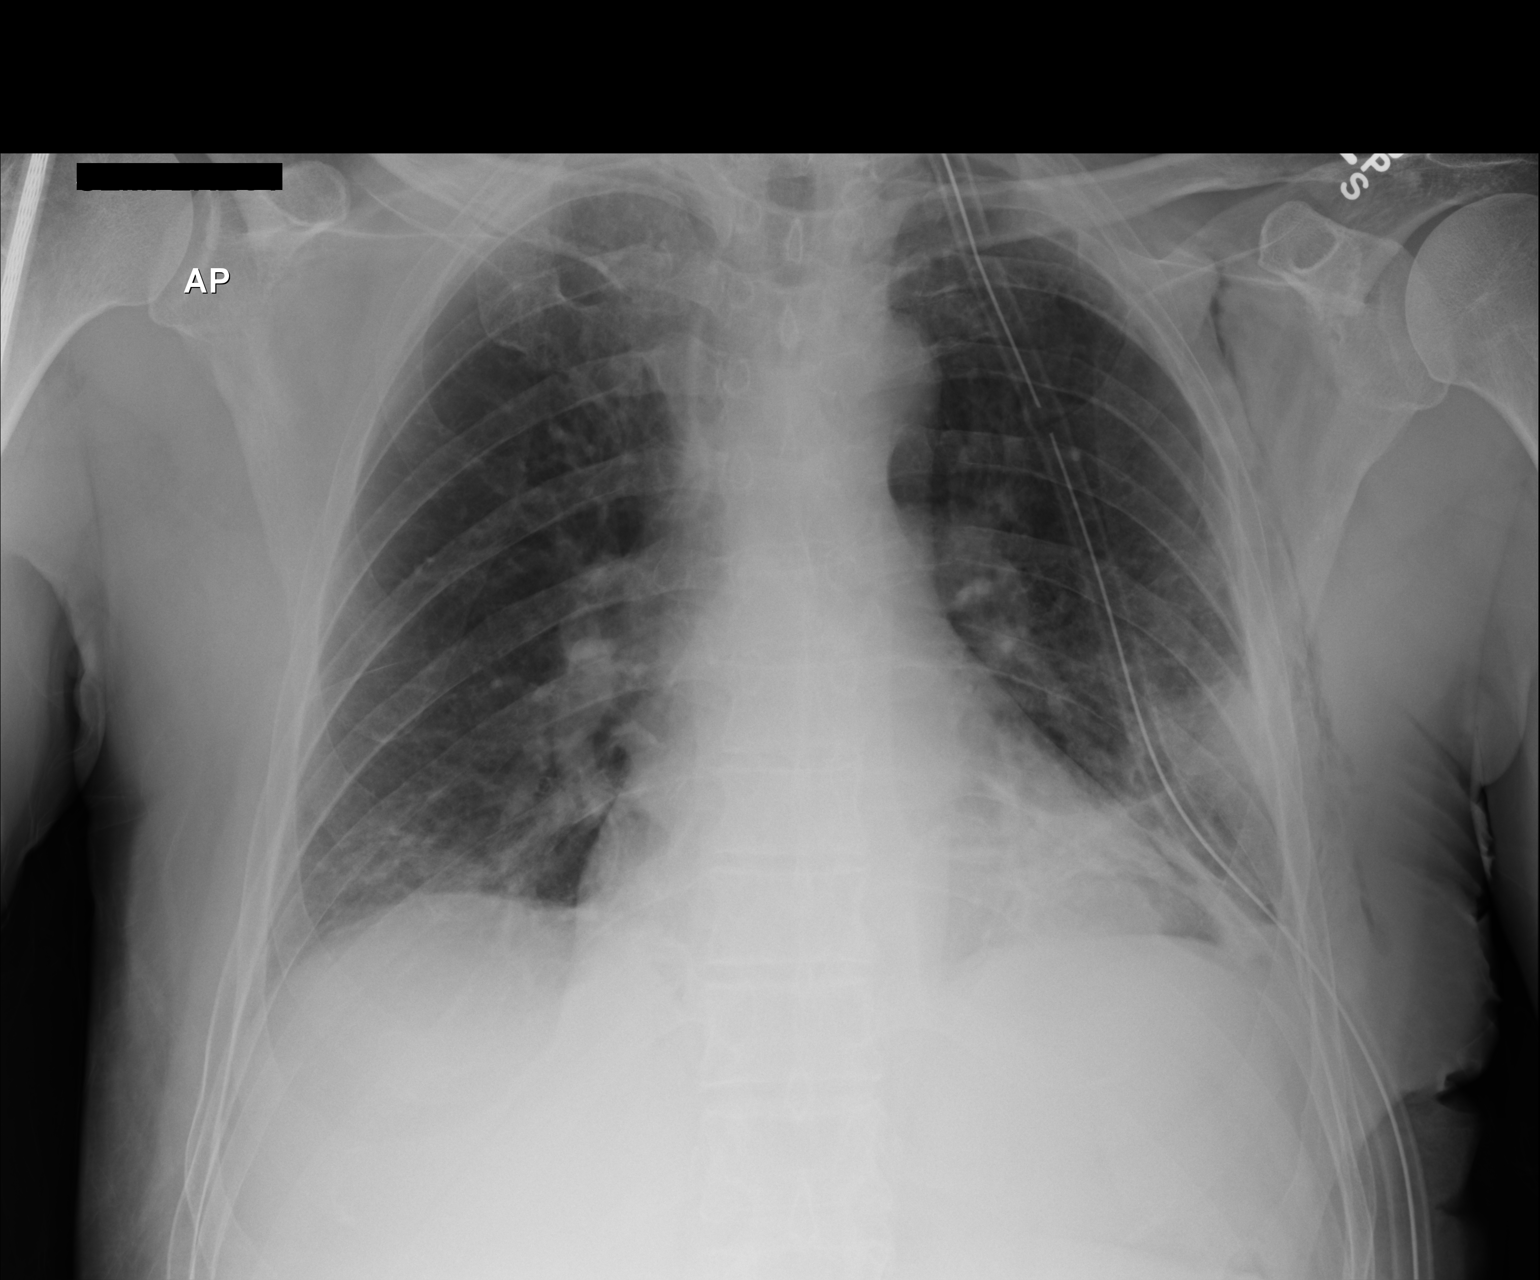

[1 of 1 positions shown; findings below may reference images not displayed]

FINDINGS: Left chest tube in stable position. No pneumothorax. Mediastinum
hilar structures are unremarkable. Persistent left base atelectatic
changes. Mild right base subsegmental atelectasis. Left chest wall
subcutaneous emphysema again noted.
IMPRESSION: 1. Left chest tube in stable position. No pneumothorax. Left chest
wall subcutaneous emphysema stable.
2. Persistent bibasilar atelectasis particular on the left lung
base.

## 2017-09-23 IMAGING — CR DG CHEST 1V PORT
1 series · 1 of 1 positions shown · non-contrast
Comparison: 11/14/2015

CLINICAL DATA: Chest tube removal.  Evaluate for pneumothorax.

EXAM:
PORTABLE CHEST 1 VIEW

[AP]
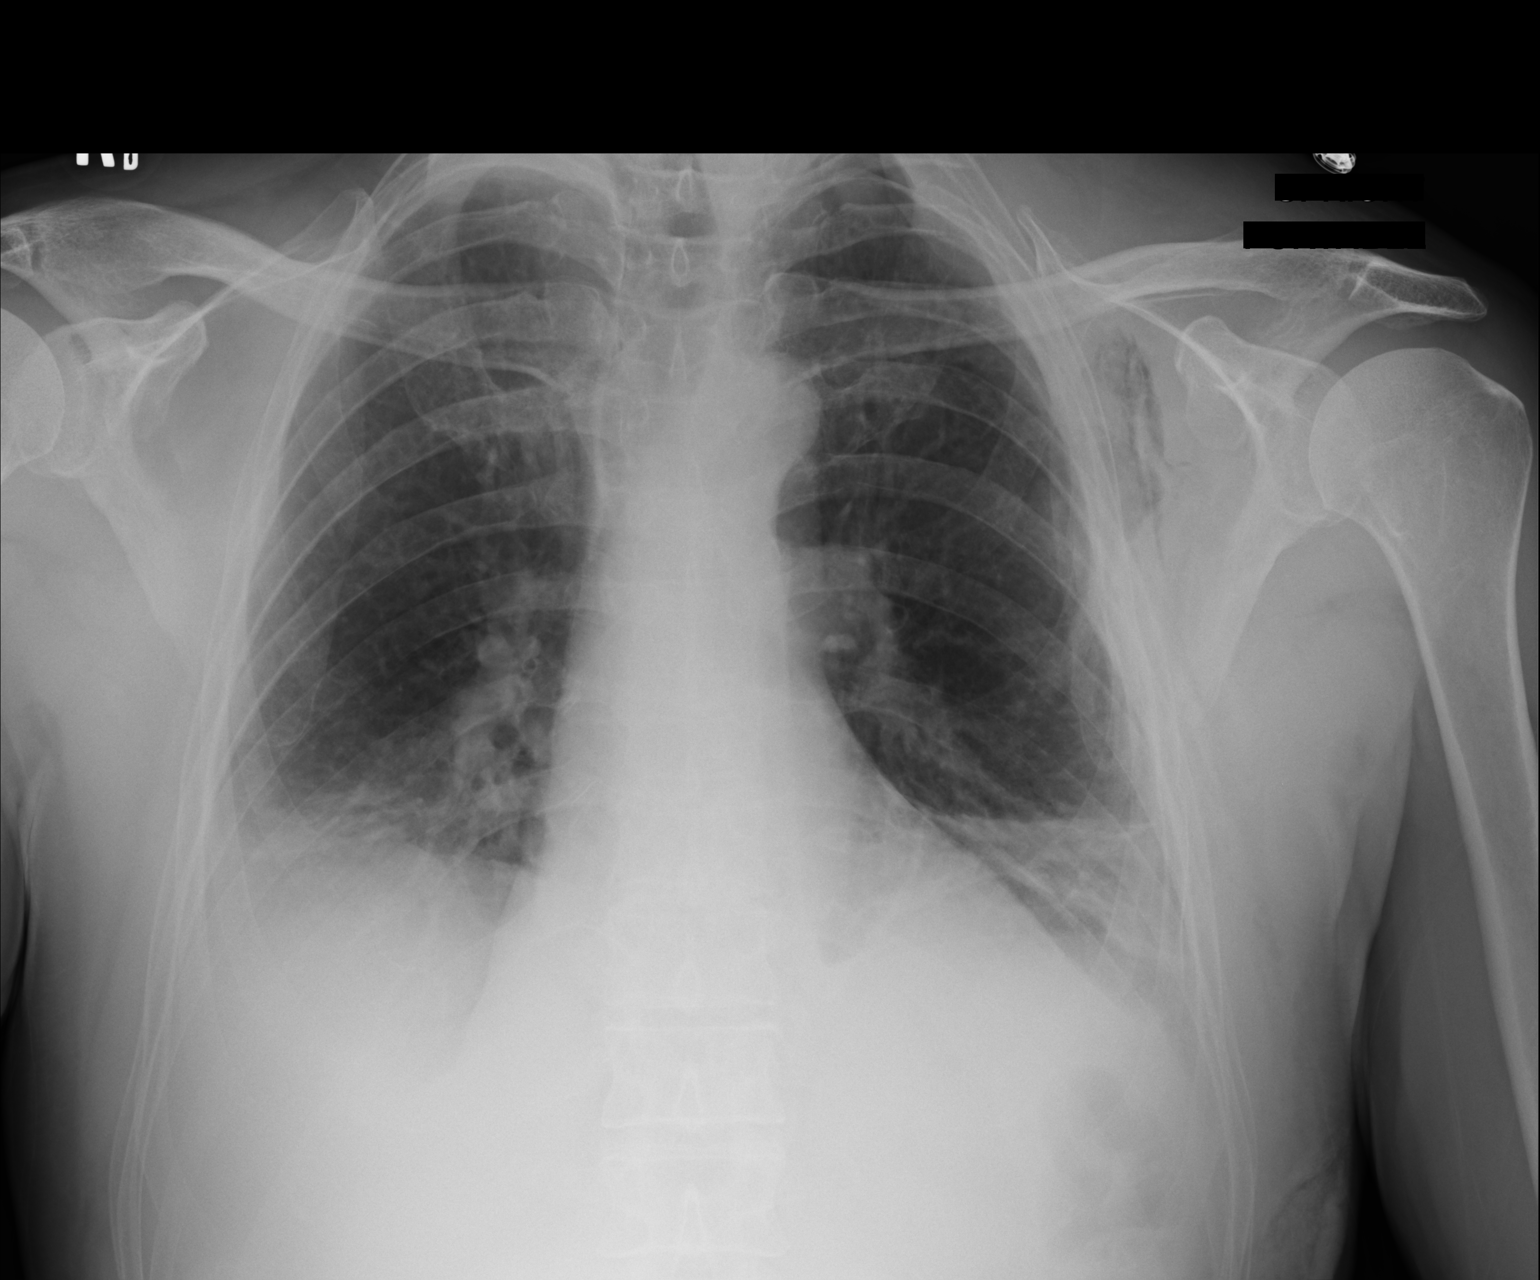

[1 of 1 positions shown; findings below may reference images not displayed]

FINDINGS: Left chest tube has been removed. The small left apical pneumothorax
has not changed in size. Again noted is subcutaneous gas. Bibasilar
densities are suggestive for atelectasis. Cannot exclude right
pleural fluid. Heart and mediastinum are stable. The trachea is
midline.
IMPRESSION: Removal of left chest tube.  Stable small left apical pneumothorax.

Probable bibasilar atelectasis. Cannot exclude a right pleural
effusion.

## 2017-10-03 ENCOUNTER — Other Ambulatory Visit: Payer: Self-pay | Admitting: Family Medicine

## 2017-10-03 DIAGNOSIS — G4700 Insomnia, unspecified: Secondary | ICD-10-CM

## 2017-10-05 NOTE — Telephone Encounter (Signed)
MEDICATION:   PHARMACY:  [facility]   IS THIS A 90 DAY SUPPLY : no   IS PATIENT OUT OF MEDICATION:   IF NOT; HOW MUCH IS LEFT:   LAST APPOINTMENT DATE: @10 /17/2018  NEXT APPOINTMENT DATE:@4 /16/2019  OTHER COMMENTS:    **Let patient know to contact pharmacy at the end of the day to make sure medication is ready. **  ** Please notify patient to allow 48-72 hours to process**  **Encourage patient to contact the pharmacy for refills or they can request refills through [facility]**

## 2017-12-28 ENCOUNTER — Other Ambulatory Visit: Payer: Self-pay

## 2017-12-28 MED ORDER — CLOBETASOL PROPIONATE 0.05 % EX CREA: 1.0000 "application " | TOPICAL_CREAM | Freq: Two times a day (BID) | CUTANEOUS | 0 refills | Status: DC

## 2018-01-05 ENCOUNTER — Ambulatory Visit: Payer: Medicare Other | Admitting: *Deleted

## 2018-01-12 DIAGNOSIS — D2239 Melanocytic nevi of other parts of face: Secondary | ICD-10-CM | POA: Diagnosis not present

## 2018-01-12 DIAGNOSIS — D485 Neoplasm of uncertain behavior of skin: Secondary | ICD-10-CM | POA: Diagnosis not present

## 2018-01-20 NOTE — Progress Notes (Signed)
Subjective:   Harold Leach is a 77 y.o. male who presents for Medicare Annual/Subsequent preventive examination.  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 Prostate cancer - Dr. Alinda Money following   Former smoker; quit 75 Was a pipe smoker; very rare cigarettes      Objective:    Vitals: BP 130/80   Pulse 63   Ht 5\' 10"  (1.778 m)   Wt 165 lb (74.8 kg)   SpO2 95%   BMI 23.68 kg/m   Body mass index is 23.68 kg/m.  Advanced Directives 01/21/2018 06/15/2017 01/02/2017 11/13/2015 11/12/2015  Does Patient Have a Medical Advance Directive? Yes Yes Yes Yes No  Type of Advance Directive - [place];Living will Living will;Healthcare Power of [place];Living will -  Copy of [place] in Chart? - - No - copy requested - -  Would patient like information on creating a medical advance directive? - - - - No - patient declined information    Tobacco Social History   Tobacco Use  Smoking Status Former Smoker  . Types: Pipe  . Last attempt to quit: 03/20/1981  . Years since quitting: 36.8  Smokeless Tobacco Never Used  Tobacco Comment   Quit in 1983 pipe smoker      Counseling given: Yes Comment: Quit in 1983 pipe smoker    Clinical Intake:     Past Medical History:  Diagnosis Date  . 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 Arthralgia of left shoulder region 01/02/2017  . Chronic insomnia 01/02/2017  . 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 Gastroesophageal reflux disease without esophagitis 01/02/2017  . Hyperlipidemia 11/28/2015  . Macular degeneration 01/02/2017  . Prostate cancer  [facility])    Past Surgical History:  Procedure Laterality Date  . HERNIA REPAIR    . PROSTATE SURGERY  2012   Family History  Problem Relation Age of Onset  . Heart disease Mother   . Hypertension Mother   . Prostate cancer Father   . Heart disease Maternal Grandmother   . Stroke Maternal Grandmother    Social History   Socioeconomic History  . Marital status: Married    Spouse name: Not on file  . Number of children: Not on file  . Years of education: Not on file  . Highest education level: Not on file  Occupational History  . Occupation: Retired  Scientific laboratory technician  . Financial resource strain: Not on file  . Food insecurity:    Worry: Not on file    Inability: Not on file  . Transportation needs:    Medical: Not on file    Non-medical: Not on file  Tobacco Use  . Smoking status: Former Smoker    Types: Pipe    Last attempt to quit: 03/20/1981    Years since quitting: 36.8  . Smokeless tobacco: Never Used  . Tobacco comment: Quit in 1983 pipe smoker   Substance and Sexual Activity  . Alcohol use: No  . Drug use: No  . Sexual activity: Yes    Partners: Female  Lifestyle  . Physical activity:  Days per week: Not on file    Minutes per session: Not on file  . Stress: Not on file  Relationships  . Social connections:    Talks on phone: Not on file    Gets together: Not on file    Attends religious service: Not on file    Active member of club or organization: Not on file    Attends meetings of clubs or organizations: Not on file    Relationship status: Not on file  Other Topics Concern  . Not on file  Social History Narrative  . Not on file    Outpatient Encounter Medications as of 01/21/2018  Medication Sig  . aspirin 81 MG tablet Take 81 mg by mouth daily.  . clobetasol cream (TEMOVATE) 6.26 % Apply 1 application topically 2 (two) times daily.  . ferrous sulfate 325 (65 FE) MG tablet Take 325 mg by mouth daily with breakfast.  . FLORA-Q (FLORA-Q) CAPS capsule  Take 1 capsule by mouth daily.  Marland Kitchen KRILL OIL PO Take 1 capsule by mouth daily.  . magnesium gluconate (MAGONATE) 30 MG tablet Take 400 mg by mouth daily.  . Multiple Vitamins-Minerals (ICAPS AREDS FORMULA PO) Take 2 capsules by mouth daily.   . pantoprazole (PROTONIX) 40 MG tablet Take 1 tablet (40 mg total) by mouth daily.  . Red Yeast Rice 600 MG CAPS Take 1,200 mg by mouth daily.  . traZODone (DESYREL) 150 MG tablet TAKE 1 TO 1 AND 1/2 TABLETS BY MOUTH AT BEDTIME AS NEEDED FOR SLEEP  . [DISCONTINUED] traZODone (DESYREL) 150 MG tablet Take 1 tablet (150 mg total) by mouth at bedtime as needed for sleep.   No facility-administered encounter medications on file as of 01/21/2018.     Activities of Daily Living In your present state of health, do you have any difficulty performing the following activities: 01/21/2018  Hearing? Y  Vision? N  Difficulty concentrating or making decisions? N  Some recent data might be hidden    Patient Care Team: Briscoe Deutscher, DO as PCP - General (Family Medicine)   Assessment:   This is a routine wellness examination for Jabron.  Exercise Activities and Dietary recommendations    Goals    . Patient Stated     Quit snacking at hs Try some of the protein ice cream. (Halo, Yasso bars and other ice cream with protein in it )        Fall Risk Fall Risk  01/21/2018 01/02/2017 01/02/2017  Falls in the past year? Yes No Yes  Comment feet tangled up in shop - -  Number falls in past yr: 1 - -  Follow up Education provided - -   Does walk one mile a day on self made trail at home with hills   Depression Screen PHQ 2/9 Scores 01/21/2018 01/02/2017 01/02/2017  PHQ - 2 Score 0 0 0    Cognitive Function MMSE - Mini Mental State Exam 01/02/2017  Orientation to time 5  Orientation to Place 5  Registration 3  Attention/ Calculation 5  Recall 3  Language- name 2 objects 2  Language- repeat 1  Language- follow 3 step command 3  Language- read & follow  direction 1  Write a sentence 1  Copy design 1  Total score 30     6CIT Screen 01/21/2018  What Year? 0 points  What month? 0 points  What time? 0 points  Count back from 20 0 points  Months in reverse 0 points  Repeat phrase 0 points  Total Score 0    Recall 3/3 serial 7's from 100   Immunization History  Administered Date(s) Administered  . Influenza Whole 06/29/2017  . Influenza, High Dose Seasonal PF 08/21/2016  . Influenza-Unspecified 08/21/2016  . Pneumococcal Conjugate-13 10/10/2015  . Tdap 09/12/2016     Screening Tests Health Maintenance  Topic Date Due  . INFLUENZA VACCINE  04/22/2018  . COLONOSCOPY  09/30/2025  . TETANUS/TDAP  09/12/2026  . PNA vac Low Risk Adult  Completed         Plan:      PCP Notes   Health Maintenance Educated regarding shingrix   Abnormal Screens  None   Referrals  none  Patient concerns; None;   Nurse Concerns; As noted  Next PCP apt 07/07/2018       I have personally reviewed and noted the following in the patient's chart:   . Medical and social history . Use of alcohol, tobacco or illicit drugs  . Current medications and supplements . Functional ability and status . Nutritional status . Physical activity . Advanced directives . List of other physicians . Hospitalizations, surgeries, and ER visits in previous 12 months . Vitals . Screenings to include cognitive, depression, and falls . Referrals and appointments  In addition, I have reviewed and discussed with patient certain preventive protocols, quality metrics, and best practice recommendations. A written personalized care plan for preventive services as well as general preventive health recommendations were provided to patient.     Wynetta Fines, RN  01/21/2018

## 2018-01-21 ENCOUNTER — Encounter: Payer: Self-pay | Admitting: *Deleted

## 2018-01-21 ENCOUNTER — Ambulatory Visit (INDEPENDENT_AMBULATORY_CARE_PROVIDER_SITE_OTHER): Payer: Medicare Other | Admitting: *Deleted

## 2018-01-21 VITALS — BP 130/80 | HR 63 | Ht 70.0 in | Wt 165.0 lb

## 2018-01-21 DIAGNOSIS — Z0000 Encounter for general adult medical examination without abnormal findings: Secondary | ICD-10-CM | POA: Diagnosis not present

## 2018-01-21 NOTE — Patient Instructions (Addendum)
Harold Leach , Thank you for taking time to come for your Medicare Wellness Visit. I appreciate your ongoing commitment to your health goals. Please review the following plan we discussed and let me know if I can assist you in the future.    Shingrix is a vaccine for the prevention of Shingles in Adults 50 and older.  If you are on Medicare, the shingrix is covered under your Part D plan, so you will take both of the vaccines in the series at your pharmacy. Please check with your benefits regarding applicable copays or out of pocket expenses.  The Shingrix is given in 2 vaccines approx 8 weeks apart. You must receive the 2nd dose prior to 6 months from receipt of the first. Please have the pharmacist print out you Immunization  dates for our office records    These are the goals we discussed: Goals    . Patient Stated     Quit snacking at hs Try some of the protein ice cream. (Halo, Yasso bars and other ice cream with protein in it )        This is a list of the screening recommended for you and due dates:  Health Maintenance  Topic Date Due  . Flu Shot  04/22/2018  . Colon Cancer Screening  09/30/2025  . Tetanus Vaccine  09/12/2026  . Pneumonia vaccines  Completed      Fall Prevention in the Home Falls can cause injuries. They can happen to people of all ages. There are many things you can do to make your home safe and to help prevent falls. What can I do on the outside of my home?  Regularly fix the edges of walkways and driveways and fix any cracks.  Remove anything that might make you trip as you walk through a door, such as a raised step or threshold.  Trim any bushes or trees on the path to your home.  Use bright outdoor lighting.  Clear any walking paths of anything that might make someone trip, such as rocks or tools.  Regularly check to see if handrails are loose or broken. Make sure that both sides of any steps have handrails.  Any raised decks and porches should  have guardrails on the edges.  Have any leaves, snow, or ice cleared regularly.  Use sand or salt on walking paths during winter.  Clean up any spills in your garage right away. This includes oil or grease spills. What can I do in the bathroom?  Use night lights.  Install grab bars by the toilet and in the tub and shower. Do not use towel bars as grab bars.  Use non-skid mats or decals in the tub or shower.  If you need to sit down in the shower, use a plastic, non-slip stool.  Keep the floor dry. Clean up any water that spills on the floor as soon as it happens.  Remove soap buildup in the tub or shower regularly.  Attach bath mats securely with double-sided non-slip rug tape.  Do not have throw rugs and other things on the floor that can make you trip. What can I do in the bedroom?  Use night lights.  Make sure that you have a light by your bed that is easy to reach.  Do not use any sheets or blankets that are too big for your bed. They should not hang down onto the floor.  Have a firm chair that has side arms. You can use  this for support while you get dressed.  Do not have throw rugs and other things on the floor that can make you trip. What can I do in the kitchen?  Clean up any spills right away.  Avoid walking on wet floors.  Keep items that you use a lot in easy-to-reach places.  If you need to reach something above you, use a strong step stool that has a grab bar.  Keep electrical cords out of the way.  Do not use floor polish or wax that makes floors slippery. If you must use wax, use non-skid floor wax.  Do not have throw rugs and other things on the floor that can make you trip. What can I do with my stairs?  Do not leave any items on the stairs.  Make sure that there are handrails on both sides of the stairs and use them. Fix handrails that are broken or loose. Make sure that handrails are as long as the stairways.  Check any carpeting to make sure  that it is firmly attached to the stairs. Fix any carpet that is loose or worn.  Avoid having throw rugs at the top or bottom of the stairs. If you do have throw rugs, attach them to the floor with carpet tape.  Make sure that you have a light switch at the top of the stairs and the bottom of the stairs. If you do not have them, ask someone to add them for you. What else can I do to help prevent falls?  Wear shoes that: ? Do not have high heels. ? Have rubber bottoms. ? Are comfortable and fit you well. ? Are closed at the toe. Do not wear sandals.  If you use a stepladder: ? Make sure that it is fully opened. Do not climb a closed stepladder. ? Make sure that both sides of the stepladder are locked into place. ? Ask someone to hold it for you, if possible.  Clearly mark and make sure that you can see: ? Any grab bars or handrails. ? First and last steps. ? Where the edge of each step is.  Use tools that help you move around (mobility aids) if they are needed. These include: ? Canes. ? Walkers. ? Scooters. ? Crutches.  Turn on the lights when you go into a dark area. Replace any light bulbs as soon as they burn out.  Set up your furniture so you have a clear path. Avoid moving your furniture around.  If any of your floors are uneven, fix them.  If there are any pets around you, be aware of where they are.  Review your medicines with your doctor. Some medicines can make you feel dizzy. This can increase your chance of falling. Ask your doctor what other things that you can do to help prevent falls. This information is not intended to replace advice given to you by your health care provider. Make sure you discuss any questions you have with your health care provider. Document Released: 07/05/2009 Document Revised: 02/14/2016 Document Reviewed: 10/13/2014 Elsevier Interactive Patient Education  2018 [place] Maintenance, Male A healthy lifestyle and preventive  care is important for your health and wellness. Ask your health care provider about what schedule of regular examinations is right for you. What should I know about weight and diet? Eat a Healthy Diet  Eat plenty of vegetables, fruits, whole grains, low-fat dairy products, and lean protein.  Do not eat a lot of  foods high in solid fats, added sugars, or salt.  Maintain a Healthy Weight Regular exercise can help you achieve or maintain a healthy weight. You should:  Do at least 150 minutes of exercise each week. The exercise should increase your heart rate and make you sweat (moderate-intensity exercise).  Do strength-training exercises at least twice a week.  Watch Your Levels of Cholesterol and Blood Lipids  Have your blood tested for lipids and cholesterol every 5 years starting at 75 years of age. If you are at high risk for heart disease, you should start having your blood tested when you are 75 years old. You may need to have your cholesterol levels checked more often if: ? Your lipid or cholesterol levels are high. ? You are older than 75 years of age. ? You are at high risk for heart disease.  What should I know about cancer screening? Many types of cancers can be detected early and may often be prevented. Lung Cancer  You should be screened every year for lung cancer if: ? You are a current smoker who has smoked for at least 30 years. ? You are a former smoker who has quit within the past 15 years.  Talk to your health care provider about your screening options, when you should start screening, and how often you should be screened.  Colorectal Cancer  Routine colorectal cancer screening usually begins at 75 years of age and should be repeated every 5-10 years until you are 75 years old. You may need to be screened more often if early forms of precancerous polyps or small growths are found. Your health care provider may recommend screening at an earlier age if you have risk  factors for colon cancer.  Your health care provider may recommend using home test kits to check for hidden blood in the stool.  A small camera at the end of a tube can be used to examine your colon (sigmoidoscopy or colonoscopy). This checks for the earliest forms of colorectal cancer.  Prostate and Testicular Cancer  Depending on your age and overall health, your health care provider may do certain tests to screen for prostate and testicular cancer.  Talk to your health care provider about any symptoms or concerns you have about testicular or prostate cancer.  Skin Cancer  Check your skin from head to toe regularly.  Tell your health care provider about any new moles or changes in moles, especially if: ? There is a change in a mole's size, shape, or color. ? You have a mole that is larger than a pencil eraser.  Always use sunscreen. Apply sunscreen liberally and repeat throughout the day.  Protect yourself by wearing long sleeves, pants, a wide-brimmed hat, and sunglasses when outside.  What should I know about heart disease, diabetes, and high blood pressure?  If you are 45-60 years of age, have your blood pressure checked every 3-5 years. If you are 4 years of age or older, have your blood pressure checked every year. You should have your blood pressure measured twice-once when you are at a hospital or clinic, and once when you are not at a hospital or clinic. Record the average of the two measurements. To check your blood pressure when you are not at a hospital or clinic, you can use: ? An automated blood pressure machine at a pharmacy. ? A home blood pressure monitor.  Talk to your health care provider about your target blood pressure.  If you are between  46-37 years old, ask your health care provider if you should take aspirin to prevent heart disease.  Have regular diabetes screenings by checking your fasting blood sugar level. ? If you are at a normal weight and have a  low risk for diabetes, have this test once every three years after the age of 18. ? If you are overweight and have a high risk for diabetes, consider being tested at a younger age or more often.  A one-time screening for abdominal aortic aneurysm (AAA) by ultrasound is recommended for men aged 29-75 years who are current or former smokers. What should I know about preventing infection? Hepatitis B If you have a higher risk for hepatitis B, you should be screened for this virus. Talk with your health care provider to find out if you are at risk for hepatitis B infection. Hepatitis C Blood testing is recommended for:  Everyone born from [birth year] through 1963.  Anyone with known risk factors for hepatitis C.  Sexually Transmitted Diseases (STDs)  You should be screened each year for STDs including gonorrhea and chlamydia if: ? You are sexually active and are younger than 76 years of age. ? You are older than 75 years of age and your health care provider tells you that you are at risk for this type of infection. ? Your sexual activity has changed since you were last screened and you are at an increased risk for chlamydia or gonorrhea. Ask your health care provider if you are at risk.  Talk with your health care provider about whether you are at high risk of being infected with HIV. Your health care provider may recommend a prescription medicine to help prevent HIV infection.  What else can I do?  Schedule regular health, dental, and eye exams.  Stay current with your vaccines (immunizations).  Do not use any tobacco products, such as cigarettes, chewing tobacco, and e-cigarettes. If you need help quitting, ask your health care provider.  Limit alcohol intake to no more than 2 drinks per day. One drink equals 12 ounces of beer, 5 ounces of wine, or 1 ounces of hard liquor.  Do not use street drugs.  Do not share needles.  Ask your health care provider for help if you need support or  information about quitting drugs.  Tell your health care provider if you often feel depressed.  Tell your health care provider if you have ever been abused or do not feel safe at home. This information is not intended to replace advice given to you by your health care provider. Make sure you discuss any questions you have with your health care provider. Document Released: 03/06/2008 Document Revised: 05/07/2016 Document Reviewed: 06/12/2015 Elsevier Interactive Patient Education  [employer].

## 2018-01-22 ENCOUNTER — Encounter: Payer: Self-pay | Admitting: *Deleted

## 2018-01-22 NOTE — Progress Notes (Signed)
I have personally reviewed the Medicare Annual Wellness Visit and agree with the assessment and plan.  Algis Greenhouse. Jerline Pain, MD 01/22/2018 7:58 AM

## 2018-03-29 ENCOUNTER — Other Ambulatory Visit: Payer: Self-pay

## 2018-03-29 ENCOUNTER — Other Ambulatory Visit: Payer: Self-pay | Admitting: Family Medicine

## 2018-03-29 MED ORDER — PANTOPRAZOLE SODIUM 40 MG PO TBEC: 40.0000 mg | DELAYED_RELEASE_TABLET | Freq: Every day | ORAL | 0 refills | Status: DC

## 2018-04-07 ENCOUNTER — Other Ambulatory Visit: Payer: Self-pay | Admitting: Family Medicine

## 2018-04-07 DIAGNOSIS — G4700 Insomnia, unspecified: Secondary | ICD-10-CM

## 2018-04-09 DIAGNOSIS — Z8546 Personal history of malignant neoplasm of prostate: Secondary | ICD-10-CM | POA: Diagnosis not present

## 2018-04-09 DIAGNOSIS — N393 Stress incontinence (female) (male): Secondary | ICD-10-CM | POA: Diagnosis not present

## 2018-04-09 LAB — PSA

## 2018-04-14 DIAGNOSIS — N393 Stress incontinence (female) (male): Secondary | ICD-10-CM | POA: Insufficient documentation

## 2018-04-14 DIAGNOSIS — N529 Male erectile dysfunction, unspecified: Secondary | ICD-10-CM | POA: Insufficient documentation

## 2018-04-26 ENCOUNTER — Other Ambulatory Visit: Payer: Self-pay | Admitting: Family Medicine

## 2018-05-26 DIAGNOSIS — H0100A Unspecified blepharitis right eye, upper and lower eyelids: Secondary | ICD-10-CM | POA: Diagnosis not present

## 2018-05-26 DIAGNOSIS — H5203 Hypermetropia, bilateral: Secondary | ICD-10-CM | POA: Diagnosis not present

## 2018-05-26 DIAGNOSIS — H52203 Unspecified astigmatism, bilateral: Secondary | ICD-10-CM | POA: Diagnosis not present

## 2018-05-26 DIAGNOSIS — H25813 Combined forms of age-related cataract, bilateral: Secondary | ICD-10-CM | POA: Diagnosis not present

## 2018-07-04 ENCOUNTER — Other Ambulatory Visit: Payer: Self-pay | Admitting: Family Medicine

## 2018-07-04 DIAGNOSIS — G4700 Insomnia, unspecified: Secondary | ICD-10-CM

## 2018-07-06 ENCOUNTER — Ambulatory Visit: Payer: Medicare Other | Admitting: Family Medicine

## 2018-07-06 NOTE — Progress Notes (Signed)
Harold Leach is a 77 y.o. male is here for follow up.  History of Present Illness:   HPI:   1. Pure hypercholesterolemia. Doing well. On red yeast rice. No myalgias.   2. Chronic insomnia. Controlled with Trazodone. Can't sleep without it. No side effects.    3. Gastroesophageal reflux disease without esophagitis. Controlled with Protonix daily.    Review of Systems  Constitutional: Negative for chills, diaphoresis, fever, malaise/fatigue and weight loss.  HENT: Negative for hearing loss.   Eyes: Negative for blurred vision.  Respiratory: Negative for cough, shortness of breath and wheezing.   Cardiovascular: Negative for chest pain, palpitations and leg swelling.  Gastrointestinal: Negative for abdominal pain, constipation, diarrhea, heartburn, nausea and vomiting.  Genitourinary: Negative for dysuria, flank pain, frequency, hematuria and urgency.  Musculoskeletal: Negative for joint pain and myalgias.  Skin: Negative for rash.  Neurological: Negative for dizziness, weakness and headaches.  Psychiatric/Behavioral: Negative for depression, substance abuse and suicidal ideas. The patient is not nervous/anxious and does not have insomnia.    There are no preventive care reminders to display for this patient. Depression screen [facility] 2/9 01/21/2018 01/02/2017 01/02/2017  Decreased Interest 0 0 0  Down, Depressed, Hopeless 0 0 0  PHQ - 2 Score 0 0 0   PMHx, SurgHx, SocialHx, FamHx, Medications, and Allergies were reviewed in the Visit Navigator and updated as appropriate.   Patient Active Problem List   Diagnosis Date Noted  . 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 ED (erectile dysfunction) 04/14/2018  . Stress incontinence 04/14/2018  . Chronic insomnia 01/02/2017  . Macular degeneration 01/02/2017  . 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 Arthralgia of left shoulder region 01/02/2017  . Gastroesophageal reflux disease  without esophagitis 01/02/2017  . History of right inguinal hernia repair 01/02/2017  . Hyperlipidemia 11/28/2015   Social History   Tobacco Use  . Smoking status: Former Smoker    Types: Pipe    Last attempt to quit: 03/20/1981    Years since quitting: 37.3  . Smokeless tobacco: Never Used  . Tobacco comment: Quit in 1983 pipe smoker   Substance Use Topics  . Alcohol use: No  . Drug use: No   Current Medications and Allergies:   .  aspirin 81 MG tablet, Take 81 mg by mouth daily., Disp: , Rfl:  .  clobetasol cream (TEMOVATE) 2.62 %, Apply 1 application topically 2 (two) times daily., Disp: 30 g, Rfl: 0 .  ferrous sulfate 325 (65 FE) MG tablet, Take 325 mg by mouth daily with breakfast., Disp: , Rfl:  .  FLORA-Q (FLORA-Q) CAPS capsule, Take 1 capsule by mouth daily., Disp: , Rfl:  .  KRILL OIL PO, Take 1 capsule by mouth daily., Disp: , Rfl:  .  magnesium gluconate (MAGONATE) 30 MG tablet, Take 400 mg by mouth daily., Disp: , Rfl:  .  Multiple Vitamins-Minerals (ICAPS AREDS FORMULA PO), Take 2 capsules by mouth daily. , Disp: , Rfl:  .  pantoprazole (PROTONIX) 40 MG tablet, TAKE 1 TABLET(40 MG) BY MOUTH DAILY, Disp: 90 tablet, Rfl: 3 .  Red Yeast Rice 600 MG CAPS, Take 1,200 mg by mouth daily., Disp: , Rfl:  .  traZODone (DESYREL) 150 MG tablet, TAKE 1 TO 1 AND 1/2 TABLETS BY MOUTH AT BEDTIME AS NEEDED FOR SLEEP, Disp: 90 tablet, Rfl: 3  No Known Allergies   Review of Systems   Pertinent items are noted in the  HPI. Otherwise, ROS is negative.  Vitals:   Vitals:   07/07/18 0941  BP: 124/66  Pulse: (!) 54  Temp: 97.8 F (36.6 C)  TempSrc: Oral  SpO2: 98%  Weight: 167 lb 9.6 oz (76 kg)     Body mass index is 24.05 kg/m.  Physical Exam:   Physical Exam  Constitutional: He is oriented to person, place, and time. He appears well-developed and well-nourished. No distress.  HENT:  Head: Normocephalic and atraumatic.  Right Ear: External ear normal.  Left Ear: External  ear normal.  Nose: Nose normal.  Mouth/Throat: Oropharynx is clear and moist.  Eyes: Pupils are equal, round, and reactive to light. Conjunctivae and EOM are normal.  Neck: Normal range of motion. Neck supple.  Cardiovascular: Normal rate, regular rhythm, normal heart sounds and intact distal pulses.  Pulmonary/Chest: Effort normal and breath sounds normal.  Abdominal: Soft. Bowel sounds are normal.  Musculoskeletal: Normal range of motion.  Neurological: He is alert and oriented to person, place, and time.  Skin: Skin is warm and dry.  Psychiatric: He has a normal mood and affect. His behavior is normal. Judgment and thought content normal.  Nursing note and vitals reviewed.  Assessment and Plan:   Harold Leach was seen today for follow-up.  Diagnoses and all orders for this visit:  Pure hypercholesterolemia -     Comprehensive metabolic panel -     Lipid panel  Gastroesophageal reflux disease without esophagitis -     pantoprazole (PROTONIX) 40 MG tablet; TAKE 1 TABLET(40 MG) BY MOUTH DAILY  Hx of iron deficiency anemia -     CBC with Differential/Platelet  Insomnia, unspecified type Comments: Refill needed today. 90 day Rx requested. Orders: -     traZODone (DESYREL) 150 MG tablet; TAKE 1 TO 1 AND 1/2 TABLETS BY MOUTH AT BEDTIME AS NEEDED FOR SLEEP  History of prostatectomy  Encounter for immunization -     Flu vaccine HIGH DOSE PF   . Reviewed expectations re: course of current medical issues. . Discussed self-management of symptoms. . Outlined signs and symptoms indicating need for more acute intervention. . Patient verbalized understanding and all questions were answered. Marland Kitchen Health Maintenance issues including appropriate healthy diet, exercise, and smoking avoidance were discussed with patient. . See orders for this visit as documented in the electronic medical record. . Patient received an After Visit Summary.  Harold Deutscher, DO [place], [place]  [facility] 07/08/2018

## 2018-07-07 ENCOUNTER — Encounter: Payer: Self-pay | Admitting: Family Medicine

## 2018-07-07 ENCOUNTER — Ambulatory Visit (INDEPENDENT_AMBULATORY_CARE_PROVIDER_SITE_OTHER): Payer: Medicare Other | Admitting: Family Medicine

## 2018-07-07 VITALS — BP 124/66 | HR 54 | Temp 97.8°F | Wt 167.6 lb

## 2018-07-07 DIAGNOSIS — E7800 Pure hypercholesterolemia, unspecified: Secondary | ICD-10-CM

## 2018-07-07 DIAGNOSIS — Z23 Encounter for immunization: Secondary | ICD-10-CM

## 2018-07-07 DIAGNOSIS — Z862 Personal history of diseases of the blood and blood-forming organs and certain disorders involving the immune mechanism: Secondary | ICD-10-CM

## 2018-07-07 DIAGNOSIS — F5104 Psychophysiologic insomnia: Secondary | ICD-10-CM | POA: Diagnosis not present

## 2018-07-07 DIAGNOSIS — K219 Gastro-esophageal reflux disease without esophagitis: Secondary | ICD-10-CM | POA: Diagnosis not present

## 2018-07-07 DIAGNOSIS — G4700 Insomnia, unspecified: Secondary | ICD-10-CM

## 2018-07-07 DIAGNOSIS — Z9079 Acquired absence of other genital organ(s): Secondary | ICD-10-CM | POA: Diagnosis not present

## 2018-07-07 LAB — COMPREHENSIVE METABOLIC PANEL
ALT: 10 U/L (ref 0–53)
AST: 15 U/L (ref 0–37)
Albumin: 4.3 g/dL (ref 3.5–5.2)
Alkaline Phosphatase: 50 U/L (ref 39–117)
BUN: 24 mg/dL — ABNORMAL HIGH (ref 6–23)
CO2: 32 mEq/L (ref 19–32)
Calcium: 9.3 mg/dL (ref 8.4–10.5)
Chloride: 103 mEq/L (ref 96–112)
Creatinine, Ser: 1.23 mg/dL (ref 0.40–1.50)
GFR: 60.98 mL/min (ref >60.00)
Glucose, Bld: 81 mg/dL (ref 70–99)
Potassium: 4.6 mEq/L (ref 3.5–5.1)
Sodium: 141 mEq/L (ref 135–145)
Total Bilirubin: 0.5 mg/dL (ref 0.2–1.2)
Total Protein: 6.7 g/dL (ref 6.0–8.3)

## 2018-07-07 LAB — CBC WITH DIFFERENTIAL/PLATELET
Basophils Absolute: 0 10*3/uL (ref 0.0–0.1)
Basophils Relative: 0.8 % (ref 0.0–3.0)
Eosinophils Absolute: 0.2 10*3/uL (ref 0.0–0.7)
Eosinophils Relative: 2.8 % (ref 0.0–5.0)
HCT: 41.7 % (ref 39.0–52.0)
Hemoglobin: 14.2 g/dL (ref 13.0–17.0)
Lymphocytes Relative: 24.7 % (ref 12.0–46.0)
Lymphs Abs: 1.5 10*3/uL (ref 0.7–4.0)
MCHC: 34.1 g/dL (ref 30.0–36.0)
MCV: 92.1 fl (ref 78.0–100.0)
Monocytes Absolute: 0.5 10*3/uL (ref 0.1–1.0)
Monocytes Relative: 9 % (ref 3.0–12.0)
Neutro Abs: 3.8 10*3/uL (ref 1.4–7.7)
Neutrophils Relative %: 62.7 % (ref 43.0–77.0)
Platelets: 168 10*3/uL (ref 150.0–400.0)
RBC: 4.53 Mil/uL (ref 4.22–5.81)
RDW: 13.4 % (ref 11.5–15.5)
WBC: 6 10*3/uL (ref 4.0–10.5)

## 2018-07-07 LAB — LIPID PANEL
Cholesterol: 201 mg/dL — ABNORMAL HIGH (ref 0–200)
HDL: 50.9 mg/dL (ref >39.00)
LDL Cholesterol: 132 mg/dL — ABNORMAL HIGH (ref 0–99)
NonHDL: 149.73
Total CHOL/HDL Ratio: 4
Triglycerides: 88 mg/dL (ref 0.0–149.0)
VLDL: 17.6 mg/dL (ref 0.0–40.0)

## 2018-07-07 MED ORDER — PANTOPRAZOLE SODIUM 40 MG PO TBEC: DELAYED_RELEASE_TABLET | ORAL | 3 refills | Status: DC

## 2018-07-07 MED ORDER — TRAZODONE HCL 150 MG PO TABS: ORAL_TABLET | ORAL | 3 refills | Status: DC

## 2018-10-10 ENCOUNTER — Other Ambulatory Visit: Payer: Self-pay | Admitting: Family Medicine

## 2018-10-10 DIAGNOSIS — F5104 Psychophysiologic insomnia: Secondary | ICD-10-CM

## 2018-10-10 DIAGNOSIS — G4700 Insomnia, unspecified: Secondary | ICD-10-CM

## 2018-10-11 ENCOUNTER — Ambulatory Visit (INDEPENDENT_AMBULATORY_CARE_PROVIDER_SITE_OTHER): Payer: Medicare Other | Admitting: Physician Assistant

## 2018-10-11 ENCOUNTER — Encounter: Payer: Self-pay | Admitting: Physician Assistant

## 2018-10-11 VITALS — BP 118/80 | HR 57 | Temp 98.5°F | Ht 70.0 in | Wt 172.2 lb

## 2018-10-11 DIAGNOSIS — R059 Cough, unspecified: Secondary | ICD-10-CM

## 2018-10-11 DIAGNOSIS — R05 Cough: Secondary | ICD-10-CM | POA: Diagnosis not present

## 2018-10-11 MED ORDER — GUAIFENESIN-CODEINE 100-10 MG/5ML PO SYRP: 2.5000 mL | ORAL_SOLUTION | Freq: Every evening | ORAL | 0 refills | Status: DC | PRN

## 2018-10-11 NOTE — Progress Notes (Signed)
Harold Leach is a 78 y.o. male here for a new problem.  I acted as a Education administrator for [employer], PA-C Anselmo Pickler, LPN  History of Present Illness:   Chief Complaint  Patient presents with  . Cough    Cough  This is a new problem. Episode onset: Started a week ago, worse the past 3 nights unable to sleep due to cough. The problem has been gradually worsening. The cough is productive of sputum (expectorating green sputum occassionally.). Associated symptoms include nasal congestion (green nasal drainage) and postnasal drip. Pertinent negatives include no chills, ear pain, fever, headaches, sore throat or shortness of breath. The symptoms are aggravated by lying down. He has tried OTC cough suppressant (Corciden, Mucinex) for the symptoms. The treatment provided mild relief. His past medical history is significant for pneumonia. There is no history of asthma or bronchitis.    Past Medical History:  Diagnosis Date  . 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 Arthralgia of left shoulder region 01/02/2017  . Chronic insomnia 01/02/2017  . 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 Gastroesophageal reflux disease without esophagitis 01/02/2017  . Hyperlipidemia 11/28/2015  . Macular degeneration 01/02/2017  . Prostate cancer [facility])      Social History   Socioeconomic History  . Marital status: Married    Spouse name: Not on file  . Number of children: Not on file  . Years of education: Not on file  . Highest education level: Not on file  Occupational History  . Occupation: Retired  Scientific laboratory technician  . Financial resource strain: Not on file  . Food insecurity:    Worry: Not on file    Inability: Not on file  . Transportation needs:    Medical: Not on file    Non-medical: Not on file  Tobacco Use  . Smoking status: Former Smoker    Types: Pipe    Last attempt to quit: 03/20/1981    Years since quitting: 37.5  . Smokeless tobacco: Never Used  . Tobacco comment: Quit in  1983 pipe smoker   Substance and Sexual Activity  . Alcohol use: No  . Drug use: No  . Sexual activity: Yes    Partners: Female  Lifestyle  . Physical activity:    Days per week: Not on file    Minutes per session: Not on file  . Stress: Not on file  Relationships  . Social connections:    Talks on phone: Not on file    Gets together: Not on file    Attends religious service: Not on file    Active member of club or organization: Not on file    Attends meetings of clubs or organizations: Not on file    Relationship status: Not on file  . Intimate partner violence:    Fear of current or ex partner: Not on file    Emotionally abused: Not on file    Physically abused: Not on file    Forced sexual activity: Not on file  Other Topics Concern  . Not on file  Social History Narrative  . Not on file    Past Surgical History:  Procedure Laterality Date  . HERNIA REPAIR    . PROSTATE SURGERY  2012    Family History  Problem Relation Age of Onset  . Heart disease Mother   . Hypertension Mother   . Prostate cancer Father   . Heart disease Maternal Grandmother   . Stroke Maternal Grandmother  No Known Allergies  Current Medications:   Current Outpatient Medications:  .  Apoaequorin (PREVAGEN) 10 MG CAPS, Take 1 capsule by mouth daily., Disp: , Rfl:  .  aspirin 81 MG tablet, Take 81 mg by mouth daily., Disp: , Rfl:  .  clobetasol cream (TEMOVATE) 6.44 %, Apply 1 application topically 2 (two) times daily., Disp: 30 g, Rfl: 0 .  ferrous sulfate 325 (65 FE) MG tablet, Take 325 mg by mouth daily with breakfast., Disp: , Rfl:  .  FLORA-Q (FLORA-Q) CAPS capsule, Take 1 capsule by mouth daily., Disp: , Rfl:  .  KRILL OIL PO, Take 1 capsule by mouth daily., Disp: , Rfl:  .  magnesium gluconate (MAGONATE) 30 MG tablet, Take 400 mg by mouth daily., Disp: , Rfl:  .  Multiple Vitamins-Minerals (ICAPS AREDS FORMULA PO), Take 2 capsules by mouth daily. , Disp: , Rfl:  .  pantoprazole  (PROTONIX) 40 MG tablet, TAKE 1 TABLET(40 MG) BY MOUTH DAILY, Disp: 90 tablet, Rfl: 3 .  Red Yeast Rice 600 MG CAPS, Take 1,200 mg by mouth daily., Disp: , Rfl:  .  traZODone (DESYREL) 150 MG tablet, TAKE 1 TABLET(150 MG) BY MOUTH AT BEDTIME AS NEEDED FOR SLEEP, Disp: 90 tablet, Rfl: 3 .  guaiFENesin-codeine (ROBITUSSIN AC) 100-10 MG/5ML syrup, Take 2.5 mLs by mouth at bedtime as needed for cough., Disp: 120 mL, Rfl: 0   Review of Systems:   Review of Systems  Constitutional: Negative for chills and fever.  HENT: Positive for postnasal drip. Negative for ear pain and sore throat.   Respiratory: Positive for cough. Negative for shortness of breath.   Neurological: Negative for headaches.    Vitals:   Vitals:   10/11/18 1133  BP: 118/80  Pulse: (!) 57  Temp: 98.5 F (36.9 C)  TempSrc: Oral  SpO2: 95%  Weight: 172 lb 4 oz (78.1 kg)  Height: 5\' 10"  (1.778 m)     Body mass index is 24.72 kg/m.  Physical Exam:   Physical Exam Vitals signs and nursing note reviewed.  Constitutional:      General: He is not in acute distress.    Appearance: He is well-developed. He is not ill-appearing or toxic-appearing.  HENT:     Head: Normocephalic and atraumatic.     Right Ear: Tympanic membrane, ear canal and external ear normal. Tympanic membrane is not erythematous, retracted or bulging.     Left Ear: Tympanic membrane, ear canal and external ear normal. Tympanic membrane is not erythematous, retracted or bulging.     Nose: Mucosal edema, congestion and rhinorrhea present.     Right Sinus: No maxillary sinus tenderness or frontal sinus tenderness.     Left Sinus: No maxillary sinus tenderness or frontal sinus tenderness.     Mouth/Throat:     Pharynx: Uvula midline. Posterior oropharyngeal erythema present.     Tonsils: Swelling: 0 on the right. 0 on the left.  Eyes:     General: Lids are normal.     Conjunctiva/sclera: Conjunctivae normal.  Neck:     Trachea: Trachea normal.   Cardiovascular:     Rate and Rhythm: Normal rate and regular rhythm.     Heart sounds: Normal heart sounds, S1 normal and S2 normal.  Pulmonary:     Effort: Pulmonary effort is normal.     Breath sounds: Normal breath sounds. No decreased breath sounds, wheezing, rhonchi or rales.  Lymphadenopathy:     Cervical: No cervical adenopathy.  Skin:  General: Skin is warm and dry.  Neurological:     Mental Status: He is alert.  Psychiatric:        Speech: Speech normal.        Behavior: Behavior normal. Behavior is cooperative.     Assessment and Plan:   Mahmoud was seen today for cough.  Diagnoses and all orders for this visit:  Cough  Other orders -     guaiFENesin-codeine (ROBITUSSIN AC) 100-10 MG/5ML syrup; Take 2.5 mLs by mouth at bedtime as needed for cough.   No red flags on exam.  Will initiate cheratussin per orders. Discussed drowsiness precautions. Reviewed return precautions including fever, SOB, worsening cough or other concerns. Push fluids and rest. I recommend that patient follow-up if symptoms worsen or persist despite treatment x 7-10 days, sooner if needed.  . Reviewed expectations re: course of current medical issues. . Discussed self-management of symptoms. . Outlined signs and symptoms indicating need for more acute intervention. . Patient verbalized understanding and all questions were answered. . See orders for this visit as documented in the electronic medical record. . Patient received an After-Visit Summary.  CMA or LPN served as scribe during this visit. History, Physical, and Plan performed by medical provider. The above documentation has been reviewed and is accurate and complete.  Inda Coke, PA-C

## 2018-10-11 NOTE — Patient Instructions (Signed)
It was great to see you!  You have a viral upper respiratory infection. Antibiotics are not needed for this.  Viral infections usually take 7-10 days to resolve.  The cough can last a few weeks to go away.  Use medication as prescribed: prescription cough syrup  Push fluids and get plenty of rest. Please return if you are not improving as expected, or if you have high fevers (>101.5) or difficulty swallowing or worsening productive cough.  Call clinic with questions.  I hope you start feeling better soon!

## 2018-12-03 ENCOUNTER — Other Ambulatory Visit: Payer: Self-pay | Admitting: Family Medicine

## 2019-04-13 DIAGNOSIS — N5201 Erectile dysfunction due to arterial insufficiency: Secondary | ICD-10-CM | POA: Diagnosis not present

## 2019-04-13 DIAGNOSIS — Z8546 Personal history of malignant neoplasm of prostate: Secondary | ICD-10-CM | POA: Diagnosis not present

## 2019-04-16 ENCOUNTER — Other Ambulatory Visit: Payer: Self-pay | Admitting: Family Medicine

## 2019-04-26 ENCOUNTER — Other Ambulatory Visit: Payer: Self-pay

## 2019-04-26 ENCOUNTER — Ambulatory Visit (INDEPENDENT_AMBULATORY_CARE_PROVIDER_SITE_OTHER): Payer: Medicare Other | Admitting: Family Medicine

## 2019-04-26 DIAGNOSIS — Z0000 Encounter for general adult medical examination without abnormal findings: Secondary | ICD-10-CM | POA: Diagnosis not present

## 2019-04-26 NOTE — Progress Notes (Signed)
I have reviewed documentation for AWV and Advance Care planning provided by [facility], I agree with documentation, I was immediately available for any questions. Kaylon Hitz, DO   

## 2019-04-26 NOTE — Progress Notes (Signed)
This visit is being conducted via phone call due to the COVID-19 pandemic. This patient has given me verbal consent via phone to conduct this visit, patient states they are participating from their home address. Some vital signs may be absent or patient reported.   Patient identification: identified by name, DOB, and current address.   Subjective:   Harold Leach is a 78 y.o. male who presents for Medicare Annual/Subsequent preventive examination.  Review of Systems:  Cardiac Risk Factors include: advanced age (>86men, >92 women);dyslipidemia     Objective:    Vitals: There were no vitals taken for this visit.  There is no height or weight on file to calculate BMI.  Advanced Directives 04/26/2019 01/21/2018 06/15/2017 01/02/2017 11/13/2015 11/12/2015  Does Patient Have a Medical Advance Directive? Yes Yes Yes Yes Yes No  Type of Paramedic of [place];Living will - Portal;Living will Living will;Healthcare Power of [place];Living will -  Copy of [place] in Chart? No - copy requested - - No - copy requested - -  Would patient like information on creating a medical advance directive? - - - - - No - patient declined information    Tobacco Social History   Tobacco Use  Smoking Status Former Smoker   Types: Pipe   Quit date: 03/20/1981   Years since quitting: 38.1  Smokeless Tobacco Never Used  Tobacco Comment   Quit in 1983 pipe smoker      Counseling given: Not Answered Comment: Quit in 1983 pipe smoker    Clinical Intake:  Pre-visit preparation completed: Yes  Pain : No/denies pain     Nutritional Risks: None Diabetes: No  How often do you need to have someone help you when you read instructions, pamphlets, or other written materials from your doctor or pharmacy?: 1 - Never  Interpreter Needed?: No  Information entered by :: Harold Leach  Past Medical  History:  Diagnosis Date   Acne rosacea 01/02/2017   Arthralgia of left shoulder region 01/02/2017   Chronic insomnia 01/02/2017   Chronic midline low back pain without sciatica 01/02/2017   Gastroesophageal reflux disease without esophagitis 01/02/2017   Hyperlipidemia 11/28/2015   Macular degeneration 01/02/2017   Prostate cancer [facility])    Past Surgical History:  Procedure Laterality Date   [place]  2012   Family History  Problem Relation Age of Onset   Heart disease Mother    Hypertension Mother    Prostate cancer Father    Heart disease Maternal Grandmother    Stroke Maternal Grandmother    Social History   Socioeconomic History   Marital status: Married    Spouse name: Not on file   Number of children: Not on file   Years of education: Not on file   Highest education level: Not on file  Occupational History   Occupation: Retired  Scientist, product/process development strain: Not on file   Food insecurity    Worry: Not on file    Inability: Not on Lexicographer needs    Medical: Not on file    Non-medical: Not on file  Tobacco Use   Smoking status: Former Smoker    Types: Pipe    Quit date: 03/20/1981    Years since quitting: 38.1   Smokeless tobacco: Never Used   Tobacco comment: Quit in 1983 pipe smoker   Substance and Sexual  Activity   Alcohol use: No   Drug use: No   Sexual activity: Yes    Partners: Female  Lifestyle   Physical activity    Days per week: Not on file    Minutes per session: Not on file   Stress: Not on file  Relationships   Social connections    Talks on phone: Not on file    Gets together: Not on file    Attends religious service: Not on file    Active member of club or organization: Not on file    Attends meetings of clubs or organizations: Not on file    Relationship status: Not on file  Other Topics Concern   Not on file  Social History Narrative   Not on file     Outpatient Encounter Medications as of 04/26/2019  Medication Sig   Apoaequorin (PREVAGEN) 10 MG CAPS Take 1 capsule by mouth daily.   aspirin 81 MG tablet Take 81 mg by mouth daily.   clobetasol cream (TEMOVATE) 0.05 % APPLY EXTERNALLY TO THE AFFECTED AREA TWICE DAILY   ferrous sulfate 325 (65 FE) MG tablet Take 325 mg by mouth daily with breakfast.   FLORA-Q (FLORA-Q) CAPS capsule Take 1 capsule by mouth daily.   guaiFENesin-codeine (ROBITUSSIN AC) 100-10 MG/5ML syrup Take 2.5 mLs by mouth at bedtime as needed for cough.   KRILL OIL PO Take 1 capsule by mouth daily.   magnesium gluconate (MAGONATE) 30 MG tablet Take 400 mg by mouth daily.   Multiple Vitamins-Minerals (ICAPS AREDS FORMULA PO) Take 2 capsules by mouth daily.    pantoprazole (PROTONIX) 40 MG tablet TAKE 1 TABLET(40 MG) BY MOUTH DAILY   Red Yeast Rice 600 MG CAPS Take 1,200 mg by mouth daily.   traZODone (DESYREL) 150 MG tablet TAKE 1 TABLET(150 MG) BY MOUTH AT BEDTIME AS NEEDED FOR SLEEP   No facility-administered encounter medications on file as of 04/26/2019.     Activities of Daily Living In your present state of health, do you have any difficulty performing the following activities: 04/26/2019  Hearing? N  Vision? N  Difficulty concentrating or making decisions? N  Walking or climbing stairs? N  Dressing or bathing? N  Doing errands, shopping? N  Preparing Food and eating ? N  Using the Toilet? N  In the past six months, have you accidently leaked urine? N  Do you have problems with loss of bowel control? N  Managing your Medications? N  Managing your Finances? N  Some recent data might be hidden    Patient Care Team: Harold Deutscher, DO as PCP - General (Family Medicine) Harold Bring, MD as Consulting Physician (Urology)   Assessment:   This is a routine wellness examination for Harold Leach.  Exercise Activities and Dietary recommendations Current Exercise Habits: Home exercise routine, Type of  exercise: walking, Time (Minutes): 35, Frequency (Times/Week): 5, Weekly Exercise (Minutes/Week): 175, Intensity: Mild, Exercise limited by: None identified  Goals     Increase water intake      Maintain current health status     Patient Stated     Quit snacking at hs Try some of the protein ice cream. (Halo, Yasso bars and other ice cream with protein in it )        Fall Risk Fall Risk  04/26/2019 01/21/2018 01/02/2017 01/02/2017  Falls in the past year? 0 Yes No Yes  Comment - feet tangled up in shop - -  Number falls in past yr: 0  1 - -  Injury with Fall? 0 - - -  Risk for fall due to : History of fall(s) - - -  Follow up Falls prevention discussed Education provided - -    Depression Screen PHQ 2/9 Scores 04/26/2019 01/21/2018 01/02/2017 01/02/2017  PHQ - 2 Score 0 0 0 0    Cognitive Function MMSE - Mini Mental State Exam 01/02/2017  Orientation to time 5  Orientation to Place 5  Registration 3  Attention/ Calculation 5  Recall 3  Language- name 2 objects 2  Language- repeat 1  Language- follow 3 step command 3  Language- read & follow direction 1  Write a sentence 1  Copy design 1  Total score 30     6CIT Screen 04/26/2019 01/21/2018  What Year? 0 points 0 points  What month? 0 points 0 points  What time? 0 points 0 points  Count back from 20 0 points 0 points  Months in reverse 0 points 0 points  Repeat phrase 0 points 0 points  Total Score 0 0    Immunization History  Administered Date(s) Administered   Influenza Whole 06/29/2017   Influenza, High Dose Seasonal PF 08/21/2016, 07/07/2018   Influenza-Unspecified 08/21/2016   Pneumococcal Conjugate-13 10/10/2015   Tdap 09/12/2016    Qualifies for Shingles Vaccine? Patient aware to check with pharmacy for coverage of Shingrix; discussed with patient variances in coverage due to insurance plans   Screening Tests Health Maintenance  Topic Date Due   INFLUENZA VACCINE  04/23/2019   COLONOSCOPY  09/30/2025     TETANUS/TDAP  09/12/2026   PNA vac Low Risk Adult  Completed   Cancer Screenings: Colorectal: Colonoscopy 10/01/15      Plan:   I have personally reviewed and addressed the Medicare Annual Wellness questionnaire and have noted the following in the patients chart:  A. Medical and social history B. Use of alcohol, tobacco or illicit drugs  C. Current medications and supplements D. Functional ability and status E.  Nutritional status F.  Physical activity G. Advance directives H. List of other physicians I.  Hospitalizations, surgeries, and ER visits in previous 12 months J.  [place] such as hearing and vision if needed, cognitive and depression L. Referrals and appointments-none   In addition, I have reviewed and discussed with patient certain preventive protocols, quality metrics, and best practice recommendations. A written personalized care plan for preventive services as well as general preventive health recommendations were provided to patient.   Signed,  Harold George, Le[place] Nurse Health Advisor   Nurse Notes: Patient would like for provider to be aware that Urologist has ended care and would like for PCP to start ordering PSA next year.

## 2019-04-26 NOTE — Patient Instructions (Signed)
Mr. Harold Leach , Thank you for taking time to come for your Medicare Wellness Visit. I appreciate your ongoing commitment to your health goals. Please review the following plan we discussed and let me know if I can assist you in the future.   Screening recommendations/referrals: Colorectal Screening: completed 10/01/15 with colonoscopy   Vision and Dental Exams: Recommended annual ophthalmology exams for early detection of glaucoma and other disorders of the eye Recommended annual dental exams for proper oral hygiene  Vaccinations: Influenza vaccine: recommended this fall  Pneumococcal vaccine: completed  Tdap vaccine: completed  Shingles vaccine: Please call your insurance company to determine your out of pocket expense for the Shingrix vaccine. You may receive this vaccine at your local pharmacy.  Advanced directives: Please bring a copy of your POA (Power of Attorney) and/or Living Will to your next appointment.  Goals: Recommend to drink at least 6-8 8oz glasses of water per day.   Next appointment: Please schedule your Annual Wellness Visit with your Nurse Health Advisor in one year.  Preventive Care 28 Years and Older, Male Preventive care refers to lifestyle choices and visits with your health care provider that can promote health and wellness. What does preventive care include?  A yearly physical exam. This is also called an annual well check.  Dental exams once or twice a year.  Routine eye exams. Ask your health care provider how often you should have your eyes checked.  Personal lifestyle choices, including:  Daily care of your teeth and gums.  Regular physical activity.  Eating a healthy diet.  Avoiding tobacco and drug use.  Limiting alcohol use.  Practicing safe sex.  Taking low doses of aspirin every day if recommended by your health care provider..  Taking vitamin and mineral supplements as recommended by your health care provider. What happens during an  annual well check? The services and screenings done by your health care provider during your annual well check will depend on your age, overall health, lifestyle risk factors, and family history of disease. Counseling  Your health care provider may ask you questions about your:  Alcohol use.  Tobacco use.  Drug use.  Emotional well-being.  Home and relationship well-being.  Sexual activity.  Eating habits.  History of falls.  Memory and ability to understand (cognition).  Work and work Statistician. Screening  You may have the following tests or measurements:  Height, weight, and BMI.  Blood pressure.  Lipid and cholesterol levels. These may be checked every 5 years, or more frequently if you are over 43 years old.  Skin check.  Lung cancer screening. You may have this screening every year starting at age 54 if you have a 30-pack-year history of smoking and currently smoke or have quit within the past 15 years.  Fecal occult blood test (FOBT) of the stool. You may have this test every year starting at age 69.  Flexible sigmoidoscopy or colonoscopy. You may have a sigmoidoscopy every 5 years or a colonoscopy every 10 years starting at age 17.  Prostate cancer screening. Recommendations will vary depending on your family history and other risks.  Hepatitis C blood test.  Hepatitis B blood test.  Sexually transmitted disease (STD) testing.  Diabetes screening. This is done by checking your blood sugar (glucose) after you have not eaten for a while (fasting). You may have this done every 1-3 years.  Abdominal aortic aneurysm (AAA) screening. You may need this if you are a current or former smoker.  Osteoporosis.  You may be screened starting at age 31 if you are at high risk. Talk with your health care provider about your test results, treatment options, and if necessary, the need for more tests. Vaccines  Your health care provider may recommend certain vaccines,  such as:  Influenza vaccine. This is recommended every year.  Tetanus, diphtheria, and acellular pertussis (Tdap, Td) vaccine. You may need a Td booster every 10 years.  Zoster vaccine. You may need this after age 67.  Pneumococcal 13-valent conjugate (PCV13) vaccine. One dose is recommended after age 66.  Pneumococcal polysaccharide (PPSV23) vaccine. One dose is recommended after age 21. Talk to your health care provider about which screenings and vaccines you need and how often you need them. This information is not intended to replace advice given to you by your health care provider. Make sure you discuss any questions you have with your health care provider. Document Released: 10/05/2015 Document Revised: 05/28/2016 Document Reviewed: 07/10/2015 Elsevier Interactive Patient Education  2017 [employer] Prevention in the Home Falls can cause injuries. They can happen to people of all ages. There are many things you can do to make your home safe and to help prevent falls. What can I do on the outside of my home?  Regularly fix the edges of walkways and driveways and fix any cracks.  Remove anything that might make you trip as you walk through a door, such as a raised step or threshold.  Trim any bushes or trees on the path to your home.  Use bright outdoor lighting.  Clear any walking paths of anything that might make someone trip, such as rocks or tools.  Regularly check to see if handrails are loose or broken. Make sure that both sides of any steps have handrails.  Any raised decks and porches should have guardrails on the edges.  Have any leaves, snow, or ice cleared regularly.  Use sand or salt on walking paths during winter.  Clean up any spills in your garage right away. This includes oil or grease spills. What can I do in the bathroom?  Use night lights.  Install grab bars by the toilet and in the tub and shower. Do not use towel bars as grab bars.  Use  non-skid mats or decals in the tub or shower.  If you need to sit down in the shower, use a plastic, non-slip stool.  Keep the floor dry. Clean up any water that spills on the floor as soon as it happens.  Remove soap buildup in the tub or shower regularly.  Attach bath mats securely with double-sided non-slip rug tape.  Do not have throw rugs and other things on the floor that can make you trip. What can I do in the bedroom?  Use night lights.  Make sure that you have a light by your bed that is easy to reach.  Do not use any sheets or blankets that are too big for your bed. They should not hang down onto the floor.  Have a firm chair that has side arms. You can use this for support while you get dressed.  Do not have throw rugs and other things on the floor that can make you trip. What can I do in the kitchen?  Clean up any spills right away.  Avoid walking on wet floors.  Keep items that you use a lot in easy-to-reach places.  If you need to reach something above you, use a strong step stool  that has a grab bar.  Keep electrical cords out of the way.  Do not use floor polish or wax that makes floors slippery. If you must use wax, use non-skid floor wax.  Do not have throw rugs and other things on the floor that can make you trip. What can I do with my stairs?  Do not leave any items on the stairs.  Make sure that there are handrails on both sides of the stairs and use them. Fix handrails that are broken or loose. Make sure that handrails are as long as the stairways.  Check any carpeting to make sure that it is firmly attached to the stairs. Fix any carpet that is loose or worn.  Avoid having throw rugs at the top or bottom of the stairs. If you do have throw rugs, attach them to the floor with carpet tape.  Make sure that you have a light switch at the top of the stairs and the bottom of the stairs. If you do not have them, ask someone to add them for you. What  else can I do to help prevent falls?  Wear shoes that:  Do not have high heels.  Have rubber bottoms.  Are comfortable and fit you well.  Are closed at the toe. Do not wear sandals.  If you use a stepladder:  Make sure that it is fully opened. Do not climb a closed stepladder.  Make sure that both sides of the stepladder are locked into place.  Ask someone to hold it for you, if possible.  Clearly mark and make sure that you can see:  Any grab bars or handrails.  First and last steps.  Where the edge of each step is.  Use tools that help you move around (mobility aids) if they are needed. These include:  Canes.  Walkers.  Scooters.  Crutches.  Turn on the lights when you go into a dark area. Replace any light bulbs as soon as they burn out.  Set up your furniture so you have a clear path. Avoid moving your furniture around.  If any of your floors are uneven, fix them.  If there are any pets around you, be aware of where they are.  Review your medicines with your doctor. Some medicines can make you feel dizzy. This can increase your chance of falling. Ask your doctor what other things that you can do to help prevent falls. This information is not intended to replace advice given to you by your health care provider. Make sure you discuss any questions you have with your health care provider. Document Released: 07/05/2009 Document Revised: 02/14/2016 Document Reviewed: 10/13/2014 Elsevier Interactive Patient Education  2017 [employer].

## 2019-06-02 DIAGNOSIS — H532 Diplopia: Secondary | ICD-10-CM | POA: Diagnosis not present

## 2019-06-02 DIAGNOSIS — H52203 Unspecified astigmatism, bilateral: Secondary | ICD-10-CM | POA: Diagnosis not present

## 2019-06-02 DIAGNOSIS — H1789 Other corneal scars and opacities: Secondary | ICD-10-CM | POA: Diagnosis not present

## 2019-06-02 DIAGNOSIS — H25813 Combined forms of age-related cataract, bilateral: Secondary | ICD-10-CM | POA: Diagnosis not present

## 2019-06-06 DIAGNOSIS — Z23 Encounter for immunization: Secondary | ICD-10-CM | POA: Diagnosis not present

## 2019-06-13 ENCOUNTER — Other Ambulatory Visit: Payer: Self-pay

## 2019-06-13 DIAGNOSIS — G4700 Insomnia, unspecified: Secondary | ICD-10-CM

## 2019-06-13 DIAGNOSIS — F5104 Psychophysiologic insomnia: Secondary | ICD-10-CM

## 2019-06-13 MED ORDER — TRAZODONE HCL 150 MG PO TABS: ORAL_TABLET | ORAL | 3 refills | Status: DC

## 2019-06-14 DIAGNOSIS — H25811 Combined forms of age-related cataract, right eye: Secondary | ICD-10-CM | POA: Diagnosis not present

## 2019-06-14 DIAGNOSIS — H21561 Pupillary abnormality, right eye: Secondary | ICD-10-CM | POA: Diagnosis not present

## 2019-06-14 DIAGNOSIS — H268 Other specified cataract: Secondary | ICD-10-CM | POA: Diagnosis not present

## 2019-07-06 ENCOUNTER — Other Ambulatory Visit: Payer: Self-pay

## 2019-07-06 DIAGNOSIS — K219 Gastro-esophageal reflux disease without esophagitis: Secondary | ICD-10-CM

## 2019-07-06 MED ORDER — PANTOPRAZOLE SODIUM 40 MG PO TBEC: DELAYED_RELEASE_TABLET | ORAL | 3 refills | Status: DC

## 2019-07-07 NOTE — Progress Notes (Signed)
Subjective:    Harold Leach is a 78 y.o. male who presents today for his Complete Annual Exam.  Harold Leach, CMA acting as scribe for Dr. Briscoe Leach.   HPI: Patient has had increased fatigue and problems sleeping. Has had symptoms for over a month. Concerned with any tick illness. He is sleeping 9hrs or more at night sometimes and still is very sleepy all day. He has lost over 20lbs in the last year. He has had changes in diet that have helped with that. He has been at 145 for about 5 months and is happy with that.    Current Outpatient Medications:  .  Apoaequorin (PREVAGEN) 10 MG CAPS, Take 1 capsule by mouth daily., Disp: , Rfl:  .  aspirin 81 MG tablet, Take 81 mg by mouth daily., Disp: , Rfl:  .  Cholecalciferol (D3-1000 PO), Take by mouth., Disp: , Rfl:  .  CINNAMON PO, Take 1,200 mg by mouth., Disp: , Rfl:  .  ferrous sulfate 325 (65 FE) MG tablet, Take 325 mg by mouth daily with breakfast., Disp: , Rfl:  .  FLORA-Q (FLORA-Q) CAPS capsule, Take 1 capsule by mouth daily., Disp: , Rfl:  .  Gamma-Aminobutyric Acid (GABA IJ), Inject as directed., Disp: , Rfl:  .  Ginkgo Biloba 40 MG TABS, Take by mouth., Disp: , Rfl:  .  KRILL OIL PO, Take 1 capsule by mouth daily., Disp: , Rfl:  .  magnesium gluconate (MAGONATE) 30 MG tablet, Take 400 mg by mouth daily., Disp: , Rfl:  .  Melatonin 10 MG TABS, Take by mouth., Disp: , Rfl:  .  Multiple Vitamins-Minerals (ICAPS AREDS FORMULA PO), Take 2 capsules by mouth daily. , Disp: , Rfl:  .  Red Yeast Rice 600 MG CAPS, Take 1,200 mg by mouth daily., Disp: , Rfl:  .  Turmeric (QC TUMERIC COMPLEX PO), Take 1,000 mg by mouth., Disp: , Rfl:  .  vitamin B-12 (CYANOCOBALAMIN) 500 MCG tablet, Take 500 mcg by mouth daily., Disp: , Rfl:  .  Zinc 50 MG CAPS, Take by mouth., Disp: , Rfl:  .  clobetasol cream (TEMOVATE) 0.05 %, Apply topically 2 (two) times daily., Disp: 30 g, Rfl: 0 .  pantoprazole (PROTONIX) 40 MG tablet, TAKE 1 TABLET(40  MG) BY MOUTH DAILY, Disp: 90 tablet, Rfl: 3 .  traZODone (DESYREL) 150 MG tablet, TAKE 1 TABLET(150 MG) BY MOUTH AT BEDTIME AS NEEDED FOR SLEEP, Disp: 90 tablet, Rfl: 3  There are no preventive care reminders to display for this patient.  PMHx, SurgHx, SocialHx, Medications, and Allergies were reviewed in the Visit Navigator and updated as appropriate.   Past Medical History:  Diagnosis Date  . 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 Arthralgia of left shoulder region 01/02/2017  . Chronic insomnia 01/02/2017  . 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 Gastroesophageal reflux disease without esophagitis 01/02/2017  . Hyperlipidemia 11/28/2015  . Macular degeneration 01/02/2017  . Prostate cancer [facility])      Past Surgical History:  Procedure Laterality Date  . HERNIA REPAIR    . PROSTATE SURGERY  2012     Family History  Problem Relation Age of Onset  . Heart disease Mother   . Hypertension Mother   . Prostate cancer Father   . Heart disease Maternal Grandmother   . Stroke Maternal Grandmother     Social History   Tobacco Use  . Smoking status: Former Smoker    Types: Pipe  Quit date: 03/20/1981    Years since quitting: 38.3  . Smokeless tobacco: Never Used  . Tobacco comment: Quit in 1983 pipe smoker   Substance Use Topics  . Alcohol use: No  . Drug use: No    Review of Systems:   Pertinent items are noted in the HPI. Otherwise, ROS is negative.  Objective:    Vitals:   07/08/19 0857  BP: 110/78  Pulse: (!) 54  Temp: 98.1 F (36.7 C)  SpO2: 95%   Body mass index is 21.24 kg/m.  General  Alert, cooperative, no distress, appears stated age  Head:  Normocephalic, without obvious abnormality, atraumatic  Eyes:  PERRL, conjunctiva/corneas clear, EOM's intact, fundi benign, both eyes       Ears:  Normal TM's and external ear canals, both ears  Nose: Nares normal, septum midline, mucosa normal, no drainage or sinus tenderness  Throat: Lips, mucosa, and  tongue normal; teeth and gums normal  Neck: Supple, symmetrical, trachea midline, no adenopathy; thyroid: no enlargement/tenderness/nodules; no carotid bruit or JVD  Back:   Symmetric, no curvature, ROM normal, no CVA tenderness  Lungs:   Clear to auscultation bilaterally, respirations unlabored  Chest Wall:  No tenderness or deformity  Heart:  Regular rate and rhythm, S1 and S2 normal, no murmur, rub or gallop  Abdomen:   Soft, non-tender, bowel sounds active all four quadrants, no masses, no organomegaly  Extremities: Extremities normal, atraumatic, no cyanosis or edema  Prostate: Not done  Skin: Skin color, texture, turgor normal, no rashes or lesions  Lymph: Cervical, supraclavicular, and axillary nodes normal  Neurologic: CNII-XII grossly intact. Normal strength, sensation and reflexes throughout   AssessmentPlan:   Harold Leach was seen today for follow-up.  Diagnoses and all orders for this visit:  Routine physical examination  Pure hypercholesterolemia -     Comprehensive metabolic panel -     Lipid panel -     CBC with Differential/Platelet -     Comprehensive metabolic panel -     Lipid panel -     Cancel: Magnesium -     Magnesium  Macular degeneration, unspecified laterality, unspecified type  Insomnia, unspecified type Comments: Refill needed today. 90 day Rx requested.  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 burgdorfi  antibodies -     Lyme Disease Abs IgG, IgM, IFA, CSF   Patient Counseling: [x]   Nutrition: Stressed importance of moderation in sodium/caffeine intake, saturated fat and cholesterol, caloric balance, sufficient intake of fresh fruits, vegetables, and fiber  [x]   Stressed the importance of regular exercise.   []   Substance Abuse: Discussed cessation/primary prevention of tobacco, alcohol, or other drug use; driving or other dangerous activities under the influence; availability of treatment for abuse.   [x]   Injury prevention: Discussed  safety belts, safety helmets, smoke detector, smoking near bedding or upholstery.   []   Sexuality: Discussed sexually transmitted diseases, partner selection, use of condoms, avoidance of unintended pregnancy  and contraceptive alternatives.   [x]   Dental health: Discussed importance of regular tooth brushing, flossing, and dental visits.  [x]   Health maintenance and immunizations reviewed. Please refer to Health maintenance section.   Harold Deutscher, DO [place]

## 2019-07-08 ENCOUNTER — Encounter: Payer: Self-pay | Admitting: Family Medicine

## 2019-07-08 ENCOUNTER — Ambulatory Visit (INDEPENDENT_AMBULATORY_CARE_PROVIDER_SITE_OTHER): Payer: Medicare Other | Admitting: Family Medicine

## 2019-07-08 ENCOUNTER — Ambulatory Visit (INDEPENDENT_AMBULATORY_CARE_PROVIDER_SITE_OTHER): Payer: Medicare Other

## 2019-07-08 ENCOUNTER — Other Ambulatory Visit: Payer: Self-pay

## 2019-07-08 VITALS — BP 110/78 | HR 54 | Temp 98.1°F | Ht 70.0 in | Wt 148.0 lb

## 2019-07-08 DIAGNOSIS — R059 Cough, unspecified: Secondary | ICD-10-CM

## 2019-07-08 DIAGNOSIS — W57XXXA Bitten or stung by nonvenomous insect and other nonvenomous arthropods, initial encounter: Secondary | ICD-10-CM | POA: Diagnosis not present

## 2019-07-08 DIAGNOSIS — E559 Vitamin D deficiency, unspecified: Secondary | ICD-10-CM

## 2019-07-08 DIAGNOSIS — R351 Nocturia: Secondary | ICD-10-CM

## 2019-07-08 DIAGNOSIS — E538 Deficiency of other specified B group vitamins: Secondary | ICD-10-CM

## 2019-07-08 DIAGNOSIS — E7800 Pure hypercholesterolemia, unspecified: Secondary | ICD-10-CM | POA: Diagnosis not present

## 2019-07-08 DIAGNOSIS — Z0000 Encounter for general adult medical examination without abnormal findings: Secondary | ICD-10-CM | POA: Diagnosis not present

## 2019-07-08 DIAGNOSIS — R413 Other amnesia: Secondary | ICD-10-CM | POA: Diagnosis not present

## 2019-07-08 DIAGNOSIS — K219 Gastro-esophageal reflux disease without esophagitis: Secondary | ICD-10-CM | POA: Diagnosis not present

## 2019-07-08 DIAGNOSIS — R05 Cough: Secondary | ICD-10-CM | POA: Diagnosis not present

## 2019-07-08 DIAGNOSIS — F5104 Psychophysiologic insomnia: Secondary | ICD-10-CM

## 2019-07-08 DIAGNOSIS — H3530 Unspecified macular degeneration: Secondary | ICD-10-CM | POA: Diagnosis not present

## 2019-07-08 LAB — VITAMIN B12: Vitamin B-12: 563 pg/mL (ref 211–911)

## 2019-07-08 LAB — CBC WITH DIFFERENTIAL/PLATELET
Basophils Absolute: 0.1 10*3/uL (ref 0.0–0.1)
Basophils Relative: 1.5 % (ref 0.0–3.0)
Eosinophils Absolute: 0.1 10*3/uL (ref 0.0–0.7)
Eosinophils Relative: 1.9 % (ref 0.0–5.0)
HCT: 41 % (ref 39.0–52.0)
Hemoglobin: 13.8 g/dL (ref 13.0–17.0)
Lymphocytes Relative: 29.1 % (ref 12.0–46.0)
Lymphs Abs: 1.2 10*3/uL (ref 0.7–4.0)
MCHC: 33.7 g/dL (ref 30.0–36.0)
MCV: 91.8 fl (ref 78.0–100.0)
Monocytes Absolute: 0.4 10*3/uL (ref 0.1–1.0)
Monocytes Relative: 9.4 % (ref 3.0–12.0)
Neutro Abs: 2.5 10*3/uL (ref 1.4–7.7)
Neutrophils Relative %: 58.1 % (ref 43.0–77.0)
Platelets: 151 10*3/uL (ref 150.0–400.0)
RBC: 4.46 Mil/uL (ref 4.22–5.81)
RDW: 13.3 % (ref 11.5–15.5)
WBC: 4.3 10*3/uL (ref 4.0–10.5)

## 2019-07-08 LAB — COMPREHENSIVE METABOLIC PANEL
ALT: 16 U/L (ref 0–53)
AST: 23 U/L (ref 0–37)
Albumin: 4.5 g/dL (ref 3.5–5.2)
Alkaline Phosphatase: 49 U/L (ref 39–117)
BUN: 27 mg/dL — ABNORMAL HIGH (ref 6–23)
CO2: 32 mEq/L (ref 19–32)
Calcium: 9.8 mg/dL (ref 8.4–10.5)
Chloride: 102 mEq/L (ref 96–112)
Creatinine, Ser: 1.36 mg/dL (ref 0.40–1.50)
GFR: 50.96 mL/min — ABNORMAL LOW (ref >60.00)
Glucose, Bld: 87 mg/dL (ref 70–99)
Potassium: 5.2 mEq/L — ABNORMAL HIGH (ref 3.5–5.1)
Sodium: 141 mEq/L (ref 135–145)
Total Bilirubin: 0.5 mg/dL (ref 0.2–1.2)
Total Protein: 6.8 g/dL (ref 6.0–8.3)

## 2019-07-08 LAB — TIQ- MISLABELED: Test Ordered On Req: 6646

## 2019-07-08 LAB — LIPID PANEL
Cholesterol: 170 mg/dL (ref 0–200)
HDL: 61.9 mg/dL (ref >39.00)
LDL Cholesterol: 93 mg/dL (ref 0–99)
NonHDL: 108.18
Total CHOL/HDL Ratio: 3
Triglycerides: 76 mg/dL (ref 0.0–149.0)
VLDL: 15.2 mg/dL (ref 0.0–40.0)

## 2019-07-08 LAB — PSA: PSA: 0 ng/mL — ABNORMAL LOW (ref 0.10–4.00)

## 2019-07-08 LAB — VITAMIN D 25 HYDROXY (VIT D DEFICIENCY, FRACTURES): VITD: 91.41 ng/mL (ref 30.00–100.00)

## 2019-07-08 MED ORDER — TRAZODONE HCL 150 MG PO TABS: ORAL_TABLET | ORAL | 3 refills | Status: DC

## 2019-07-08 MED ORDER — PANTOPRAZOLE SODIUM 40 MG PO TBEC: DELAYED_RELEASE_TABLET | ORAL | 3 refills | Status: DC

## 2019-07-08 MED ORDER — CLOBETASOL PROPIONATE 0.05 % EX CREA: TOPICAL_CREAM | Freq: Two times a day (BID) | CUTANEOUS | 0 refills | Status: DC

## 2019-07-09 ENCOUNTER — Encounter: Payer: Self-pay | Admitting: Family Medicine

## 2019-07-11 ENCOUNTER — Telehealth: Payer: Self-pay | Admitting: Family Medicine

## 2019-07-11 NOTE — Telephone Encounter (Signed)
See note  Copied from [place] [phone]. Topic: General - Other >> Jul 11, 2019 10:03 AM Sheran Luz wrote: Benna Dunks, with [facility], requesting call back from lab to go over a mislabeled specimen.

## 2019-07-11 NOTE — Telephone Encounter (Signed)
Think this if for you

## 2019-07-14 LAB — B. BURGDORFI ANTIBODIES: B burgdorferi Ab IgG+IgM: <0.9 index

## 2019-07-14 LAB — LYME DISEASE ABS IGG, IGM, IFA, CSF

## 2019-07-14 LAB — PAT ID TIQ DOC: Test Affected: 6646

## 2019-07-14 LAB — MAGNESIUM: Magnesium: 2.2 mg/dL (ref 1.5–2.5)

## 2019-07-15 NOTE — Telephone Encounter (Signed)
Pt states that he is returning a call to [place] in the lab

## 2019-07-15 NOTE — Telephone Encounter (Signed)
I spoke with pt  

## 2019-07-15 NOTE — Telephone Encounter (Signed)
See note

## 2019-09-20 NOTE — Progress Notes (Signed)
Harold Leach is a 78 y.o. male is here for Transfer of care.  I acted as a Education administrator for [employer], PA-C Anselmo Pickler, LPN  History of Present Illness:   Chief Complaint  Patient presents with  . Transfer of care    HPI   Insomnia Currently on Trazodone 150 mg. Has been on trazodone for at least 20 years. He was started at 50 mg and has increased gradually over the years. He goes to bed around 11:30p and is now having wake-ups around 2-3a with difficulty falling back asleep. He denies ETOH use or caffeine use. He also started taking 10 mg melatonin. Takes melatonin and trazodone at the same time, at bedtime. He states that he hasn't noticed a significant difference in the melatonin. Denies concerns for anxiety/depression.  There are no preventive care reminders to display for this patient.  Past Medical History:  Diagnosis Date  . 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 Arthralgia of left shoulder region 01/02/2017  . Chronic insomnia 01/02/2017  . 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 Gastroesophageal reflux disease without esophagitis 01/02/2017  . Hyperlipidemia 11/28/2015  . Macular degeneration 01/02/2017  . Prostate cancer [facility])      Social History   Socioeconomic History  . Marital status: Married    Spouse name: Not on file  . Number of children: Not on file  . Years of education: Not on file  . Highest education level: Not on file  Occupational History  . Occupation: Retired  Tobacco Use  . Smoking status: Former Smoker    Types: Pipe    Quit date: 03/20/1981    Years since quitting: 38.5  . Smokeless tobacco: Never Used  . Tobacco comment: Quit in 1983 pipe smoker   Substance and Sexual Activity  . Alcohol use: No  . Drug use: No  . Sexual activity: Yes    Partners: Female  Other Topics Concern  . Not on file  Social History Narrative  . Not on file   Social Determinants of Health   Financial Resource Strain:   . Difficulty of  Paying Living Expenses: Not on file  Food Insecurity:   . Worried About Charity fundraiser in the Last Year: Not on file  . Ran Out of Food in the Last Year: Not on file  Transportation Needs:   . Lack of Transportation (Medical): Not on file  . Lack of Transportation (Non-Medical): Not on file  Physical Activity:   . Days of Exercise per Week: Not on file  . Minutes of Exercise per Session: Not on file  Stress:   . Feeling of Stress : Not on file  Social Connections:   . Frequency of Communication with Friends and Family: Not on file  . Frequency of Social Gatherings with Friends and Family: Not on file  . Attends Religious Services: Not on file  . Active Member of Clubs or Organizations: Not on file  . Attends Archivist Meetings: Not on file  . Marital Status: Not on file  Intimate Partner Violence:   . Fear of Current or Ex-Partner: Not on file  . Emotionally Abused: Not on file  . Physically Abused: Not on file  . Sexually Abused: Not on file    Past Surgical History:  Procedure Laterality Date  . HERNIA REPAIR    . PROSTATE SURGERY  2012    Family History  Problem Relation Age of Onset  . Heart disease Mother   .  Hypertension Mother   . Prostate cancer Father   . Heart disease Maternal Grandmother   . Stroke Maternal Grandmother     PMHx, SurgHx, SocialHx, FamHx, Medications, and Allergies were reviewed in the Visit Navigator and updated as appropriate.   Patient Active Problem List   Diagnosis Date Noted  . 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 ED (erectile dysfunction) 04/14/2018  . Stress incontinence 04/14/2018  . Chronic insomnia 01/02/2017  . Macular degeneration 01/02/2017  . 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 Arthralgia of left shoulder region 01/02/2017  . Gastroesophageal reflux disease without esophagitis 01/02/2017  . Hyperlipidemia 11/28/2015    Social History   Tobacco Use    . Smoking status: Former Smoker    Types: Pipe    Quit date: 03/20/1981    Years since quitting: 38.5  . Smokeless tobacco: Never Used  . Tobacco comment: Quit in 1983 pipe smoker   Substance Use Topics  . Alcohol use: No  . Drug use: No    Current Medications and Allergies:    Current Outpatient Medications:  .  Apoaequorin (PREVAGEN) 10 MG CAPS, Take 1 capsule by mouth daily., Disp: , Rfl:  .  aspirin 81 MG tablet, Take 81 mg by mouth daily., Disp: , Rfl:  .  Cholecalciferol (D3-1000 PO), Take by mouth., Disp: , Rfl:  .  CINNAMON PO, Take 1,200 mg by mouth., Disp: , Rfl:  .  clobetasol cream (TEMOVATE) 0.05 %, Apply topically 2 (two) times daily., Disp: 30 g, Rfl: 0 .  FLORA-Q (FLORA-Q) CAPS capsule, Take 1 capsule by mouth daily., Disp: , Rfl:  .  Gamma-Aminobutyric Acid (GABA IJ), Inject as directed., Disp: , Rfl:  .  Ginkgo Biloba 40 MG TABS, Take by mouth., Disp: , Rfl:  .  KRILL OIL PO, Take 1 capsule by mouth daily., Disp: , Rfl:  .  magnesium gluconate (MAGONATE) 30 MG tablet, Take 400 mg by mouth daily., Disp: , Rfl:  .  Melatonin 10 MG TABS, Take by mouth., Disp: , Rfl:  .  Multiple Vitamins-Minerals (ICAPS AREDS FORMULA PO), Take 2 capsules by mouth daily. , Disp: , Rfl:  .  pantoprazole (PROTONIX) 40 MG tablet, TAKE 1 TABLET(40 MG) BY MOUTH DAILY, Disp: 90 tablet, Rfl: 3 .  Red Yeast Rice 600 MG CAPS, Take 1,200 mg by mouth daily., Disp: , Rfl:  .  traZODone (DESYREL) 150 MG tablet, TAKE 1 TABLET(150 MG) BY MOUTH AT BEDTIME AS NEEDED FOR SLEEP, Disp: 90 tablet, Rfl: 3 .  Turmeric (QC TUMERIC COMPLEX PO), Take 1,000 mg by mouth., Disp: , Rfl:  .  vitamin B-12 (CYANOCOBALAMIN) 500 MCG tablet, Take 500 mcg by mouth daily., Disp: , Rfl:  .  Zinc 50 MG CAPS, Take by mouth., Disp: , Rfl:   No Known Allergies  Review of Systems   ROS Negative unless otherwise specified per HPI.  Vitals:   Vitals:   09/21/19 0813  BP: 126/80  Pulse: (!) 56  Temp: 98.2 F (36.8 C)   TempSrc: Temporal  SpO2: 99%  Weight: 159 lb 4 oz (72.2 kg)  Height: 5\' 10"  (1.778 m)     Body mass index is 22.85 kg/m.   Physical Exam:    Physical Exam Vitals and nursing note reviewed.  Constitutional:      General: He is not in acute distress.    Appearance: He is well-developed. He is not ill-appearing or toxic-appearing.  Cardiovascular:  Rate and Rhythm: Regular rhythm. Bradycardia present.     Pulses: Normal pulses.     Heart sounds: Normal heart sounds, S1 normal and S2 normal.     Comments: No LE edema Pulmonary:     Effort: Pulmonary effort is normal.     Breath sounds: Normal breath sounds.  Skin:    General: Skin is warm and dry.  Neurological:     Mental Status: He is alert.     GCS: GCS eye subscore is 4. GCS verbal subscore is 5. GCS motor subscore is 6.  Psychiatric:        Speech: Speech normal.        Behavior: Behavior normal. Behavior is cooperative.      Assessment and Plan:    Cayd was seen today for transfer of care.  Diagnoses and all orders for this visit:  Insomnia, unspecified type   Chronic. Uncontrolled. Discussed trialing taking Melatonin an hour before his late bedtime to see if that can help. May increase Trazodone to 200 mg if needed, follow-up in 6 months, sooner if concerns.  . Reviewed expectations re: course of current medical issues. . Discussed self-management of symptoms. . Outlined signs and symptoms indicating need for more acute intervention. . Patient verbalized understanding and all questions were answered. . See orders for this visit as documented in the electronic medical record. . Patient received an After Visit Summary.  CMA or LPN served as scribe during this visit. History, Physical, and Plan performed by medical provider. The above documentation has been reviewed and is accurate and complete.  I spent 25 minutes with this patient, greater than 50% was face-to-face time counseling regarding the above  diagnoses.  Inda Coke, PA-C [place], [place] 09/21/2019  Follow-up: No follow-ups on file.

## 2019-09-21 ENCOUNTER — Other Ambulatory Visit: Payer: Self-pay

## 2019-09-21 ENCOUNTER — Encounter: Payer: Self-pay | Admitting: Physician Assistant

## 2019-09-21 ENCOUNTER — Ambulatory Visit (INDEPENDENT_AMBULATORY_CARE_PROVIDER_SITE_OTHER): Payer: Medicare Other | Admitting: Physician Assistant

## 2019-09-21 VITALS — BP 126/80 | HR 56 | Temp 98.2°F | Ht 70.0 in | Wt 159.2 lb

## 2019-09-21 DIAGNOSIS — G4700 Insomnia, unspecified: Secondary | ICD-10-CM | POA: Diagnosis not present

## 2019-09-21 NOTE — Patient Instructions (Signed)
It was great to see you!  Consider taking your melatonin an hour before your bedtime.  Take care,  Inda Coke PA-C

## 2019-10-04 DIAGNOSIS — Z23 Encounter for immunization: Secondary | ICD-10-CM | POA: Diagnosis not present

## 2019-11-04 DIAGNOSIS — Z23 Encounter for immunization: Secondary | ICD-10-CM | POA: Diagnosis not present

## 2019-11-28 NOTE — Progress Notes (Signed)
Harold Leach is a 79 y.o. male here for a follow up of a pre-existing problem.  I acted as a Education administrator for [employer], PA-C Harold Pickler, LPN  History of Present Illness:   Chief Complaint  Patient presents with  . Back Pain    HPI   Back pain Pt c/o low back pain with right sciatica radiating down right leg. Pt injured his back 2 weeks ago lifting firewood and sawing down trees. Pt is seeing a chiropractor but was told to see PCP for steroids. Pt has been on crutches x 1 week. His back pain has significantly improved since seeing the chiropractor. He denies: saddle anesthesia, rectal bleeding, hematuria, loss of bowel/bladder function.   Past Medical History:  Diagnosis Date  . 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 Arthralgia of left shoulder region 01/02/2017  . Chronic insomnia 01/02/2017  . 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 Gastroesophageal reflux disease without esophagitis 01/02/2017  . Hyperlipidemia 11/28/2015  . Macular degeneration 01/02/2017  . Prostate cancer [facility])      Social History   Socioeconomic History  . Marital status: Married    Spouse name: Not on file  . Number of children: Not on file  . Years of education: Not on file  . Highest education level: Not on file  Occupational History  . Occupation: Retired  Tobacco Use  . Smoking status: Former Smoker    Types: Pipe    Quit date: 03/20/1981    Years since quitting: 38.7  . Smokeless tobacco: Never Used  . Tobacco comment: Quit in 1983 pipe smoker   Substance and Sexual Activity  . Alcohol use: No  . Drug use: No  . Sexual activity: Yes    Partners: Female  Other Topics Concern  . Not on file  Social History Narrative  . Not on file   Social Determinants of Health   Financial Resource Strain:   . Difficulty of Paying Living Expenses: Not on file  Food Insecurity:   . Worried About Charity fundraiser in the Last Year: Not on file  . Ran Out of Food in the Last Year:  Not on file  Transportation Needs:   . Lack of Transportation (Medical): Not on file  . Lack of Transportation (Non-Medical): Not on file  Physical Activity:   . Days of Exercise per Week: Not on file  . Minutes of Exercise per Session: Not on file  Stress:   . Feeling of Stress : Not on file  Social Connections:   . Frequency of Communication with Friends and Family: Not on file  . Frequency of Social Gatherings with Friends and Family: Not on file  . Attends Religious Services: Not on file  . Active Member of Clubs or Organizations: Not on file  . Attends Archivist Meetings: Not on file  . Marital Status: Not on file  Intimate Partner Violence:   . Fear of Current or Ex-Partner: Not on file  . Emotionally Abused: Not on file  . Physically Abused: Not on file  . Sexually Abused: Not on file    Past Surgical History:  Procedure Laterality Date  . HERNIA REPAIR    . PROSTATE SURGERY  2012    Family History  Problem Relation Age of Onset  . Heart disease Mother   . Hypertension Mother   . Prostate cancer Father   . Heart disease Maternal Grandmother   . Stroke Maternal Grandmother  No Known Allergies  Current Medications:   Current Outpatient Medications:  .  Apoaequorin (PREVAGEN) 10 MG CAPS, Take 1 capsule by mouth daily., Disp: , Rfl:  .  aspirin 81 MG tablet, Take 81 mg by mouth daily., Disp: , Rfl:  .  Cholecalciferol (D3-1000 PO), Take by mouth., Disp: , Rfl:  .  CINNAMON PO, Take 1,200 mg by mouth., Disp: , Rfl:  .  clobetasol cream (TEMOVATE) 0.05 %, Apply topically 2 (two) times daily., Disp: 30 g, Rfl: 0 .  FLORA-Q (FLORA-Q) CAPS capsule, Take 1 capsule by mouth daily., Disp: , Rfl:  .  Ginkgo Biloba 40 MG TABS, Take by mouth., Disp: , Rfl:  .  KRILL OIL PO, Take 1 capsule by mouth daily., Disp: , Rfl:  .  magnesium gluconate (MAGONATE) 30 MG tablet, Take 400 mg by mouth daily., Disp: , Rfl:  .  Melatonin 10 MG TABS, Take by mouth., Disp: ,  Rfl:  .  Multiple Vitamins-Minerals (ICAPS AREDS FORMULA PO), Take 2 capsules by mouth daily. , Disp: , Rfl:  .  pantoprazole (PROTONIX) 40 MG tablet, TAKE 1 TABLET(40 MG) BY MOUTH DAILY, Disp: 90 tablet, Rfl: 3 .  Red Yeast Rice 600 MG CAPS, Take 1,200 mg by mouth daily., Disp: , Rfl:  .  traZODone (DESYREL) 150 MG tablet, TAKE 1 TABLET(150 MG) BY MOUTH AT BEDTIME AS NEEDED FOR SLEEP, Disp: 90 tablet, Rfl: 3 .  Turmeric (QC TUMERIC COMPLEX PO), Take 1,000 mg by mouth., Disp: , Rfl:  .  vitamin B-12 (CYANOCOBALAMIN) 500 MCG tablet, Take 500 mcg by mouth daily., Disp: , Rfl:  .  Zinc 50 MG CAPS, Take by mouth., Disp: , Rfl:  .  predniSONE (DELTASONE) 20 MG tablet, Take 2 tablets (40 mg total) by mouth daily., Disp: 10 tablet, Rfl: 0   Review of Systems:   ROS Negative unless otherwise specified per HPI.  Vitals:   Vitals:   11/29/19 0931  BP: 140/86  Pulse: (!) 56  Temp: 97.9 F (36.6 C)  TempSrc: Temporal  SpO2: 97%  Weight: 154 lb 8 oz (70.1 kg)  Height: 5\' 10"  (1.778 m)     Body mass index is 22.17 kg/m.  Physical Exam:   Physical Exam Vitals and nursing note reviewed.  Constitutional:      General: He is not in acute distress.    Appearance: He is well-developed. He is not ill-appearing or toxic-appearing.  Cardiovascular:     Rate and Rhythm: Normal rate and regular rhythm.     Pulses: Normal pulses.     Heart sounds: Normal heart sounds, S1 normal and S2 normal.     Comments: No LE edema Pulmonary:     Effort: Pulmonary effort is normal.     Breath sounds: Normal breath sounds.  Musculoskeletal:     Comments: TTP to R SI joint  Skin:    General: Skin is warm and dry.  Neurological:     Mental Status: He is alert.     GCS: GCS eye subscore is 4. GCS verbal subscore is 5. GCS motor subscore is 6.     Comments: Normal sensation to BLE  Psychiatric:        Speech: Speech normal.        Behavior: Behavior normal. Behavior is cooperative.       Assessment  and Plan:   Harold Leach was seen today for back pain.  Diagnoses and all orders for this visit:  Acute right-sided low back  pain with right-sided sciatica  Other orders -     predniSONE (DELTASONE) 20 MG tablet; Take 2 tablets (40 mg total) by mouth daily.   No red flags on exam. Will start oral prednisone -- reviewed potential side effects of medication. Recommend close follow-up if no improvement of symptoms or any worsening. Consider referral to sports medicine if no relief with oral prednisone.  . Reviewed expectations re: course of current medical issues. . Discussed self-management of symptoms. . Outlined signs and symptoms indicating need for more acute intervention. . Patient verbalized understanding and all questions were answered. . See orders for this visit as documented in the electronic medical record. . Patient received an After-Visit Summary.  CMA or LPN served as scribe during this visit. History, Physical, and Plan performed by medical provider. The above documentation has been reviewed and is accurate and complete.   Inda Coke, PA-C

## 2019-11-29 ENCOUNTER — Ambulatory Visit (INDEPENDENT_AMBULATORY_CARE_PROVIDER_SITE_OTHER): Payer: Medicare Other | Admitting: Physician Assistant

## 2019-11-29 ENCOUNTER — Other Ambulatory Visit: Payer: Self-pay

## 2019-11-29 ENCOUNTER — Encounter: Payer: Self-pay | Admitting: Physician Assistant

## 2019-11-29 VITALS — BP 140/86 | HR 56 | Temp 97.9°F | Ht 70.0 in | Wt 154.5 lb

## 2019-11-29 DIAGNOSIS — M5441 Lumbago with sciatica, right side: Secondary | ICD-10-CM

## 2019-11-29 MED ORDER — PREDNISONE 20 MG PO TABS: 40.0000 mg | ORAL_TABLET | Freq: Every day | ORAL | 0 refills | Status: DC

## 2019-11-29 NOTE — Patient Instructions (Signed)
It was great to see you!  Start the oral prednisone -- 20 mg twice a day.  Please continue to rest.  If ongoing back pain after prednisone or any reactions to prednisone, please let us know.  Take care,  Inda Coke PA-C

## 2019-12-05 ENCOUNTER — Telehealth: Payer: Self-pay

## 2019-12-05 NOTE — Telephone Encounter (Signed)
Pt's wife called back, told her I was calling in regards to the refill request for prednisone. Told her [facility] would not refill wants him to see Sports Medicine. Mrs. Leech said she got pt and appt tomorrow with Howard hopefully they will give him and injection in his back. Told her okay good I will let [facility] know and good luck.

## 2019-12-05 NOTE — Telephone Encounter (Signed)
Left message on voicemail to call office.  

## 2019-12-05 NOTE — Telephone Encounter (Signed)
  LAST APPOINTMENT DATE: 11/29/2019   NEXT APPOINTMENT DATE:@6 /30/2021  MEDICATION:predniSONE (DELTASONE) 20 MG tablet  PHARMACY: [facility] [place], [place] - [address] Phone:  [phone]  Fax:  [phone]       **Let patient know to contact pharmacy at the end of the day to make sure medication is ready. **  ** Please notify patient to allow 48-72 hours to process**  **Encourage patient to contact the pharmacy for refills or they can request refills through [facility]**  CLINICAL FILLS OUT ALL BELOW:   LAST REFILL:  QTY:  REFILL DATE:    OTHER COMMENTS:    Okay for refill?  Please advise

## 2019-12-06 DIAGNOSIS — M545 Low back pain: Secondary | ICD-10-CM | POA: Diagnosis not present

## 2019-12-06 DIAGNOSIS — M5416 Radiculopathy, lumbar region: Secondary | ICD-10-CM | POA: Diagnosis not present

## 2019-12-24 DIAGNOSIS — M5136 Other intervertebral disc degeneration, lumbar region: Secondary | ICD-10-CM | POA: Insufficient documentation

## 2019-12-31 DIAGNOSIS — M5136 Other intervertebral disc degeneration, lumbar region: Secondary | ICD-10-CM | POA: Diagnosis not present

## 2020-01-16 DIAGNOSIS — M5416 Radiculopathy, lumbar region: Secondary | ICD-10-CM | POA: Diagnosis not present

## 2020-01-16 DIAGNOSIS — M5136 Other intervertebral disc degeneration, lumbar region: Secondary | ICD-10-CM | POA: Diagnosis not present

## 2020-03-21 ENCOUNTER — Ambulatory Visit: Payer: Medicare Other | Admitting: Physician Assistant

## 2020-03-21 ENCOUNTER — Encounter: Payer: Self-pay | Admitting: *Deleted

## 2020-03-21 ENCOUNTER — Encounter: Payer: Self-pay | Admitting: Physician Assistant

## 2020-03-21 ENCOUNTER — Ambulatory Visit (INDEPENDENT_AMBULATORY_CARE_PROVIDER_SITE_OTHER): Payer: Medicare Other | Admitting: Physician Assistant

## 2020-03-21 ENCOUNTER — Other Ambulatory Visit: Payer: Self-pay

## 2020-03-21 VITALS — BP 130/80 | HR 50 | Temp 98.3°F | Ht 70.0 in | Wt 153.0 lb

## 2020-03-21 DIAGNOSIS — R079 Chest pain, unspecified: Secondary | ICD-10-CM | POA: Diagnosis not present

## 2020-03-21 DIAGNOSIS — R5383 Other fatigue: Secondary | ICD-10-CM | POA: Diagnosis not present

## 2020-03-21 DIAGNOSIS — F5104 Psychophysiologic insomnia: Secondary | ICD-10-CM

## 2020-03-21 DIAGNOSIS — R001 Bradycardia, unspecified: Secondary | ICD-10-CM

## 2020-03-21 LAB — CBC WITH DIFFERENTIAL/PLATELET
Basophils Absolute: 0.1 10*3/uL (ref 0.0–0.1)
Basophils Relative: 1.4 % (ref 0.0–3.0)
Eosinophils Absolute: 0.1 10*3/uL (ref 0.0–0.7)
Eosinophils Relative: 1.7 % (ref 0.0–5.0)
HCT: 38.3 % — ABNORMAL LOW (ref 39.0–52.0)
Hemoglobin: 12.9 g/dL — ABNORMAL LOW (ref 13.0–17.0)
Lymphocytes Relative: 25.8 % (ref 12.0–46.0)
Lymphs Abs: 1.1 10*3/uL (ref 0.7–4.0)
MCHC: 33.5 g/dL (ref 30.0–36.0)
MCV: 87.8 fl (ref 78.0–100.0)
Monocytes Absolute: 0.4 10*3/uL (ref 0.1–1.0)
Monocytes Relative: 9.5 % (ref 3.0–12.0)
Neutro Abs: 2.5 10*3/uL (ref 1.4–7.7)
Neutrophils Relative %: 61.6 % (ref 43.0–77.0)
Platelets: 143 10*3/uL — ABNORMAL LOW (ref 150.0–400.0)
RBC: 4.37 Mil/uL (ref 4.22–5.81)
RDW: 15.4 % (ref 11.5–15.5)
WBC: 4.1 10*3/uL (ref 4.0–10.5)

## 2020-03-21 LAB — COMPREHENSIVE METABOLIC PANEL
ALT: 14 U/L (ref 0–53)
AST: 20 U/L (ref 0–37)
Albumin: 4.4 g/dL (ref 3.5–5.2)
Alkaline Phosphatase: 46 U/L (ref 39–117)
BUN: 26 mg/dL — ABNORMAL HIGH (ref 6–23)
CO2: 33 mEq/L — ABNORMAL HIGH (ref 19–32)
Calcium: 9.6 mg/dL (ref 8.4–10.5)
Chloride: 104 mEq/L (ref 96–112)
Creatinine, Ser: 1.65 mg/dL — ABNORMAL HIGH (ref 0.40–1.50)
GFR: 40.69 mL/min — ABNORMAL LOW (ref >60.00)
Glucose, Bld: 93 mg/dL (ref 70–99)
Potassium: 4.8 mEq/L (ref 3.5–5.1)
Sodium: 142 mEq/L (ref 135–145)
Total Bilirubin: 0.4 mg/dL (ref 0.2–1.2)
Total Protein: 6.4 g/dL (ref 6.0–8.3)

## 2020-03-21 LAB — TSH: TSH: 2.13 u[IU]/mL (ref 0.35–4.50)

## 2020-03-21 NOTE — Patient Instructions (Signed)
It was great to see you!  We will send you to cardiology for further evaluation just to make sure everything looks great.  I will update your labs today.  Please consider taking a statin to help lower your chance of having a stroke or heart attack.  Let's follow-up in 4 months for a physical and labwork, sooner if you have concerns.  Take care,  Inda Coke PA-C

## 2020-03-21 NOTE — Progress Notes (Deleted)
Harold Leach is a 79 y.o. male is here to discuss:  I acted as a Education administrator for [employer], PA-C Anselmo Pickler, LPN   History of Present Illness:   No chief complaint on file.   HPI  Health Maintenance Due  Topic Date Due  . COVID-19 Vaccine (1) Never done    Past Medical History:  Diagnosis Date  . 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 Arthralgia of left shoulder region 01/02/2017  . Chronic insomnia 01/02/2017  . 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 Gastroesophageal reflux disease without esophagitis 01/02/2017  . Hyperlipidemia 11/28/2015  . Macular degeneration 01/02/2017  . Prostate cancer [facility])      Social History   Tobacco Use  . Smoking status: Former Smoker    Types: Pipe    Quit date: 03/20/1981    Years since quitting: 39.0  . Smokeless tobacco: Never Used  . Tobacco comment: Quit in 1983 pipe smoker   Substance Use Topics  . Alcohol use: No  . Drug use: No    Past Surgical History:  Procedure Laterality Date  . HERNIA REPAIR    . PROSTATE SURGERY  2012    Family History  Problem Relation Age of Onset  . Heart disease Mother   . Hypertension Mother   . Prostate cancer Father   . Heart disease Maternal Grandmother   . Stroke Maternal Grandmother     PMHx, SurgHx, SocialHx, FamHx, Medications, and Allergies were reviewed in the Visit Navigator and updated as appropriate.   Patient Active Problem List   Diagnosis Date Noted  . 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 ED (erectile dysfunction) 04/14/2018  . Stress incontinence 04/14/2018  . Chronic insomnia 01/02/2017  . Macular degeneration 01/02/2017  . 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 Arthralgia of left shoulder region 01/02/2017  . Gastroesophageal reflux disease without esophagitis 01/02/2017  . Hyperlipidemia 11/28/2015    Social History   Tobacco Use  . Smoking status: Former Smoker    Types: Pipe     Quit date: 03/20/1981    Years since quitting: 39.0  . Smokeless tobacco: Never Used  . Tobacco comment: Quit in 1983 pipe smoker   Substance Use Topics  . Alcohol use: No  . Drug use: No    Current Medications and Allergies:    Current Outpatient Medications:  .  Apoaequorin (PREVAGEN) 10 MG CAPS, Take 1 capsule by mouth daily., Disp: , Rfl:  .  aspirin 81 MG tablet, Take 81 mg by mouth daily., Disp: , Rfl:  .  Cholecalciferol (D3-1000 PO), Take by mouth., Disp: , Rfl:  .  CINNAMON PO, Take 1,200 mg by mouth., Disp: , Rfl:  .  clobetasol cream (TEMOVATE) 0.05 %, Apply topically 2 (two) times daily., Disp: 30 g, Rfl: 0 .  FLORA-Q (FLORA-Q) CAPS capsule, Take 1 capsule by mouth daily., Disp: , Rfl:  .  Ginkgo Biloba 40 MG TABS, Take by mouth., Disp: , Rfl:  .  KRILL OIL PO, Take 1 capsule by mouth daily., Disp: , Rfl:  .  magnesium gluconate (MAGONATE) 30 MG tablet, Take 400 mg by mouth daily., Disp: , Rfl:  .  Melatonin 10 MG TABS, Take by mouth., Disp: , Rfl:  .  Multiple Vitamins-Minerals (ICAPS AREDS FORMULA PO), Take 2 capsules by mouth daily. , Disp: , Rfl:  .  pantoprazole (PROTONIX) 40 MG tablet, TAKE 1 TABLET(40 MG) BY MOUTH DAILY,  Disp: 90 tablet, Rfl: 3 .  predniSONE (DELTASONE) 20 MG tablet, Take 2 tablets (40 mg total) by mouth daily., Disp: 10 tablet, Rfl: 0 .  Red Yeast Rice 600 MG CAPS, Take 1,200 mg by mouth daily., Disp: , Rfl:  .  traZODone (DESYREL) 150 MG tablet, TAKE 1 TABLET(150 MG) BY MOUTH AT BEDTIME AS NEEDED FOR SLEEP, Disp: 90 tablet, Rfl: 3 .  Turmeric (QC TUMERIC COMPLEX PO), Take 1,000 mg by mouth., Disp: , Rfl:  .  vitamin B-12 (CYANOCOBALAMIN) 500 MCG tablet, Take 500 mcg by mouth daily., Disp: , Rfl:  .  Zinc 50 MG CAPS, Take by mouth., Disp: , Rfl:   No Known Allergies  Review of Systems   ROS  Vitals:  There were no vitals filed for this visit.   There is no height or weight on file to calculate BMI.   Physical Exam:    Physical Exam     Assessment and Plan:    There are no diagnoses linked to this encounter.  . Reviewed expectations re: course of current medical issues. . Discussed self-management of symptoms. . Outlined signs and symptoms indicating need for more acute intervention. . Patient verbalized understanding and all questions were answered. . See orders for this visit as documented in the electronic medical record. . Patient received an After Visit Summary.  ***  Inda Coke, PA-C [place], [place] 03/21/2020  Follow-up: No follow-ups on file.

## 2020-03-21 NOTE — Progress Notes (Signed)
Harold Leach is a 79 y.o. male is here for follow up.  I acted as a Education administrator for [employer], PA-C Anselmo Pickler, LPN   History of Present Illness:   Chief Complaint  Patient presents with  . Insomnia  . Chest Pain    HPI   Insomnia Pt says sleeping pretty good, getting 6-7 hours a night. Currently taking Trazodone 150 mg and Melatonin 10 mg. Does wake up around 2-3a to use the bathrrom but typically is able to fall back asleep.   Rib pain Pt c/o left side rib pain off and on x 30 years. Sees Chiropractor for his back and sometimes he adjusts him and put his rib back in place. Last saw cardiology about 20 years ago.  About once a year he has to get his rib popped back into place. He only has pain when he moves a certain way; denies pain with aerobic activity. Denies SOB, n/v.   Fatigue He states that about 2-3 months ago he had a 2 week period of fatigue. Not sure if it was possibly related to tick bites. He states that he has a lot of ticks at his home due to his land and his dog. Denies any rashes, joint pain, unresolved fatigue. He is diligent with checking for ticks regularly.    The 10-year ASCVD risk score Mikey Bussing DC Brooke Bonito., et al., 2013) is: 24.2%   Values used to calculate the score:     Age: 86 years     Sex: Male     Is Non-Hispanic African American: No     Diabetic: No     Tobacco smoker: No     Systolic Blood Pressure: 242 mmHg     Is BP treated: No     HDL Cholesterol: 61.9 mg/dL     Total Cholesterol: 170 mg/dL    Health Maintenance Due  Topic Date Due  . COVID-19 Vaccine (1) Never done    Past Medical History:  Diagnosis Date  . 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 Arthralgia of left shoulder region 01/02/2017  . Chronic insomnia 01/02/2017  . 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 Gastroesophageal reflux disease without esophagitis 01/02/2017  . Hyperlipidemia 11/28/2015  . Macular degeneration 01/02/2017  . Prostate cancer [facility])        Social History   Tobacco Use  . Smoking status: Former Smoker    Types: Pipe    Quit date: 03/20/1981    Years since quitting: 39.0  . Smokeless tobacco: Never Used  . Tobacco comment: Quit in 1983 pipe smoker   Substance Use Topics  . Alcohol use: No  . Drug use: No    Past Surgical History:  Procedure Laterality Date  . HERNIA REPAIR    . PROSTATE SURGERY  2012    Family History  Problem Relation Age of Onset  . Heart disease Mother   . Hypertension Mother   . Prostate cancer Father   . Heart disease Maternal Grandmother   . Stroke Maternal Grandmother     PMHx, SurgHx, SocialHx, FamHx, Medications, and Allergies were reviewed in the Visit Navigator and updated as appropriate.   Patient Active Problem List   Diagnosis Date Noted  . 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 ED (erectile dysfunction) 04/14/2018  . Stress incontinence 04/14/2018  . Chronic insomnia 01/02/2017  . Macular degeneration 01/02/2017  . 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 Arthralgia  of left shoulder region 01/02/2017  . Gastroesophageal reflux disease without esophagitis 01/02/2017  . Hyperlipidemia 11/28/2015    Social History   Tobacco Use  . Smoking status: Former Smoker    Types: Pipe    Quit date: 03/20/1981    Years since quitting: 39.0  . Smokeless tobacco: Never Used  . Tobacco comment: Quit in 1983 pipe smoker   Substance Use Topics  . Alcohol use: No  . Drug use: No    Current Medications and Allergies:    Current Outpatient Medications:  .  Apoaequorin (PREVAGEN) 10 MG CAPS, Take 1 capsule by mouth daily., Disp: , Rfl:  .  aspirin 81 MG tablet, Take 81 mg by mouth daily., Disp: , Rfl:  .  Cholecalciferol (D3-1000 PO), Take by mouth., Disp: , Rfl:  .  CINNAMON PO, Take 1,200 mg by mouth., Disp: , Rfl:  .  clobetasol cream (TEMOVATE) 0.05 %, Apply topically 2 (two) times daily., Disp: 30 g, Rfl: 0 .  FLORA-Q  (FLORA-Q) CAPS capsule, Take 1 capsule by mouth daily., Disp: , Rfl:  .  Ginkgo Biloba 40 MG TABS, Take by mouth., Disp: , Rfl:  .  KRILL OIL PO, Take 1 capsule by mouth daily., Disp: , Rfl:  .  magnesium gluconate (MAGONATE) 30 MG tablet, Take 400 mg by mouth daily., Disp: , Rfl:  .  Melatonin 10 MG TABS, Take by mouth., Disp: , Rfl:  .  Multiple Vitamins-Minerals (ICAPS AREDS FORMULA PO), Take 2 capsules by mouth daily. , Disp: , Rfl:  .  OVER THE COUNTER MEDICATION, Take 750 mg by mouth daily. GABA, Disp: , Rfl:  .  pantoprazole (PROTONIX) 40 MG tablet, TAKE 1 TABLET(40 MG) BY MOUTH DAILY, Disp: 90 tablet, Rfl: 3 .  Red Yeast Rice 600 MG CAPS, Take 1,200 mg by mouth daily., Disp: , Rfl:  .  traZODone (DESYREL) 150 MG tablet, TAKE 1 TABLET(150 MG) BY MOUTH AT BEDTIME AS NEEDED FOR SLEEP, Disp: 90 tablet, Rfl: 3 .  Turmeric (QC TUMERIC COMPLEX PO), Take 1,000 mg by mouth., Disp: , Rfl:  .  vitamin B-12 (CYANOCOBALAMIN) 500 MCG tablet, Take 500 mcg by mouth daily., Disp: , Rfl:  .  Zinc 50 MG CAPS, Take by mouth., Disp: , Rfl:   No Known Allergies  Review of Systems   ROS  Negative unless otherwise specified per HPI.  Vitals:   Vitals:   03/21/20 0803  BP: 130/80  Pulse: (!) 50  Temp: 98.3 F (36.8 C)  TempSrc: Temporal  SpO2: 97%  Weight: 153 lb (69.4 kg)  Height: 5\' 10"  (1.778 m)     Body mass index is 21.95 kg/m.   Physical Exam:    Physical Exam Vitals and nursing note reviewed.  Constitutional:      General: He is not in acute distress.    Appearance: He is well-developed. He is not ill-appearing or toxic-appearing.  Cardiovascular:     Rate and Rhythm: Regular rhythm. Bradycardia present.     Pulses: Normal pulses.     Heart sounds: Normal heart sounds, S1 normal and S2 normal.     Comments: No LE edema Pulmonary:     Effort: Pulmonary effort is normal.     Breath sounds: Normal breath sounds.  Skin:    General: Skin is warm and dry.  Neurological:      Mental Status: He is alert.     GCS: GCS eye subscore is 4. GCS verbal subscore is 5. GCS  motor subscore is 6.  Psychiatric:        Speech: Speech normal.        Behavior: Behavior normal. Behavior is cooperative.      Assessment and Plan:    Kasyn was seen today for insomnia and chest pain.  Diagnoses and all orders for this visit:  Chest pain, unspecified type; Bradycardia EKG tracing is personally reviewed.  EKG notes NSR.  No acute changes. ASCVD is 24%. He currently takes 81 mg ASA daily. Has never taken a statin -- we discussed starting this and he would like to defer to cardiology. Will put in referral for further evaluation and management given it has been > 20 years since last evaluation. Worsening precautions advised in the meantime. -     EKG 12-Lead -     TSH -     CBC with Differential/Platelet -     Comprehensive metabolic panel -     Ambulatory referral to Cardiology  Fatigue, unspecified type Will check for tick-borne illness with labs per patient request. Patient states that his symptoms are completely resolved. Provided handout regarding prevention of tick-borne illness. -     Lyme Ab (IgG/M) + RMSF( IgG/M) -     B. burgdorfi antibodies by WB -     TSH  Chronic insomnia Currently well controlled with Trazodone 150 mg daily and Melatonin 10 mg daily. Follow-up in 4 months for CPE, sooner if concerns.  . Reviewed expectations re: course of current medical issues. . Discussed self-management of symptoms. . Outlined signs and symptoms indicating need for more acute intervention. . Patient verbalized understanding and all questions were answered. . See orders for this visit as documented in the electronic medical record. . Patient received an After Visit Summary.  CMA or LPN served as scribe during this visit. History, Physical, and Plan performed by medical provider. The above documentation has been reviewed and is accurate and complete.  Inda Coke,  PA-C [place] [place] 03/21/2020  Follow-up: No follow-ups on file.

## 2020-03-23 ENCOUNTER — Encounter: Payer: Self-pay | Admitting: Physician Assistant

## 2020-03-23 LAB — RMSF, IGG, IFA: RMSF, IGG, IFA: 1:128 {titer} — ABNORMAL HIGH

## 2020-03-23 LAB — B. BURGDORFI ANTIBODIES BY WB

## 2020-03-23 LAB — LYMEAB(IGG/M)+RMSF(IGG/M)
LYME DISEASE AB, QUANT, IGM: <0.8 index (ref 0.00–0.79)
Lyme IgG/IgM Ab: <0.91 {ISR} (ref 0.00–0.90)
RMSF IgG: POSITIVE — AB
RMSF IgM: 1.41 index — ABNORMAL HIGH (ref 0.00–0.89)

## 2020-03-27 ENCOUNTER — Other Ambulatory Visit: Payer: Self-pay | Admitting: Physician Assistant

## 2020-03-27 DIAGNOSIS — R072 Precordial pain: Secondary | ICD-10-CM | POA: Insufficient documentation

## 2020-03-27 DIAGNOSIS — Z7189 Other specified counseling: Secondary | ICD-10-CM | POA: Insufficient documentation

## 2020-03-27 MED ORDER — DOXYCYCLINE HYCLATE 100 MG PO TABS
100.0000 mg | ORAL_TABLET | Freq: Two times a day (BID) | ORAL | 0 refills | Status: DC
Start: 2020-03-27 — End: 2020-07-23

## 2020-03-27 NOTE — Progress Notes (Signed)
Cardiology Office Note   Date:  03/28/2020   ID:  Harold Leach, Harold Leach Feb 02, 1941, MRN [id number]  PCP:  Inda Coke, Pablo  Cardiologist:   No primary care provider on file. Referring:  Inda Coke, PA  Chief Complaint  Patient presents with  . Chest Pain      History of Present Illness: Harold Leach is a 79 y.o. male who is referred by Inda Coke, PA for evaluation of chest pain.  The patient describes a couple of types of discomfort.  1 of these is pain when his "rib pops out."  He goes to the chiropractor and gets it fixed.  However, he is having another discomfort when he turns in a certain direction to for instance look for a tic on his back.  It feels sharp discomfort in his left mid chest.  This is a shooting pain.  It comes and goes quickly.  He does not describe associated symptoms such as nausea vomiting or diaphoresis because the pain is too quick.  He cannot bring this on with other activities and he does stay active.  He does gardening.  He does not describe associated shortness of breath, PND or orthopnea.  Has not had palpitations, presyncope or syncope.  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  Past Medical History:  Diagnosis Date  . 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 Arthralgia of left shoulder region 01/02/2017  . Chronic insomnia 01/02/2017  . 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 Gastroesophageal reflux disease without esophagitis 01/02/2017  . Hyperlipidemia 11/28/2015  . Macular degeneration 01/02/2017  . Prostate cancer [facility])     Past Surgical History:  Procedure Laterality Date  . HERNIA REPAIR    . PROSTATE SURGERY  2012     Current Outpatient Medications  Medication Sig Dispense Refill  . Apoaequorin (PREVAGEN) 10 MG CAPS Take 1 capsule by mouth daily.    Marland Kitchen aspirin 81 MG tablet Take 81 mg by mouth daily.    . Cholecalciferol (D3-1000 PO) Take by mouth.    Marland Kitchen CINNAMON PO Take 1,200  mg by mouth.    . clobetasol cream (TEMOVATE) 0.05 % Apply topically 2 (two) times daily. 30 g 0  . doxycycline (VIBRA-TABS) 100 MG tablet Take 1 tablet (100 mg total) by mouth 2 (two) times daily. 20 tablet 0  . FLORA-Q (FLORA-Q) CAPS capsule Take 1 capsule by mouth daily.    . Ginkgo Biloba (GINKOBA PO) 60 mg. Take two tablets twice daily    . Ginkgo Biloba 40 MG TABS Take by mouth.    Marland Kitchen KRILL OIL PO Take 1 capsule by mouth daily.    . magnesium gluconate (MAGONATE) 30 MG tablet Take 400 mg by mouth daily.    . Melatonin 10 MG TABS Take by mouth.    . Multiple Vitamins-Minerals (ICAPS AREDS FORMULA PO) Take 2 capsules by mouth daily.     Marland Kitchen OVER THE COUNTER MEDICATION Take 750 mg by mouth daily. GABA    . pantoprazole (PROTONIX) 40 MG tablet TAKE 1 TABLET(40 MG) BY MOUTH DAILY 90 tablet 3  . Red Yeast Rice 600 MG CAPS Take 600 mg by mouth daily.     . traZODone (DESYREL) 150 MG tablet TAKE 1 TABLET(150 MG) BY MOUTH AT BEDTIME AS NEEDED FOR SLEEP 90 tablet 3  . Turmeric (QC TUMERIC COMPLEX PO) Take 1,000 mg by mouth.    . vitamin B-12 (CYANOCOBALAMIN) 500 MCG  tablet Take 1,000 mcg by mouth daily.     . Zinc 50 MG CAPS Take by mouth.     No current facility-administered medications for this visit.    Allergies:   Patient has no known allergies.    Social History:  The patient  reports that he quit smoking about 39 years ago. His smoking use included pipe. He has never used smokeless tobacco. He reports that he does not drink alcohol and does not use drugs.   Family History:  The patient's family history includes Heart disease in his maternal grandmother and mother; Hypertension in his mother; Prostate cancer in his father; Stroke in his maternal grandmother.    ROS:  Please see the history of present illness.   Otherwise, review of systems are positive for none.   All other systems are reviewed and negative.    PHYSICAL EXAM: VS:  BP 120/70   Pulse (!) 56   Ht 5\' 10"  (1.778 m)   Wt  152 lb (68.9 kg)   BMI 21.81 kg/m  , BMI Body mass index is 21.81 kg/m. GENERAL:  Well appearing HEENT:  Pupils equal round and reactive, fundi not visualized, oral mucosa unremarkable NECK:  No jugular venous distention, waveform within normal limits, carotid upstroke brisk and symmetric, no bruits, no thyromegaly LYMPHATICS:  No cervical, inguinal adenopathy LUNGS:  Clear to auscultation bilaterally BACK:  No CVA tenderness CHEST:  Unremarkable HEART:  PMI not displaced or sustained,S1 and S2 within normal limits, no S3, no S4, no clicks, no rubs, soft brief apical systolic murmur slight radiation to the right upper sternal border, early peaking, no change with Valsalva no diastolic murmurs ABD:  Flat, positive bowel sounds normal in frequency in pitch, no bruits, no rebound, no guarding, no midline pulsatile mass, no hepatomegaly, no splenomegaly EXT:  2 plus pulses throughout, no edema, no cyanosis no clubbing SKIN:  No rashes no nodules NEURO:  Cranial nerves II through XII grossly intact, motor grossly intact throughout PSYCH:  Cognitively intact, oriented to person place and time    EKG:  EKG is not ordered today. The ekg ordered today demonstrates sinus bradycardia, rate 47, axis within normal limits, intervals within normal limits, no acute ST-T wave changes.   Recent Labs: 07/08/2019: Magnesium 2.2 03/21/2020: ALT 14; BUN 26; Creatinine, Ser 1.65; Hemoglobin 12.9; Platelets 143.0; Potassium 4.8; Sodium 142; TSH 2.13    Lipid Panel    Component Value Date/Time   CHOL 170 07/08/2019 1002   TRIG 76.0 07/08/2019 1002   HDL 61.90 07/08/2019 1002   CHOLHDL 3 07/08/2019 1002   VLDL 15.2 07/08/2019 1002   LDLCALC 93 07/08/2019 1002      Wt Readings from Last 3 Encounters:  03/28/20 152 lb (68.9 kg)  03/21/20 153 lb (69.4 kg)  11/29/19 154 lb 8 oz (70.1 kg)      Other studies Reviewed: Additional studies/ records that were reviewed today include: Labs. Review of the  above records demonstrates:  Please see elsewhere in the note.  NA   ASSESSMENT AND PLAN:  PRECORDIAL CHEST PAIN:    His chest pain is somewhat atypical.  However, there are some cardiovascular risk factors.  I think the pretest probability of obstructive coronary disease is low. I will bring the patient back for a POET (Plain Old Exercise Test). This will allow me to screen for obstructive coronary disease, risk stratify and very importantly provide a prescription for exercise.  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  No change in therapy.  BRADYCARDIA: He says he has had a slow heart rhythm for years.  He is not bothered by this.  He has no associated symptoms.  We will judge his chronotropic competence at the time of his treadmill.  DYSLIPIDEMIA: His LDL was 93 but his HDL was 61.  No change in therapy.  COVID EDUCATION: He has had his vaccine.  Current medicines are reviewed at length with the patient today.  The patient does not have concerns regarding medicines.  The following changes have been made:  no change  Labs/ tests ordered today include:   Orders Placed This Encounter  Procedures  . EXERCISE TOLERANCE TEST (ETT)     Disposition:   FU with me as needed.     Signed, Minus Breeding, MD  03/28/2020 1:27 PM    [facility]

## 2020-03-28 ENCOUNTER — Encounter: Payer: Self-pay | Admitting: Cardiology

## 2020-03-28 ENCOUNTER — Other Ambulatory Visit: Payer: Self-pay

## 2020-03-28 ENCOUNTER — Ambulatory Visit (INDEPENDENT_AMBULATORY_CARE_PROVIDER_SITE_OTHER): Payer: Medicare Other | Admitting: Cardiology

## 2020-03-28 VITALS — BP 120/70 | HR 56 | Ht 70.0 in | Wt 152.0 lb

## 2020-03-28 DIAGNOSIS — R072 Precordial pain: Secondary | ICD-10-CM | POA: Diagnosis not present

## 2020-03-28 DIAGNOSIS — E785 Hyperlipidemia, unspecified: Secondary | ICD-10-CM

## 2020-03-28 DIAGNOSIS — Z7189 Other specified counseling: Secondary | ICD-10-CM

## 2020-03-28 NOTE — Patient Instructions (Signed)
Medication Instructions:  The current medical regimen is effective;  continue present plan and medications.  *If you need a refill on your cardiac medications before your next appointment, please call your pharmacy*  Testing/Procedures: Your physician has requested that you have an exercise tolerance test. This will be completed at [address], 3rd floor, [place], [place].  Nothing to eat/drink and no tobacco 4 hours before.  Wear 2 piece clothing and comfortable walking shoes.  Follow-Up: At [facility], you and your health needs are our priority.  As part of our continuing mission to provide you with exceptional heart care, we have created designated Provider Care Teams.  These Care Teams include your primary Cardiologist (physician) and Advanced Practice Providers (APPs -  Physician Assistants and Nurse Practitioners) who all work together to provide you with the care you need, when you need it.  We recommend signing up for the patient portal called "MyChart".  Sign up information is provided on this After Visit Summary.  MyChart is used to connect with patients for Virtual Visits (Telemedicine).  Patients are able to view lab/test results, encounter notes, upcoming appointments, etc.  Non-urgent messages can be sent to your provider as well.   To learn more about what you can do with MyChart, go to [email or web address].    Follow up will be determined after the above testing.  Thank you for choosing [facility]!!

## 2020-04-04 ENCOUNTER — Other Ambulatory Visit: Payer: Self-pay

## 2020-04-04 ENCOUNTER — Ambulatory Visit (INDEPENDENT_AMBULATORY_CARE_PROVIDER_SITE_OTHER): Payer: Medicare Other | Admitting: Physician Assistant

## 2020-04-04 ENCOUNTER — Encounter: Payer: Self-pay | Admitting: Physician Assistant

## 2020-04-04 VITALS — BP 110/70 | HR 58 | Temp 98.5°F | Ht 70.0 in | Wt 153.0 lb

## 2020-04-04 DIAGNOSIS — M5441 Lumbago with sciatica, right side: Secondary | ICD-10-CM | POA: Diagnosis not present

## 2020-04-04 DIAGNOSIS — N289 Disorder of kidney and ureter, unspecified: Secondary | ICD-10-CM | POA: Diagnosis not present

## 2020-04-04 MED ORDER — PREDNISONE 20 MG PO TABS: 40.0000 mg | ORAL_TABLET | Freq: Every day | ORAL | 0 refills | Status: DC

## 2020-04-04 MED ORDER — METHYLPREDNISOLONE ACETATE 80 MG/ML IJ SUSP
80.0000 mg | Freq: Once | INTRAMUSCULAR | Status: AC
  Administered 2020-04-04: 80 mg via INTRAMUSCULAR

## 2020-04-04 NOTE — Progress Notes (Signed)
Harold Leach is a 79 y.o. male here for a follow up of a pre-existing problem.  I acted as a Education administrator for [employer], PA-C Anselmo Pickler, LPN   History of Present Illness:   Chief Complaint  Patient presents with  . Back Pain    HPI   Back pain Pt c/o right back pain and radiating down right leg, sciatica pain. Pt was lifting too much on Friday and then pain started. Pt saw Chiropractor on Monday and Tuesday with slight relief. Pt is taking Aleve for pain. Denies: lower leg weakness, incontinence of bowel/bladder, saddle anesthesia, falls.  Had similar pain in March 2021 treated with prednisone.  Decreased GFR Patient with recent decrease of GFR on lab draw. Here for recheck. Does take occasional aleve.  Past Medical History:  Diagnosis Date  . 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 Arthralgia of left shoulder region 01/02/2017  . Chronic insomnia 01/02/2017  . 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 Gastroesophageal reflux disease without esophagitis 01/02/2017  . Hyperlipidemia 11/28/2015  . Macular degeneration 01/02/2017  . Prostate cancer [facility])      Social History   Tobacco Use  . Smoking status: Former Smoker    Types: Pipe    Quit date: 03/20/1981    Years since quitting: 39.0  . Smokeless tobacco: Never Used  . Tobacco comment: Quit in 1983 pipe smoker   Substance Use Topics  . Alcohol use: No  . Drug use: No    Past Surgical History:  Procedure Laterality Date  . HERNIA REPAIR    . PROSTATE SURGERY  2012    Family History  Problem Relation Age of Onset  . Heart disease Mother        CHF  . Hypertension Mother   . Prostate cancer Father   . Heart disease Maternal Grandmother   . Stroke Maternal Grandmother     No Known Allergies  Current Medications:   Current Outpatient Medications:  .  Apoaequorin (PREVAGEN) 10 MG CAPS, Take 1 capsule by mouth daily., Disp: , Rfl:  .  aspirin 81 MG tablet, Take 81 mg by mouth daily., Disp:  , Rfl:  .  Cholecalciferol (D3-1000 PO), Take by mouth., Disp: , Rfl:  .  CINNAMON PO, Take 1,200 mg by mouth., Disp: , Rfl:  .  clobetasol cream (TEMOVATE) 0.05 %, Apply topically 2 (two) times daily., Disp: 30 g, Rfl: 0 .  doxycycline (VIBRA-TABS) 100 MG tablet, Take 1 tablet (100 mg total) by mouth 2 (two) times daily., Disp: 20 tablet, Rfl: 0 .  FLORA-Q (FLORA-Q) CAPS capsule, Take 1 capsule by mouth daily., Disp: , Rfl:  .  Ginkgo Biloba (GINKOBA PO), 60 mg. Take two tablets twice daily, Disp: , Rfl:  .  Ginkgo Biloba 40 MG TABS, Take by mouth., Disp: , Rfl:  .  KRILL OIL PO, Take 1 capsule by mouth daily., Disp: , Rfl:  .  magnesium gluconate (MAGONATE) 30 MG tablet, Take 400 mg by mouth daily., Disp: , Rfl:  .  Melatonin 10 MG TABS, Take by mouth., Disp: , Rfl:  .  Multiple Vitamins-Minerals (ICAPS AREDS FORMULA PO), Take 2 capsules by mouth daily. , Disp: , Rfl:  .  OVER THE COUNTER MEDICATION, Take 750 mg by mouth daily. GABA, Disp: , Rfl:  .  pantoprazole (PROTONIX) 40 MG tablet, TAKE 1 TABLET(40 MG) BY MOUTH DAILY, Disp: 90 tablet, Rfl: 3 .  Red Yeast Rice 600 MG CAPS, Take 600  mg by mouth daily. , Disp: , Rfl:  .  traZODone (DESYREL) 150 MG tablet, TAKE 1 TABLET(150 MG) BY MOUTH AT BEDTIME AS NEEDED FOR SLEEP, Disp: 90 tablet, Rfl: 3 .  Turmeric (QC TUMERIC COMPLEX PO), Take 1,000 mg by mouth., Disp: , Rfl:  .  vitamin B-12 (CYANOCOBALAMIN) 500 MCG tablet, Take 1,000 mcg by mouth daily. , Disp: , Rfl:  .  Zinc 50 MG CAPS, Take by mouth., Disp: , Rfl:  .  predniSONE (DELTASONE) 20 MG tablet, Take 2 tablets (40 mg total) by mouth daily., Disp: 10 tablet, Rfl: 0   Review of Systems:   ROS  Negative unless otherwise specified per HPI.  Vitals:   Vitals:   04/04/20 1405  BP: 110/70  Pulse: (!) 58  Temp: 98.5 F (36.9 C)  TempSrc: Temporal  SpO2: 97%  Weight: 153 lb (69.4 kg)  Height: 5\' 10"  (1.778 m)     Body mass index is 21.95 kg/m.  Physical Exam:   Physical  Exam Vitals and nursing note reviewed.  Constitutional:      General: He is not in acute distress.    Appearance: He is well-developed. He is not ill-appearing or toxic-appearing.  Cardiovascular:     Rate and Rhythm: Normal rate and regular rhythm.     Pulses: Normal pulses.     Heart sounds: Normal heart sounds, S1 normal and S2 normal.     Comments: No LE edema Pulmonary:     Effort: Pulmonary effort is normal.     Breath sounds: Normal breath sounds.  Musculoskeletal:     Comments: No decreased ROM 2/2 pain with flexion/extension, lateral side bends, or rotation. Reproducible tenderness with deep palpation to bilateral R SI joint. No bony tenderness. No evidence of erythema, rash or ecchymosis. Negative STLR bilaterally.   Skin:    General: Skin is warm and dry.  Neurological:     Mental Status: He is alert.     GCS: GCS eye subscore is 4. GCS verbal subscore is 5. GCS motor subscore is 6.  Psychiatric:        Speech: Speech normal.        Behavior: Behavior normal. Behavior is cooperative.       Assessment and Plan:   Harold Leach was seen today for back pain.  Diagnoses and all orders for this visit:  Function kidney decreased Avoid NSAIDs, update labs today and determine need for further work-up. -     Comprehensive metabolic panel  Acute right-sided low back pain with right-sided sciatica No red flags on exam. Depomedrol injection given today, may start oral prednisone tomorrow. Worsening precautions given. -     methylPREDNISolone acetate (DEPO-MEDROL) injection 80 mg  Other orders -     predniSONE (DELTASONE) 20 MG tablet; Take 2 tablets (40 mg total) by mouth daily.    . Reviewed expectations re: course of current medical issues. . Discussed self-management of symptoms. . Outlined signs and symptoms indicating need for more acute intervention. . Patient verbalized understanding and all questions were answered. . See orders for this visit as documented in the  electronic medical record. . Patient received an After-Visit Summary.  CMA or LPN served as scribe during this visit. History, Physical, and Plan performed by medical provider. The above documentation has been reviewed and is accurate and complete.   Inda Coke, PA-C

## 2020-04-04 NOTE — Patient Instructions (Signed)
It was great to see you!  Start oral prednisone tomorrow.  Do not take additional pain relievers other than tylenol with your oral prednisone unless otherwise instructed by your doctors.  We will be in touch with your lab results.  Take care,  Inda Coke PA-C

## 2020-04-05 ENCOUNTER — Telehealth: Payer: Self-pay | Admitting: Physician Assistant

## 2020-04-05 LAB — COMPREHENSIVE METABOLIC PANEL
ALT: 13 U/L (ref 0–53)
AST: 23 U/L (ref 0–37)
Albumin: 4.4 g/dL (ref 3.5–5.2)
Alkaline Phosphatase: 51 U/L (ref 39–117)
BUN: 28 mg/dL — ABNORMAL HIGH (ref 6–23)
CO2: 31 mEq/L (ref 19–32)
Calcium: 9.4 mg/dL (ref 8.4–10.5)
Chloride: 102 mEq/L (ref 96–112)
Creatinine, Ser: 1.36 mg/dL (ref 0.40–1.50)
GFR: 50.86 mL/min — ABNORMAL LOW (ref >60.00)
Glucose, Bld: 131 mg/dL — ABNORMAL HIGH (ref 70–99)
Potassium: 4.4 mEq/L (ref 3.5–5.1)
Sodium: 140 mEq/L (ref 135–145)
Total Bilirubin: 0.5 mg/dL (ref 0.2–1.2)
Total Protein: 6.5 g/dL (ref 6.0–8.3)

## 2020-04-05 NOTE — Telephone Encounter (Signed)
Pt returned call regarding lab results 

## 2020-04-06 NOTE — Telephone Encounter (Signed)
See result notes. 

## 2020-04-09 ENCOUNTER — Ambulatory Visit: Payer: Medicare Other | Admitting: Physician Assistant

## 2020-04-12 DIAGNOSIS — M5136 Other intervertebral disc degeneration, lumbar region: Secondary | ICD-10-CM | POA: Diagnosis not present

## 2020-04-24 ENCOUNTER — Ambulatory Visit (INDEPENDENT_AMBULATORY_CARE_PROVIDER_SITE_OTHER): Payer: Medicare Other

## 2020-04-24 ENCOUNTER — Other Ambulatory Visit: Payer: Self-pay

## 2020-04-24 DIAGNOSIS — R072 Precordial pain: Secondary | ICD-10-CM

## 2020-04-24 LAB — EXERCISE TOLERANCE TEST
Estimated workload: 7 METS
Exercise duration (min): 6 min
Exercise duration (sec): 0 s
MPHR: 144 {beats}/min
Peak HR: 142 {beats}/min
Percent HR: 98 %
RPE: 15
Rest HR: 62 {beats}/min

## 2020-04-26 ENCOUNTER — Ambulatory Visit (INDEPENDENT_AMBULATORY_CARE_PROVIDER_SITE_OTHER): Payer: Medicare Other

## 2020-04-26 ENCOUNTER — Other Ambulatory Visit: Payer: Self-pay

## 2020-04-26 DIAGNOSIS — Z0000 Encounter for general adult medical examination without abnormal findings: Secondary | ICD-10-CM | POA: Diagnosis not present

## 2020-04-26 NOTE — Patient Instructions (Addendum)
Harold Leach , Thank you for taking time to come for your Medicare Wellness Visit. I appreciate your ongoing commitment to your health goals. Please review the following plan we discussed and let me know if I can assist you in the future.   Screening recommendations/referrals: Colonoscopy: Done 10/01/2015 Recommended yearly ophthalmology/optometry visit for glaucoma screening and checkup Recommended yearly dental visit for hygiene and checkup  Vaccinations: Influenza vaccine: Up to date Pneumococcal vaccine: Up to date  Tdap vaccine: Up to date Shingles vaccine: Completed 06/06/19 & 12/18/19   Covid-19: Completed 1/12 & 11/04/19  Advanced directives: Copy in chart  Conditions/risks identified: Take care of back for better health  Next appointment: Follow up in one year for your annual wellness visit.   Preventive Care 5 Years and Older, Male Preventive care refers to lifestyle choices and visits with your health care provider that can promote health and wellness. What does preventive care include?  A yearly physical exam. This is also called an annual well check.  Dental exams once or twice a year.  Routine eye exams. Ask your health care provider how often you should have your eyes checked.  Personal lifestyle choices, including:  Daily care of your teeth and gums.  Regular physical activity.  Eating a healthy diet.  Avoiding tobacco and drug use.  Limiting alcohol use.  Practicing safe sex.  Taking low doses of aspirin every day.  Taking vitamin and mineral supplements as recommended by your health care provider. What happens during an annual well check? The services and screenings done by your health care provider during your annual well check will depend on your age, overall health, lifestyle risk factors, and family history of disease. Counseling  Your health care provider may ask you questions about your:  Alcohol use.  Tobacco use.  Drug use.  Emotional  well-being.  Home and relationship well-being.  Sexual activity.  Eating habits.  History of falls.  Memory and ability to understand (cognition).  Work and work Statistician. Screening  You may have the following tests or measurements:  Height, weight, and BMI.  Blood pressure.  Lipid and cholesterol levels. These may be checked every 5 years, or more frequently if you are over 58 years old.  Skin check.  Lung cancer screening. You may have this screening every year starting at age 23 if you have a 30-pack-year history of smoking and currently smoke or have quit within the past 15 years.  Fecal occult blood test (FOBT) of the stool. You may have this test every year starting at age 26.  Flexible sigmoidoscopy or colonoscopy. You may have a sigmoidoscopy every 5 years or a colonoscopy every 10 years starting at age 33.  Prostate cancer screening. Recommendations will vary depending on your family history and other risks.  Hepatitis C blood test.  Hepatitis B blood test.  Sexually transmitted disease (STD) testing.  Diabetes screening. This is done by checking your blood sugar (glucose) after you have not eaten for a while (fasting). You may have this done every 1-3 years.  Abdominal aortic aneurysm (AAA) screening. You may need this if you are a current or former smoker.  Osteoporosis. You may be screened starting at age 53 if you are at high risk. Talk with your health care provider about your test results, treatment options, and if necessary, the need for more tests. Vaccines  Your health care provider may recommend certain vaccines, such as:  Influenza vaccine. This is recommended every year.  Tetanus,  diphtheria, and acellular pertussis (Tdap, Td) vaccine. You may need a Td booster every 10 years.  Zoster vaccine. You may need this after age 18.  Pneumococcal 13-valent conjugate (PCV13) vaccine. One dose is recommended after age 56.  Pneumococcal  polysaccharide (PPSV23) vaccine. One dose is recommended after age 36. Talk to your health care provider about which screenings and vaccines you need and how often you need them. This information is not intended to replace advice given to you by your health care provider. Make sure you discuss any questions you have with your health care provider. Document Released: 10/05/2015 Document Revised: 05/28/2016 Document Reviewed: 07/10/2015 Elsevier Interactive Patient Education  2017 [employer] Prevention in the Home Falls can cause injuries. They can happen to people of all ages. There are many things you can do to make your home safe and to help prevent falls. What can I do on the outside of my home?  Regularly fix the edges of walkways and driveways and fix any cracks.  Remove anything that might make you trip as you walk through a door, such as a raised step or threshold.  Trim any bushes or trees on the path to your home.  Use bright outdoor lighting.  Clear any walking paths of anything that might make someone trip, such as rocks or tools.  Regularly check to see if handrails are loose or broken. Make sure that both sides of any steps have handrails.  Any raised decks and porches should have guardrails on the edges.  Have any leaves, snow, or ice cleared regularly.  Use sand or salt on walking paths during winter.  Clean up any spills in your garage right away. This includes oil or grease spills. What can I do in the bathroom?  Use night lights.  Install grab bars by the toilet and in the tub and shower. Do not use towel bars as grab bars.  Use non-skid mats or decals in the tub or shower.  If you need to sit down in the shower, use a plastic, non-slip stool.  Keep the floor dry. Clean up any water that spills on the floor as soon as it happens.  Remove soap buildup in the tub or shower regularly.  Attach bath mats securely with double-sided non-slip rug  tape.  Do not have throw rugs and other things on the floor that can make you trip. What can I do in the bedroom?  Use night lights.  Make sure that you have a light by your bed that is easy to reach.  Do not use any sheets or blankets that are too big for your bed. They should not hang down onto the floor.  Have a firm chair that has side arms. You can use this for support while you get dressed.  Do not have throw rugs and other things on the floor that can make you trip. What can I do in the kitchen?  Clean up any spills right away.  Avoid walking on wet floors.  Keep items that you use a lot in easy-to-reach places.  If you need to reach something above you, use a strong step stool that has a grab bar.  Keep electrical cords out of the way.  Do not use floor polish or wax that makes floors slippery. If you must use wax, use non-skid floor wax.  Do not have throw rugs and other things on the floor that can make you trip. What can I do with  my stairs?  Do not leave any items on the stairs.  Make sure that there are handrails on both sides of the stairs and use them. Fix handrails that are broken or loose. Make sure that handrails are as long as the stairways.  Check any carpeting to make sure that it is firmly attached to the stairs. Fix any carpet that is loose or worn.  Avoid having throw rugs at the top or bottom of the stairs. If you do have throw rugs, attach them to the floor with carpet tape.  Make sure that you have a light switch at the top of the stairs and the bottom of the stairs. If you do not have them, ask someone to add them for you. What else can I do to help prevent falls?  Wear shoes that:  Do not have high heels.  Have rubber bottoms.  Are comfortable and fit you well.  Are closed at the toe. Do not wear sandals.  If you use a stepladder:  Make sure that it is fully opened. Do not climb a closed stepladder.  Make sure that both sides of the  stepladder are locked into place.  Ask someone to hold it for you, if possible.  Clearly mark and make sure that you can see:  Any grab bars or handrails.  First and last steps.  Where the edge of each step is.  Use tools that help you move around (mobility aids) if they are needed. These include:  Canes.  Walkers.  Scooters.  Crutches.  Turn on the lights when you go into a dark area. Replace any light bulbs as soon as they burn out.  Set up your furniture so you have a clear path. Avoid moving your furniture around.  If any of your floors are uneven, fix them.  If there are any pets around you, be aware of where they are.  Review your medicines with your doctor. Some medicines can make you feel dizzy. This can increase your chance of falling. Ask your doctor what other things that you can do to help prevent falls. This information is not intended to replace advice given to you by your health care provider. Make sure you discuss any questions you have with your health care provider. Document Released: 07/05/2009 Document Revised: 02/14/2016 Document Reviewed: 10/13/2014 Elsevier Interactive Patient Education  2017 [employer].

## 2020-04-26 NOTE — Progress Notes (Addendum)
Virtual Visit via Telephone Note  I connected with  Harold Leach on 04/26/20 at 11:45 AM EDT by telephone and verified that I am speaking with the correct person using two identifiers.  Medicare Annual Wellness visit completed telephonically due to Covid-19 pandemic.   Persons participating in this call: This Health Coach and this patient.   Location: Patient: Home Provider: Office    I discussed the limitations, risks, security and privacy concerns of performing an evaluation and management service by telephone and the availability of in person appointments. The patient expressed understanding and agreed to proceed.  Unable to perform video visit due to video visit attempted and failed and/or patient does not have video capability.   Some vital signs may be absent or patient reported.   Willette Brace, LPN    Subjective:   Harold Leach is a 79 y.o. male who presents for Medicare Annual/Subsequent preventive examination.  Review of Systems     Cardiac Risk Factors include: dyslipidemia;male gender     Objective:    Today's Vitals   04/26/20 1154  PainSc: 3    There is no height or weight on file to calculate BMI.  Advanced Directives 04/26/2019 01/21/2018 06/15/2017 01/02/2017 11/13/2015 11/12/2015  Does Patient Have a Medical Advance Directive? Yes Yes Yes Yes Yes No  Type of Paramedic of [place];Living will - [place];Living will Living will;Healthcare Power of [place];Living will -  Copy of [place] in Chart? No - copy requested - - No - copy requested - -  Would patient like information on creating a medical advance directive? - - - - - No - patient declined information    Current Medications (verified) Outpatient Encounter Medications as of 04/26/2020  Medication Sig  . Apoaequorin (PREVAGEN) 10 MG CAPS Take 1 capsule by mouth daily.  Marland Kitchen aspirin 81 MG tablet  Take 81 mg by mouth daily.  . Cholecalciferol (D3-1000 PO) Take by mouth.  Marland Kitchen CINNAMON PO Take 1,200 mg by mouth.  . clobetasol cream (TEMOVATE) 0.05 % Apply topically 2 (two) times daily.  . Ginkgo Biloba (GINKOBA PO) 60 mg. Take two tablets twice daily  . Ginkgo Biloba 40 MG TABS Take by mouth.  Marland Kitchen KRILL OIL PO Take 1 capsule by mouth daily.  . magnesium gluconate (MAGONATE) 30 MG tablet Take 400 mg by mouth daily.  . Melatonin 10 MG TABS Take by mouth.  Marland Kitchen OVER THE COUNTER MEDICATION Take 750 mg by mouth daily. GABA  . pantoprazole (PROTONIX) 40 MG tablet TAKE 1 TABLET(40 MG) BY MOUTH DAILY  . Red Yeast Rice 600 MG CAPS Take 600 mg by mouth daily.   . traZODone (DESYREL) 150 MG tablet TAKE 1 TABLET(150 MG) BY MOUTH AT BEDTIME AS NEEDED FOR SLEEP  . Turmeric (QC TUMERIC COMPLEX PO) Take 1,000 mg by mouth.  . vitamin B-12 (CYANOCOBALAMIN) 500 MCG tablet Take 1,000 mcg by mouth daily.   . Zinc 50 MG CAPS Take by mouth.  . doxycycline (VIBRA-TABS) 100 MG tablet Take 1 tablet (100 mg total) by mouth 2 (two) times daily. (Patient not taking: Reported on 04/26/2020)  . Multiple Vitamins-Minerals (ICAPS AREDS FORMULA PO) Take 2 capsules by mouth daily.  (Patient not taking: Reported on 04/26/2020)  . [DISCONTINUED] FLORA-Q (FLORA-Q) CAPS capsule Take 1 capsule by mouth daily. (Patient not taking: Reported on 04/26/2020)  . [DISCONTINUED] predniSONE (DELTASONE) 20 MG tablet Take 2 tablets (40 mg total)  by mouth daily. (Patient not taking: Reported on 04/26/2020)   No facility-administered encounter medications on file as of 04/26/2020.    Allergies (verified) Patient has no known allergies.   History: Past Medical History:  Diagnosis Date  . 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 Arthralgia of left shoulder region 01/02/2017  . Chronic insomnia 01/02/2017  . 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 Gastroesophageal reflux disease without esophagitis 01/02/2017  . Hyperlipidemia 11/28/2015  .  Macular degeneration 01/02/2017  . Prostate cancer [facility])    Past Surgical History:  Procedure Laterality Date  . HERNIA REPAIR    . PROSTATE SURGERY  2012   Family History  Problem Relation Age of Onset  . Heart disease Mother        CHF  . Hypertension Mother   . Prostate cancer Father   . Heart disease Maternal Grandmother   . Stroke Maternal Grandmother    Social History   Socioeconomic History  . Marital status: Married    Spouse name: Not on file  . Number of children: Not on file  . Years of education: Not on file  . Highest education level: Not on file  Occupational History  . Occupation: Retired  Tobacco Use  . Smoking status: Former Smoker    Types: Pipe    Quit date: 03/20/1981    Years since quitting: 39.1  . Smokeless tobacco: Never Used  . Tobacco comment: Quit in 1983 pipe smoker   Substance and Sexual Activity  . Alcohol use: No  . Drug use: No  . Sexual activity: Yes    Partners: Female  Other Topics Concern  . Not on file  Social History Narrative  . Not on file   Social Determinants of Health   Financial Resource Strain: Low Risk   . Difficulty of Paying Living Expenses: Not hard at all  Food Insecurity: No Food Insecurity  . Worried About Charity fundraiser in the Last Year: Never true  . Ran Out of Food in the Last Year: Never true  Transportation Needs: No Transportation Needs  . Lack of Transportation (Medical): No  . Lack of Transportation (Non-Medical): No  Physical Activity: Insufficiently Active  . Days of Exercise per Week: 7 days  . Minutes of Exercise per Session: 20 min  Stress: No Stress Concern Present  . Feeling of Stress : Not at all  Social Connections: Moderately Integrated  . Frequency of Communication with Friends and Family: More than three times a week  . Frequency of Social Gatherings with Friends and Family: Once a week  . Attends Religious Services: Never  . Active Member of Clubs or Organizations: Yes  .  Attends Archivist Meetings: More than 4 times per year  . Marital Status: Married    Tobacco Counseling Counseling given: Not Answered Comment: Quit in 1983 pipe smoker    Clinical Intake:  Pre-visit preparation completed: Yes  Pain : 0-10 Pain Score: 3  Pain Type: Chronic pain Pain Location: Back Pain Descriptors / Indicators: Dull (goes over to hip down to right leg) Pain Onset: More than a month ago Pain Frequency: Constant     BMI - recorded: 21.95 Nutritional Status: BMI of 19-24  Normal Diabetes: No  How often do you need to have someone help you when you read instructions, pamphlets, or other written materials from your doctor or pharmacy?: 1 - Never  Diabetic?No  Interpreter Needed?: No  Information entered by :: Charlott Rakes,  LPN   Activities of Daily Living In your present state of health, do you have any difficulty performing the following activities: 04/26/2020  Hearing? N  Vision? N  Difficulty concentrating or making decisions? N  Walking or climbing stairs? N  Dressing or bathing? N  Doing errands, shopping? N  Preparing Food and eating ? N  Using the Toilet? N  In the past six months, have you accidently leaked urine? Y  Comment wears pads and briefs mainly at night  Do you have problems with loss of bowel control? N  Managing your Medications? N  Managing your Finances? N  Housekeeping or managing your Housekeeping? N  Some recent data might be hidden    Patient Care Team: Inda Coke, [place] as PCP - General (Physician Assistant) Raynelle Bring, MD as Consulting Physician (Urology)  Indicate any recent Medical Services you may have received from other than [facility] providers in the past year (date may be approximate).     Assessment:   This is a routine wellness examination for Azim.  Hearing/Vision screen  Hearing Screening   125Hz  250Hz  500Hz  1000Hz  2000Hz  3000Hz  4000Hz  6000Hz  8000Hz   Right ear:           Left ear:            Comments: Pt wears hearing aids  Vision Screening Comments: Follows up with annually with eye Dr  Dietary issues and exercise activities discussed: Current Exercise Habits: Home exercise routine, Type of exercise: walking;stretching, Time (Minutes): 15, Frequency (Times/Week): 7, Weekly Exercise (Minutes/Week): 105  Goals    . Increase water intake     . Maintain current health status    . Patient Stated     Quit snacking at hs Try some of the protein ice cream. (Halo, [place] bars and other ice cream with protein in it )     . Patient Stated     Take care of my back and get better      Depression Screen PHQ 2/9 Scores 04/26/2020 07/08/2019 04/26/2019 01/21/2018 01/02/2017 01/02/2017  PHQ - 2 Score 0 0 0 0 0 0  PHQ- 9 Score - 2 - - - -    Fall Risk Fall Risk  04/26/2020 04/26/2019 01/21/2018 01/02/2017 01/02/2017  Falls in the past year? 1 0 Yes No Yes  Comment - - feet tangled up in shop - -  Number falls in past yr: 1 0 1 - -  Injury with Fall? 0 0 - - -  Risk for fall due to : Impaired vision History of fall(s) - - -  Follow up Falls prevention discussed Falls prevention discussed Education provided - -    Any stairs in or around the home? Yes  If so, are there any without handrails? No  Home free of loose throw rugs in walkways, pet beds, electrical cords, etc? Yes  Adequate lighting in your home to reduce risk of falls? Yes   ASSISTIVE DEVICES UTILIZED TO PREVENT FALLS:  Life alert? No  Use of a cane, walker or w/c? Yes  Grab bars in the bathroom? No  Shower chair or bench in shower? Yes  Elevated toilet seat or a handicapped toilet? Yes   TIMED UP AND GO:  Was the test performed? No .    Cognitive Function: MMSE - Mini Mental State Exam 01/02/2017  Orientation to time 5  Orientation to Place 5  Registration 3  Attention/ Calculation 5  Recall 3  Language- name 2 objects 2  Language- repeat 1  Language- follow 3 step command 3  Language- read & follow direction  1  Write a sentence 1  Copy design 1  Total score 30     6CIT Screen 04/26/2020 04/26/2019 01/21/2018  What Year? 0 points 0 points 0 points  What month? 0 points 0 points 0 points  What time? - 0 points 0 points  Count back from 20 0 points 0 points 0 points  Months in reverse 0 points 0 points 0 points  Repeat phrase 0 points 0 points 0 points  Total Score - 0 0    Immunizations Immunization History  Administered Date(s) Administered  . Influenza Whole 06/29/2017  . Influenza, High Dose Seasonal PF 08/21/2016, 07/07/2018, 06/06/2019  . Influenza-Unspecified 08/21/2016  . Moderna SARS-COVID-2 Vaccination 10/04/2019, 11/04/2019  . Pneumococcal Conjugate-13 10/10/2015  . Tdap 09/12/2016  . Zoster Recombinat (Shingrix) 06/06/2019, 12/18/2019    TDAP status: Up to date Flu Vaccine status: Up to date Pneumococcal vaccine status: Up to date Covid-19 vaccine status: Completed vaccines  Qualifies for Shingles Vaccine? Yes   Zostavax completed Yes   Shingrix Completed?: Yes  Screening Tests Health Maintenance  Topic Date Due  . INFLUENZA VACCINE  04/22/2020  . TETANUS/TDAP  09/12/2026  . COVID-19 Vaccine  Completed  . Hepatitis C Screening  Completed  . PNA vac Low Risk Adult  Completed    Health Maintenance  Health Maintenance Due  Topic Date Due  . INFLUENZA VACCINE  04/22/2020    Colorectal cancer screening: Completed 10/01/15. Repeat every 10 years    Additional Screening:  Hepatitis C Screening: Completed 11/12/15  Vision Screening: Recommended annual ophthalmology exams for early detection of glaucoma and other disorders of the eye. Is the patient up to date with their annual eye exam?  Yes  Who is the provider or what is the name of the office in which the patient attends annual eye exams? Follows up with eye dr   Dental Screening: Recommended annual dental exams for proper oral hygiene  Community Resource Referral / Chronic Care Management: CRR required  this visit?  No   CCM required this visit?  No      Plan:     I have personally reviewed and noted the following in the patient's chart:   . Medical and social history . Use of alcohol, tobacco or illicit drugs  . Current medications and supplements . Functional ability and status . Nutritional status . Physical activity . Advanced directives . List of other physicians . Hospitalizations, surgeries, and ER visits in previous 12 months . Vitals . Screenings to include cognitive, depression, and falls . Referrals and appointments  In addition, I have reviewed and discussed with patient certain preventive protocols, quality metrics, and best practice recommendations. A written personalized care plan for preventive services as well as general preventive health recommendations were provided to patient.     Willette Brace, LPN   [id number]   Nurse Notes: None

## 2020-05-14 DIAGNOSIS — M545 Low back pain: Secondary | ICD-10-CM | POA: Diagnosis not present

## 2020-05-14 DIAGNOSIS — M5136 Other intervertebral disc degeneration, lumbar region: Secondary | ICD-10-CM | POA: Diagnosis not present

## 2020-05-31 DIAGNOSIS — M545 Low back pain: Secondary | ICD-10-CM | POA: Diagnosis not present

## 2020-06-08 ENCOUNTER — Other Ambulatory Visit: Payer: Self-pay | Admitting: Physical Medicine and Rehabilitation

## 2020-06-08 DIAGNOSIS — M5136 Other intervertebral disc degeneration, lumbar region: Secondary | ICD-10-CM | POA: Diagnosis not present

## 2020-06-08 DIAGNOSIS — M545 Low back pain, unspecified: Secondary | ICD-10-CM

## 2020-06-13 DIAGNOSIS — Z23 Encounter for immunization: Secondary | ICD-10-CM | POA: Diagnosis not present

## 2020-06-15 ENCOUNTER — Other Ambulatory Visit: Payer: Medicare Other

## 2020-06-20 ENCOUNTER — Ambulatory Visit
Admission: RE | Admit: 2020-06-20 | Discharge: 2020-06-20 | Disposition: A | Discharge status: Home / Self Care | Payer: Medicare Other | Source: Ambulatory Visit | Attending: Physical Medicine and Rehabilitation | Admitting: Physical Medicine and Rehabilitation

## 2020-06-20 DIAGNOSIS — N281 Cyst of kidney, acquired: Secondary | ICD-10-CM | POA: Diagnosis not present

## 2020-06-20 DIAGNOSIS — M545 Low back pain, unspecified: Secondary | ICD-10-CM

## 2020-06-27 DIAGNOSIS — Z23 Encounter for immunization: Secondary | ICD-10-CM | POA: Diagnosis not present

## 2020-07-23 ENCOUNTER — Encounter: Payer: Self-pay | Admitting: Physician Assistant

## 2020-07-23 ENCOUNTER — Other Ambulatory Visit: Payer: Self-pay

## 2020-07-23 ENCOUNTER — Ambulatory Visit (INDEPENDENT_AMBULATORY_CARE_PROVIDER_SITE_OTHER): Payer: Medicare Other | Admitting: Physician Assistant

## 2020-07-23 VITALS — BP 110/70 | HR 63 | Temp 98.1°F | Ht 69.5 in | Wt 152.5 lb

## 2020-07-23 DIAGNOSIS — R351 Nocturia: Secondary | ICD-10-CM

## 2020-07-23 DIAGNOSIS — K219 Gastro-esophageal reflux disease without esophagitis: Secondary | ICD-10-CM | POA: Diagnosis not present

## 2020-07-23 DIAGNOSIS — N289 Disorder of kidney and ureter, unspecified: Secondary | ICD-10-CM | POA: Diagnosis not present

## 2020-07-23 DIAGNOSIS — Z0000 Encounter for general adult medical examination without abnormal findings: Secondary | ICD-10-CM

## 2020-07-23 DIAGNOSIS — E559 Vitamin D deficiency, unspecified: Secondary | ICD-10-CM

## 2020-07-23 DIAGNOSIS — Z9079 Acquired absence of other genital organ(s): Secondary | ICD-10-CM

## 2020-07-23 DIAGNOSIS — F5104 Psychophysiologic insomnia: Secondary | ICD-10-CM

## 2020-07-23 DIAGNOSIS — E7800 Pure hypercholesterolemia, unspecified: Secondary | ICD-10-CM | POA: Diagnosis not present

## 2020-07-23 DIAGNOSIS — K5900 Constipation, unspecified: Secondary | ICD-10-CM | POA: Diagnosis not present

## 2020-07-23 NOTE — Progress Notes (Addendum)
I acted as a Education administrator for [employer], PA-C [employer], LPN   Subjective:    Harold Leach is a 79 y.o. male and is here for a comprehensive physical exam.   HPI  There are no preventive care reminders to display for this patient.  Acute Concerns: Constipation -- takes 1/4 of miralax daily without significant relief of symptoms. He has also trialed colace without significant relief, but has not done either consistently or together. Denies rectal bleeding. Kidney disease -- has had some issues with kidney disease GFR in the 40-50's over the past last year. He had an u/s of his kidneys due to finding of atrophy on MRI done on lumbar MRI.  Denies fatigue, nausea, change in coloration of urine, issues with voiding.  Chronic Issues: GERD -- takes protonix 40 mg daily, overall well managed with this. Chronic insomnia -- takes trazodone 150 mg daily, overall well managed with this. Chronic LBP -- was seeing Dr. Nelva Harold and was not having significant response to his injections, so an MRI was performed. 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 Anterolisthesis of L5 on S1. L renal atrophy. He is currently doing well with his LBP. Denies any saddle anesthesia, loss of bowel/bladder function, significant weakness in b/l LE. Nocturia/Prostatectomy hx -- prostatectomy in 2012. Gets yearly prostate levels since that time per instruction of urology. Vit D deficiency -- would like labs checked today, takes 1000 IU daily  Health Maintenance: Immunizations -- UTD Colonoscopy -- done in 2017, due in 2027 PSA -- done 2019 normal Diet -- recently eating more takeout as wife is recovering from surgery and is not doing much cooking Sleep habits -- does well with trazodone Exercise -- walks regularly Weight -- Weight: 152 lb 8 oz (69.2 kg)  Recent weight history Wt Readings from Last 10 Encounters:  07/23/20 152 lb  8 oz (69.2 kg)  04/04/20 153 lb (69.4 kg)  03/28/20 152 lb (68.9 kg)  03/21/20 153 lb (69.4 kg)  11/29/19 154 lb 8 oz (70.1 kg)  09/21/19 159 lb 4 oz (72.2 kg)  07/08/19 148 lb (67.1 kg)  10/11/18 172 lb 4 oz (78.1 kg)  07/07/18 167 lb 9.6 oz (76 kg)  01/21/18 165 lb (74.8 kg)   Body mass index is 22.2 kg/m. Mood -- stable Alcohol use -- none Tobacco use -- former  Depression screen PHQ 2/9 07/23/2020  Decreased Interest 0  Down, Depressed, Hopeless 0  PHQ - 2 Score 0  Altered sleeping -  Tired, decreased energy -  Change in appetite -  Feeling bad or failure about yourself  -  Trouble concentrating -  Moving slowly or fidgety/restless -  Suicidal thoughts -  PHQ-9 Score -  Difficult doing work/chores -    Other providers/specialists: Patient Care Team: Inda Coke, [place] as PCP - General (Physician Assistant) Raynelle Bring, MD as Consulting Physician (Urology)     PMHx, SurgHx, SocialHx, Medications, and Allergies were reviewed in the Visit Navigator and updated as appropriate.   Past Medical History:  Diagnosis Date  . 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 Arthralgia of left shoulder region 01/02/2017  . Chronic insomnia 01/02/2017  . 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 Gastroesophageal reflux disease without esophagitis 01/02/2017  . Hyperlipidemia 11/28/2015  . Macular degeneration 01/02/2017  . Prostate cancer [facility])      Past Surgical History:  Procedure Laterality Date  . HERNIA REPAIR    .  PROSTATE SURGERY  2012     Family History  Problem Relation Age of Onset  . Heart disease Mother        CHF  . Hypertension Mother   . Prostate cancer Father   . Heart disease Maternal Grandmother   . Stroke Maternal Grandmother     Social History   Tobacco Use  . Smoking status: Former Smoker    Types: Pipe    Quit date: 03/20/1981    Years since quitting: 39.3  . Smokeless tobacco: Never Used  . Tobacco comment: Quit in 1983 pipe smoker    Substance Use Topics  . Alcohol use: No  . Drug use: No    Review of Systems:   Review of Systems  Constitutional: Positive for weight loss. Negative for chills, fever and malaise/fatigue.  HENT: Negative.  Negative for hearing loss, sinus pain and sore throat.   Eyes: Negative.  Negative for blurred vision.  Respiratory: Negative.  Negative for cough and shortness of breath.   Cardiovascular: Negative.  Negative for chest pain, palpitations and leg swelling.  Gastrointestinal: Positive for constipation. Negative for abdominal pain, diarrhea, heartburn, nausea and vomiting.  Genitourinary: Negative.  Negative for dysuria, frequency and urgency.  Musculoskeletal: Negative.  Negative for back pain, myalgias and neck pain.  Skin: Negative.  Negative for itching and rash.  Neurological: Negative.  Negative for dizziness, tingling, seizures, loss of consciousness and headaches.  Endo/Heme/Allergies: Negative.  Negative for polydipsia.  Psychiatric/Behavioral: Negative.  Negative for depression. The patient is not nervous/anxious.     Objective:    Vitals:   07/23/20 0835  BP: 110/70  Pulse: 63  Temp: 98.1 F (36.7 C)  SpO2: 98%   Body mass index is 22.2 kg/m.  General  Alert, cooperative, no distress, appears stated age  Head:  Normocephalic, without obvious abnormality, atraumatic  Eyes:  PERRL, conjunctiva/corneas clear, EOM's intact, fundi benign, both eyes       Ears:  Normal TM's and external ear canals, both ears  Nose: Nares normal, septum midline, mucosa normal, no drainage or sinus tenderness  Throat: Lips, mucosa, and tongue normal; teeth and gums normal  Neck: Supple, symmetrical, trachea midline, no adenopathy;     thyroid:  No enlargement/tenderness/nodules; no carotid bruit or JVD  Back:   Symmetric, no curvature, ROM normal, no CVA tenderness  Lungs:   Clear to auscultation bilaterally, respirations unlabored  Chest wall:  No tenderness or deformity  Heart:   Regular rate and rhythm, S1 and S2 normal, no murmur, rub or gallop  Abdomen:   Soft, non-tender, bowel sounds active all four quadrants, no masses, no organomegaly  Extremities: Extremities normal, atraumatic, no cyanosis or edema  Prostate : Not done.   Skin: Skin color, texture, turgor normal, no rashes or lesions  Lymph nodes: Cervical, supraclavicular, and axillary nodes normal  Neurologic: CNII-XII grossly intact. Normal strength, sensation and reflexes throughout   AssessmentPlan:   Braidon was seen today for annual exam.  Diagnoses and all orders for this visit:  Routine physical examination Today patient counseled on age appropriate routine health concerns for screening and prevention, each reviewed and up to date or declined. Immunizations reviewed and up to date or declined. Labs ordered and reviewed. Risk factors for depression reviewed and negative. Hearing function and visual acuity are intact. ADLs screened and addressed as needed. Functional ability and level of safety reviewed and appropriate. Education, counseling and referrals performed based on assessed risks today. Patient provided with a  copy of personalized plan for preventive services.  Gastroesophageal reflux disease without esophagitis Stable on 40 mg protonix daily.  Chronic insomnia Stable on 150 mg trazodone daily.  Vitamin D deficiency Update labs today and provide recommendations on supplements accordingly. -     VITAMIN D 25 Hydroxy (Vit-D Deficiency, Fractures); Future -     VITAMIN D 25 Hydroxy (Vit-D Deficiency, Fractures)  Function kidney decreased Referral to nephrology. Update labs today. Is not on any nephrotoxic agents.  Discussed hydration needs. -     CBC with Differential/Platelet; Future -     Comprehensive metabolic panel; Future -     Comprehensive metabolic panel -     CBC with Differential/Platelet  Pure hypercholesterolemia Update lipid panel. He has significant bleeding with  his daily ASA, feels like he has significant bleeding with cuts. Recently read about ASA on the news, worsening concerns for developing head bleed. At this point, I recommend that he stop ASA due to his bleeding. -     Lipid panel; Future -     Lipid panel  History of prostatectomy; Nocturia Update PSA today. If any concerns, refer back to urology. -     PSA; Future -     PSA  Constipation No red flags on exam. Start daily miralax and colace. Follow-up if no improvement of symptoms. Handout provided.  Well Adult Exam: Labs ordered: Yes. Patient counseling was done. See below for items discussed.   Discussed the patient's BMI.  The BMI BMI is in the acceptable range   Follow up in 3 months.   Patient Counseling: [x]   Nutrition: Stressed importance of moderation in sodium/caffeine intake, saturated fat and cholesterol, caloric balance, sufficient intake of fresh fruits, vegetables, and fiber  [x]   Stressed the importance of regular exercise.   []   Substance Abuse: Discussed cessation/primary prevention of tobacco, alcohol, or other drug use; driving or other dangerous activities under the influence; availability of treatment for abuse.   [x]   Injury prevention: Discussed safety belts, safety helmets, smoke detector, smoking near bedding or upholstery.   []   Sexuality: Discussed sexually transmitted diseases, partner selection, use of condoms, avoidance of unintended pregnancy  and contraceptive alternatives.   [x]   Dental health: Discussed importance of regular tooth brushing, flossing, and dental visits.  [x]   Health maintenance and immunizations reviewed. Please refer to Health maintenance section.   CMA or LPN served as scribe during this visit. History, Physical, and Plan performed by medical provider. The above documentation has been reviewed and is accurate and complete.  Inda Coke, PA-C [place]

## 2020-07-23 NOTE — Patient Instructions (Addendum)
It was great to see you!  For your bowels: -- 1 capful of Miralax daily --1-2 capsules of colace daily -- do this until you have regular bowel movements -- push for 8 glasses of water daily  Stop aspirin  Referral today for the kidney doctor  Please go to the lab for blood work.   Our office will call you with your results unless you have chosen to receive results via MyChart.  If your blood work is normal we will follow-up each year for physicals and as scheduled for chronic medical problems.  If anything is abnormal we will treat accordingly and get you in for a follow-up.  Take care,  [facility] Maintenance, Male Adopting a healthy lifestyle and getting preventive care are important in promoting health and wellness. Ask your health care provider about:  The right schedule for you to have regular tests and exams.  Things you can do on your own to prevent diseases and keep yourself healthy. What should I know about diet, weight, and exercise? Eat a healthy diet   Eat a diet that includes plenty of vegetables, fruits, low-fat dairy products, and lean protein.  Do not eat a lot of foods that are high in solid fats, added sugars, or sodium. Maintain a healthy weight Body mass index (BMI) is a measurement that can be used to identify possible weight problems. It estimates body fat based on height and weight. Your health care provider can help determine your BMI and help you achieve or maintain a healthy weight. Get regular exercise Get regular exercise. This is one of the most important things you can do for your health. Most adults should:  Exercise for at least 150 minutes each week. The exercise should increase your heart rate and make you sweat (moderate-intensity exercise).  Do strengthening exercises at least twice a week. This is in addition to the moderate-intensity exercise.  Spend less time sitting. Even light physical activity can be beneficial. Watch  cholesterol and blood lipids Have your blood tested for lipids and cholesterol at 77 years of age, then have this test every 5 years. You may need to have your cholesterol levels checked more often if:  Your lipid or cholesterol levels are high.  You are older than 77 years of age.  You are at high risk for heart disease. What should I know about cancer screening? Many types of cancers can be detected early and may often be prevented. Depending on your health history and family history, you may need to have cancer screening at various ages. This may include screening for:  Colorectal cancer.  Prostate cancer.  Skin cancer.  Lung cancer. What should I know about heart disease, diabetes, and high blood pressure? Blood pressure and heart disease  High blood pressure causes heart disease and increases the risk of stroke. This is more likely to develop in people who have high blood pressure readings, are of African descent, or are overweight.  Talk with your health care provider about your target blood pressure readings.  Have your blood pressure checked: ? Every 3-5 years if you are 65-64 years of age. ? Every year if you are 19 years old or older.  If you are between the ages of 53 and 15 and are a current or former smoker, ask your health care provider if you should have a one-time screening for abdominal aortic aneurysm (AAA). Diabetes Have regular diabetes screenings. This checks your fasting blood sugar level. Have the  screening done:  Once every three years after age 37 if you are at a normal weight and have a low risk for diabetes.  More often and at a younger age if you are overweight or have a high risk for diabetes. What should I know about preventing infection? Hepatitis B If you have a higher risk for hepatitis B, you should be screened for this virus. Talk with your health care provider to find out if you are at risk for hepatitis B infection. Hepatitis C Blood  testing is recommended for:  Everyone born from [birth year] through 1963.  Anyone with known risk factors for hepatitis C. Sexually transmitted infections (STIs)  You should be screened each year for STIs, including gonorrhea and chlamydia, if: ? You are sexually active and are younger than 77 years of age. ? You are older than 77 years of age and your health care provider tells you that you are at risk for this type of infection. ? Your sexual activity has changed since you were last screened, and you are at increased risk for chlamydia or gonorrhea. Ask your health care provider if you are at risk.  Ask your health care provider about whether you are at high risk for HIV. Your health care provider may recommend a prescription medicine to help prevent HIV infection. If you choose to take medicine to prevent HIV, you should first get tested for HIV. You should then be tested every 3 months for as long as you are taking the medicine. Follow these instructions at home: Lifestyle  Do not use any products that contain nicotine or tobacco, such as cigarettes, e-cigarettes, and chewing tobacco. If you need help quitting, ask your health care provider.  Do not use street drugs.  Do not share needles.  Ask your health care provider for help if you need support or information about quitting drugs. Alcohol use  Do not drink alcohol if your health care provider tells you not to drink.  If you drink alcohol: ? Limit how much you have to 0-2 drinks a day. ? Be aware of how much alcohol is in your drink. In the [place] one drink equals one 12 oz bottle of beer (355 mL), one 5 oz glass of wine (148 mL), or one 1 oz glass of hard liquor (44 mL). General instructions  Schedule regular health, dental, and eye exams.  Stay current with your vaccines.  Tell your health care provider if: ? You often feel depressed. ? You have ever been abused or do not feel safe at home. Summary  Adopting a healthy  lifestyle and getting preventive care are important in promoting health and wellness.  Follow your health care provider's instructions about healthy diet, exercising, and getting tested or screened for diseases.  Follow your health care provider's instructions on monitoring your cholesterol and blood pressure. This information is not intended to replace advice given to you by your health care provider. Make sure you discuss any questions you have with your health care provider. Document Revised: 09/01/2018 Document Reviewed: 09/01/2018 Elsevier Patient Education  2020 [employer].

## 2020-07-24 LAB — LIPID PANEL
Cholesterol: 182 mg/dL (ref <200)
HDL: 64 mg/dL (ref >=40)
LDL Cholesterol (Calc): 98 mg/dL (calc)
Non-HDL Cholesterol (Calc): 118 mg/dL (calc) (ref <130)
Total CHOL/HDL Ratio: 2.8 (calc) (ref <5.0)
Triglycerides: 102 mg/dL (ref <150)

## 2020-07-24 LAB — COMPREHENSIVE METABOLIC PANEL
AG Ratio: 1.8 (calc) (ref 1.0–2.5)
ALT: 12 U/L (ref 9–46)
AST: 17 U/L (ref 10–35)
Albumin: 4.2 g/dL (ref 3.6–5.1)
Alkaline phosphatase (APISO): 50 U/L (ref 35–144)
BUN/Creatinine Ratio: 20 (calc) (ref 6–22)
BUN: 25 mg/dL (ref 7–25)
CO2: 34 mmol/L — ABNORMAL HIGH (ref 20–32)
Calcium: 9.8 mg/dL (ref 8.6–10.3)
Chloride: 104 mmol/L (ref 98–110)
Creat: 1.24 mg/dL — ABNORMAL HIGH (ref 0.70–1.18)
Globulin: 2.4 g/dL (calc) (ref 1.9–3.7)
Glucose, Bld: 78 mg/dL (ref 65–99)
Potassium: 4.5 mmol/L (ref 3.5–5.3)
Sodium: 143 mmol/L (ref 135–146)
Total Bilirubin: 0.4 mg/dL (ref 0.2–1.2)
Total Protein: 6.6 g/dL (ref 6.1–8.1)

## 2020-07-24 LAB — CBC WITH DIFFERENTIAL/PLATELET
Absolute Monocytes: 492 cells/uL (ref 200–950)
Basophils Absolute: 69 cells/uL (ref 0–200)
Basophils Relative: 1.5 %
Eosinophils Absolute: 143 cells/uL (ref 15–500)
Eosinophils Relative: 3.1 %
HCT: 41.1 % (ref 38.5–50.0)
Hemoglobin: 13.8 g/dL (ref 13.2–17.1)
Lymphs Abs: 1155 cells/uL (ref 850–3900)
MCH: 30.7 pg (ref 27.0–33.0)
MCHC: 33.6 g/dL (ref 32.0–36.0)
MCV: 91.5 fL (ref 80.0–100.0)
MPV: 10 fL (ref 7.5–12.5)
Monocytes Relative: 10.7 %
Neutro Abs: 2742 cells/uL (ref 1500–7800)
Neutrophils Relative %: 59.6 %
Platelets: 165 10*3/uL (ref 140–400)
RBC: 4.49 10*6/uL (ref 4.20–5.80)
RDW: 13.4 % (ref 11.0–15.0)
Total Lymphocyte: 25.1 %
WBC: 4.6 10*3/uL (ref 3.8–10.8)

## 2020-07-24 LAB — PSA: PSA: <0.04 ng/mL (ref <=4.0)

## 2020-07-24 LAB — VITAMIN D 25 HYDROXY (VIT D DEFICIENCY, FRACTURES): Vit D, 25-Hydroxy: 83 ng/mL (ref 30–100)

## 2020-08-22 ENCOUNTER — Telehealth: Payer: Self-pay

## 2020-08-22 ENCOUNTER — Other Ambulatory Visit: Payer: Self-pay | Admitting: Physician Assistant

## 2020-08-22 DIAGNOSIS — N289 Disorder of kidney and ureter, unspecified: Secondary | ICD-10-CM

## 2020-08-22 NOTE — Telephone Encounter (Signed)
Please advise if you would still like referral placed

## 2020-08-22 NOTE — Telephone Encounter (Signed)
Please call patient and let him know that I have placed the referral.  He may contact [place] at [phone] as soon as next week to try to schedule appointment.  I apologize for the inconvenience.  Aldona Bar

## 2020-08-22 NOTE — Telephone Encounter (Signed)
Pt states he has been waiting 4 weeks to hear from the kidney dr from the referral that was placed. However, when looking through the chart I do not see that a referral was placed. However, I do see in the Pt's avs on 11/1 it mentions a referral to a kidney Dr.

## 2020-08-23 NOTE — Telephone Encounter (Signed)
Called pt and relayed message below. He states he will call [facility] at the beginning of next week.

## 2020-09-15 ENCOUNTER — Other Ambulatory Visit: Payer: Self-pay | Admitting: Family Medicine

## 2020-09-15 DIAGNOSIS — K219 Gastro-esophageal reflux disease without esophagitis: Secondary | ICD-10-CM

## 2020-09-15 DIAGNOSIS — F5104 Psychophysiologic insomnia: Secondary | ICD-10-CM

## 2020-10-11 DIAGNOSIS — H25812 Combined forms of age-related cataract, left eye: Secondary | ICD-10-CM | POA: Diagnosis not present

## 2020-10-11 DIAGNOSIS — H5202 Hypermetropia, left eye: Secondary | ICD-10-CM | POA: Diagnosis not present

## 2020-10-11 DIAGNOSIS — Z961 Presence of intraocular lens: Secondary | ICD-10-CM | POA: Diagnosis not present

## 2020-10-11 DIAGNOSIS — H26491 Other secondary cataract, right eye: Secondary | ICD-10-CM | POA: Diagnosis not present

## 2020-10-19 DIAGNOSIS — N1831 Chronic kidney disease, stage 3a: Secondary | ICD-10-CM | POA: Diagnosis not present

## 2020-10-19 DIAGNOSIS — E119 Type 2 diabetes mellitus without complications: Secondary | ICD-10-CM | POA: Diagnosis not present

## 2020-10-19 DIAGNOSIS — N281 Cyst of kidney, acquired: Secondary | ICD-10-CM | POA: Diagnosis not present

## 2020-10-19 DIAGNOSIS — G8929 Other chronic pain: Secondary | ICD-10-CM | POA: Diagnosis not present

## 2020-10-19 DIAGNOSIS — R011 Cardiac murmur, unspecified: Secondary | ICD-10-CM | POA: Diagnosis not present

## 2020-10-19 DIAGNOSIS — E559 Vitamin D deficiency, unspecified: Secondary | ICD-10-CM | POA: Diagnosis not present

## 2020-10-19 DIAGNOSIS — R030 Elevated blood-pressure reading, without diagnosis of hypertension: Secondary | ICD-10-CM | POA: Diagnosis not present

## 2020-10-19 DIAGNOSIS — C61 Malignant neoplasm of prostate: Secondary | ICD-10-CM | POA: Diagnosis not present

## 2020-10-19 DIAGNOSIS — E785 Hyperlipidemia, unspecified: Secondary | ICD-10-CM | POA: Diagnosis not present

## 2020-10-19 DIAGNOSIS — D631 Anemia in chronic kidney disease: Secondary | ICD-10-CM | POA: Diagnosis not present

## 2020-11-16 ENCOUNTER — Other Ambulatory Visit: Payer: Self-pay | Admitting: Family Medicine

## 2020-12-06 DIAGNOSIS — H26491 Other secondary cataract, right eye: Secondary | ICD-10-CM | POA: Diagnosis not present

## 2020-12-27 ENCOUNTER — Telehealth: Payer: Self-pay

## 2020-12-27 DIAGNOSIS — H9190 Unspecified hearing loss, unspecified ear: Secondary | ICD-10-CM

## 2020-12-27 NOTE — Telephone Encounter (Signed)
Spoke to pt told him referral for [facility] has been placed for him and his wife. Our referral coordinator will send referrals over. Pt verbalized understanding.

## 2020-12-27 NOTE — Telephone Encounter (Signed)
Pt asked for referral to [facility] for a hearing test. Please advise. His appointment is on 01/17/2021.

## 2021-01-17 DIAGNOSIS — H903 Sensorineural hearing loss, bilateral: Secondary | ICD-10-CM | POA: Diagnosis not present

## 2021-01-17 DIAGNOSIS — Z77122 Contact with and (suspected) exposure to noise: Secondary | ICD-10-CM | POA: Diagnosis not present

## 2021-02-07 DIAGNOSIS — Z23 Encounter for immunization: Secondary | ICD-10-CM | POA: Diagnosis not present

## 2021-02-11 ENCOUNTER — Other Ambulatory Visit: Payer: Self-pay | Admitting: Family

## 2021-02-11 MED ORDER — CLOBETASOL PROPIONATE 0.05 % EX CREA
TOPICAL_CREAM | Freq: Two times a day (BID) | CUTANEOUS | 0 refills | Status: DC
Start: 2021-02-11 — End: 2022-03-03

## 2021-02-28 ENCOUNTER — Telehealth: Payer: Self-pay

## 2021-02-28 DIAGNOSIS — K219 Gastro-esophageal reflux disease without esophagitis: Secondary | ICD-10-CM

## 2021-02-28 DIAGNOSIS — F5104 Psychophysiologic insomnia: Secondary | ICD-10-CM

## 2021-02-28 MED ORDER — PANTOPRAZOLE SODIUM 40 MG PO TBEC: DELAYED_RELEASE_TABLET | ORAL | 3 refills | Status: DC

## 2021-02-28 MED ORDER — TRAZODONE HCL 150 MG PO TABS: ORAL_TABLET | ORAL | 3 refills | Status: DC

## 2021-02-28 NOTE — Telephone Encounter (Signed)
Rx for pantoprazole sent to pharmacy.

## 2021-02-28 NOTE — Telephone Encounter (Signed)
.   LAST APPOINTMENT DATE: 12/27/2020   NEXT APPOINTMENT DATE:@8 /08/2021  MEDICATION:traZODone (DESYREL) 150 MG tablet  pantoprazole (PROTONIX) 40 MG tablet   PHARMACY: [id number] - [place], [place] - [address]

## 2021-03-01 NOTE — Telephone Encounter (Signed)
Trazodone sent in

## 2021-05-03 ENCOUNTER — Ambulatory Visit (INDEPENDENT_AMBULATORY_CARE_PROVIDER_SITE_OTHER): Payer: Medicare Other

## 2021-05-03 DIAGNOSIS — Z0000 Encounter for general adult medical examination without abnormal findings: Secondary | ICD-10-CM | POA: Diagnosis not present

## 2021-05-03 NOTE — Patient Instructions (Signed)
Harold Leach , Thank you for taking time to come for your Medicare Wellness Visit. I appreciate your ongoing commitment to your health goals. Please review the following plan we discussed and let me know if I can assist you in the future.   Screening recommendations/referrals: Colonoscopy: Done 10/01/15 Recommended yearly ophthalmology/optometry visit for glaucoma screening and checkup Recommended yearly dental visit for hygiene and checkup  Vaccinations: Influenza vaccine: Due  Pneumococcal vaccine: Completed  Tdap vaccine: Done 09/12/16 repeat every 10 years due 09/12/26 Shingles vaccine: Completed 06/06/19 & 12/18/19   Covid-19: Completed 1/12, 2/12, & 06/27/20  Advanced directives: Copies In chart  Conditions/risks identified: Keep living   Next appointment: Follow up in one year for your annual wellness visit.   Preventive Care 18 Years and Older, Male Preventive care refers to lifestyle choices and visits with your health care provider that can promote health and wellness. What does preventive care include? A yearly physical exam. This is also called an annual well check. Dental exams once or twice a year. Routine eye exams. Ask your health care provider how often you should have your eyes checked. Personal lifestyle choices, including: Daily care of your teeth and gums. Regular physical activity. Eating a healthy diet. Avoiding tobacco and drug use. Limiting alcohol use. Practicing safe sex. Taking low doses of aspirin every day. Taking vitamin and mineral supplements as recommended by your health care provider. What happens during an annual well check? The services and screenings done by your health care provider during your annual well check will depend on your age, overall health, lifestyle risk factors, and family history of disease. Counseling  Your health care provider may ask you questions about your: Alcohol use. Tobacco use. Drug use. Emotional well-being. Home and  relationship well-being. Sexual activity. Eating habits. History of falls. Memory and ability to understand (cognition). Work and work Statistician. Screening  You may have the following tests or measurements: Height, weight, and BMI. Blood pressure. Lipid and cholesterol levels. These may be checked every 5 years, or more frequently if you are over 16 years old. Skin check. Lung cancer screening. You may have this screening every year starting at age 70 if you have a 30-pack-year history of smoking and currently smoke or have quit within the past 15 years. Fecal occult blood test (FOBT) of the stool. You may have this test every year starting at age 63. Flexible sigmoidoscopy or colonoscopy. You may have a sigmoidoscopy every 5 years or a colonoscopy every 10 years starting at age 26. Prostate cancer screening. Recommendations will vary depending on your family history and other risks. Hepatitis C blood test. Hepatitis B blood test. Sexually transmitted disease (STD) testing. Diabetes screening. This is done by checking your blood sugar (glucose) after you have not eaten for a while (fasting). You may have this done every 1-3 years. Abdominal aortic aneurysm (AAA) screening. You may need this if you are a current or former smoker. Osteoporosis. You may be screened starting at age 60 if you are at high risk. Talk with your health care provider about your test results, treatment options, and if necessary, the need for more tests. Vaccines  Your health care provider may recommend certain vaccines, such as: Influenza vaccine. This is recommended every year. Tetanus, diphtheria, and acellular pertussis (Tdap, Td) vaccine. You may need a Td booster every 10 years. Zoster vaccine. You may need this after age 29. Pneumococcal 13-valent conjugate (PCV13) vaccine. One dose is recommended after age 84. Pneumococcal polysaccharide (  PPSV23) vaccine. One dose is recommended after age 104. Talk to your  health care provider about which screenings and vaccines you need and how often you need them. This information is not intended to replace advice given to you by your health care provider. Make sure you discuss any questions you have with your health care provider. Document Released: 10/05/2015 Document Revised: 05/28/2016 Document Reviewed: 07/10/2015 Elsevier Interactive Patient Education  2017 [place] Prevention in the Home Falls can cause injuries. They can happen to people of all ages. There are many things you can do to make your home safe and to help prevent falls. What can I do on the outside of my home? Regularly fix the edges of walkways and driveways and fix any cracks. Remove anything that might make you trip as you walk through a door, such as a raised step or threshold. Trim any bushes or trees on the path to your home. Use bright outdoor lighting. Clear any walking paths of anything that might make someone trip, such as rocks or tools. Regularly check to see if handrails are loose or broken. Make sure that both sides of any steps have handrails. Any raised decks and porches should have guardrails on the edges. Have any leaves, snow, or ice cleared regularly. Use sand or salt on walking paths during winter. Clean up any spills in your garage right away. This includes oil or grease spills. What can I do in the bathroom? Use night lights. Install grab bars by the toilet and in the tub and shower. Do not use towel bars as grab bars. Use non-skid mats or decals in the tub or shower. If you need to sit down in the shower, use a plastic, non-slip stool. Keep the floor dry. Clean up any water that spills on the floor as soon as it happens. Remove soap buildup in the tub or shower regularly. Attach bath mats securely with double-sided non-slip rug tape. Do not have throw rugs and other things on the floor that can make you trip. What can I do in the bedroom? Use night  lights. Make sure that you have a light by your bed that is easy to reach. Do not use any sheets or blankets that are too big for your bed. They should not hang down onto the floor. Have a firm chair that has side arms. You can use this for support while you get dressed. Do not have throw rugs and other things on the floor that can make you trip. What can I do in the kitchen? Clean up any spills right away. Avoid walking on wet floors. Keep items that you use a lot in easy-to-reach places. If you need to reach something above you, use a strong step stool that has a grab bar. Keep electrical cords out of the way. Do not use floor polish or wax that makes floors slippery. If you must use wax, use non-skid floor wax. Do not have throw rugs and other things on the floor that can make you trip. What can I do with my stairs? Do not leave any items on the stairs. Make sure that there are handrails on both sides of the stairs and use them. Fix handrails that are broken or loose. Make sure that handrails are as long as the stairways. Check any carpeting to make sure that it is firmly attached to the stairs. Fix any carpet that is loose or worn. Avoid having throw rugs at the top or  bottom of the stairs. If you do have throw rugs, attach them to the floor with carpet tape. Make sure that you have a light switch at the top of the stairs and the bottom of the stairs. If you do not have them, ask someone to add them for you. What else can I do to help prevent falls? Wear shoes that: Do not have high heels. Have rubber bottoms. Are comfortable and fit you well. Are closed at the toe. Do not wear sandals. If you use a stepladder: Make sure that it is fully opened. Do not climb a closed stepladder. Make sure that both sides of the stepladder are locked into place. Ask someone to hold it for you, if possible. Clearly mark and make sure that you can see: Any grab bars or handrails. First and last  steps. Where the edge of each step is. Use tools that help you move around (mobility aids) if they are needed. These include: Canes. Walkers. Scooters. Crutches. Turn on the lights when you go into a dark area. Replace any light bulbs as soon as they burn out. Set up your furniture so you have a clear path. Avoid moving your furniture around. If any of your floors are uneven, fix them. If there are any pets around you, be aware of where they are. Review your medicines with your doctor. Some medicines can make you feel dizzy. This can increase your chance of falling. Ask your doctor what other things that you can do to help prevent falls. This information is not intended to replace advice given to you by your health care provider. Make sure you discuss any questions you have with your health care provider. Document Released: 07/05/2009 Document Revised: 02/14/2016 Document Reviewed: 10/13/2014 Elsevier Interactive Patient Education  2017 [employer].

## 2021-05-03 NOTE — Progress Notes (Addendum)
Virtual Visit via Telephone Note  I connected with  Harold Leach on 05/03/21 at 11:45 AM EDT by telephone and verified that I am speaking with the correct person using two identifiers.  Medicare Annual Wellness visit completed telephonically due to Covid-19 pandemic.   Persons participating in this call: This Health Coach and this patient.   Location: Patient: Home Provider: Office   I discussed the limitations, risks, security and privacy concerns of performing an evaluation and management service by telephone and the availability of in person appointments. The patient expressed understanding and agreed to proceed.  Unable to perform video visit due to video visit attempted and failed and/or patient does not have video capability.   Some vital signs may be absent or patient reported.   Willette Brace, LPN  Subjective:   Harold Leach is a 80 y.o. male who presents for Medicare Annual/Subsequent preventive examination.  Review of Systems     Cardiac Risk Factors include: advanced age (>14mn, >>50women);dyslipidemia     Objective:    There were no vitals filed for this visit. There is no height or weight on file to calculate BMI.  Advanced Directives 05/03/2021 04/26/2019 01/21/2018 06/15/2017 01/02/2017 11/13/2015 11/12/2015  Does Patient Have a Medical Advance Directive? Yes Yes Yes Yes Yes Yes No  Type of AParamedicof AGrand CaneLiving will - HGoldenLiving will Living will;Healthcare Power of ADelhiLiving will -  Copy of H[place]pher[place] Chart? Yes - validated most recent copy scanned in chart (See row information) No - copy requested - - No - copy requested - -  Would patient like information on creating a medical advance directive? - - - - - - No - patient declined information    Current Medications (verified) Outpatient Encounter Medications  as of 05/03/2021  Medication Sig   Apoaequorin 10 MG CAPS Take 1 capsule by mouth daily.   Cholecalciferol (D3-1000 PO) Take by mouth. Every other day   CINNAMON PO Take 1,200 mg by mouth.   clobetasol cream (TEMOVATE) 0.05 % Apply topically 2 (two) times daily.   Ginkgo Biloba (GINKOBA PO) 60 mg. Take two tablets twice daily   KRILL OIL PO Take 1 capsule by mouth daily.   Melatonin 10 MG TABS Take by mouth.   Multiple Vitamins-Minerals (ICAPS AREDS FORMULA PO) Take 2 capsules by mouth daily.    pantoprazole (PROTONIX) 40 MG tablet TAKE 1 TABLET(40 MG) BY MOUTH DAILY   Red Yeast Rice 600 MG CAPS Take 600 mg by mouth daily.    traZODone (DESYREL) 150 MG tablet TAKE 1 TABLET(150 MG) BY MOUTH AT BEDTIME AS NEEDED FOR SLEEP   Turmeric (QC TUMERIC COMPLEX PO) Take 1,000 mg by mouth.   vitamin B-12 (CYANOCOBALAMIN) 500 MCG tablet Take 1,000 mcg by mouth daily.    Zinc 50 MG CAPS Take by mouth.   OVER THE COUNTER MEDICATION Take 750 mg by mouth daily. GABA (Patient not taking: Reported on 05/03/2021)   [DISCONTINUED] magnesium gluconate (MAGONATE) 30 MG tablet Take 400 mg by mouth daily. (Patient not taking: Reported on 05/03/2021)   No facility-administered encounter medications on file as of 05/03/2021.    Allergies (verified) Patient has no known allergies.   History: Past Medical History:  Diagnosis Date   Acne rosacea 01/02/2017   Arthralgia of left shoulder region 01/02/2017   Chronic insomnia 01/02/2017   Chronic midline low back pain without  sciatica 01/02/2017   Gastroesophageal reflux disease without esophagitis 01/02/2017   Hyperlipidemia 11/28/2015   Macular degeneration 01/02/2017   Prostate cancer [facility])    Past Surgical History:  Procedure Laterality Date   HERNIA REPAIR     PROSTATE SURGERY  2012   Family History  Problem Relation Age of Onset   Heart disease Mother        CHF   Hypertension Mother    Prostate cancer Father    Heart disease Maternal Grandmother    Stroke  Maternal Grandmother    Social History   Socioeconomic History   Marital status: Married    Spouse name: Not on file   Number of children: Not on file   Years of education: Not on file   Highest education level: Not on file  Occupational History   Occupation: Retired  Tobacco Use   Smoking status: Former    Types: Pipe    Quit date: 03/20/1981    Years since quitting: 40.1   Smokeless tobacco: Never   Tobacco comments:    Quit in 1983 pipe smoker   Substance and Sexual Activity   Alcohol use: No   Drug use: No   Sexual activity: Yes    Partners: Female  Other Topics Concern   Not on file  Social History Narrative   Not on file   Social Determinants of Health   Financial Resource Strain: Low Risk    Difficulty of Paying Living Expenses: Not hard at all  Food Insecurity: No Food Insecurity   Worried About Charity fundraiser in the Last Year: Never true   [place] in the Last Year: Never true  Transportation Needs: No Transportation Needs   Lack of Transportation (Medical): No   Lack of Transportation (Non-Medical): No  Physical Activity: Insufficiently Active   Days of Exercise per Week: 7 days   Minutes of Exercise per Session: 20 min  Stress: No Stress Concern Present   Feeling of Stress : Not at all  Social Connections: Moderately Integrated   Frequency of Communication with Friends and Family: Once a week   Frequency of Social Gatherings with Friends and Family: More than three times a week   Attends Religious Services: Never   Marine scientist or Organizations: Yes   Attends Music therapist: 1 to 4 times per year   Marital Status: Married    Tobacco Counseling Counseling given: Not Answered Tobacco comments: Quit in 1983 pipe smoker    Clinical Intake:  Pre-visit preparation completed: Yes  Pain : No/denies pain     BMI - recorded: 22.21 Nutritional Status: BMI of 19-24  Normal Diabetes: No  How often do you need to  have someone help you when you read instructions, pamphlets, or other written materials from your doctor or pharmacy?: 1 - Never  Diabetic?No  Interpreter Needed?: No  Information entered by :: Charlott Rakes, LPN   Activities of Daily Living In your present state of health, do you have any difficulty performing the following activities: 05/03/2021  Hearing? Y  Comment weras hearing  Vision? N  Difficulty concentrating or making decisions? N  Walking or climbing stairs? N  Dressing or bathing? N  Doing errands, shopping? N  Preparing Food and eating ? N  Using the Toilet? Y  Comment at time and wears a pad  In the past six months, have you accidently leaked urine? N  Do you have problems with loss of  bowel control? N  Managing your Medications? N  Managing your Finances? N  Housekeeping or managing your Housekeeping? N  Some recent data might be hidden    Patient Care Team: Inda Coke, [place] as PCP - General (Physician Assistant) Raynelle Bring, MD as Consulting Physician (Urology)  Indicate any recent Medical Services you may have received from other than [facility] providers in the past year (date may be approximate).     Assessment:   This is a routine wellness examination for Harold Leach.  Hearing/Vision screen Hearing Screening - Comments:: Pt wears hearing aids at times  Vision Screening - Comments:: Pt follows up with Dr Kathrin Penner office for annual eye exams   Dietary issues and exercise activities discussed: Current Exercise Habits: Home exercise routine, Type of exercise: walking, Time (Minutes): 20, Frequency (Times/Week): 7, Weekly Exercise (Minutes/Week): 140   Goals Addressed             This Visit's Progress    Patient Stated       Keep living        Depression Screen PHQ 2/9 Scores 05/03/2021 07/23/2020 04/26/2020 07/08/2019 04/26/2019 01/21/2018 01/02/2017  PHQ - 2 Score 0 0 0 0 0 0 0  PHQ- 9 Score - - - 2 - - -    Fall Risk Fall Risk  05/03/2021  04/26/2020 04/26/2019 01/21/2018 01/02/2017  Falls in the past year? 1 1 0 Yes No  Comment - - - feet tangled up in shop -  Number falls in past yr: 1 1 0 1 -  Injury with Fall? 0 0 0 - -  Risk for fall due to : Impaired vision Impaired vision History of fall(s) - -  Follow up Falls prevention discussed Falls prevention discussed Falls prevention discussed Education provided -    FALL RISK PREVENTION PERTAINING TO THE HOME:  Any stairs in or around the home? Yes  If so, are there any without handrails? No  Home free of loose throw rugs in walkways, pet beds, electrical cords, etc? Yes  Adequate lighting in your home to reduce risk of falls? Yes   ASSISTIVE DEVICES UTILIZED TO PREVENT FALLS:  Life alert? No  Use of a cane, walker or w/c? No  Grab bars in the bathroom? No  Shower chair or bench in shower? No  Elevated toilet seat or a handicapped toilet? No   TIMED UP AND GO:  Was the test performed? No .  Cognitive Function: MMSE - Mini Mental State Exam 01/02/2017  Orientation to time 5  Orientation to Place 5  Registration 3  Attention/ Calculation 5  Recall 3  Language- name 2 objects 2  Language- repeat 1  Language- follow 3 step command 3  Language- read & follow direction 1  Write a sentence 1  Copy design 1  Total score 30     6CIT Screen 05/03/2021 04/26/2020 04/26/2019 01/21/2018  What Year? 0 points 0 points 0 points 0 points  What month? 0 points 0 points 0 points 0 points  What time? 0 points - 0 points 0 points  Count back from 20 0 points 0 points 0 points 0 points  Months in reverse 0 points 0 points 0 points 0 points  Repeat phrase 0 points 0 points 0 points 0 points  Total Score 0 - 0 0    Immunizations Immunization History  Administered Date(s) Administered   Influenza Whole 06/29/2017   Influenza, High Dose Seasonal PF 08/21/2016, 07/07/2018, 06/06/2019, 06/13/2020   Influenza-Unspecified 08/21/2016  Moderna Sars-Covid-2 Vaccination 10/04/2019,  11/04/2019, 06/27/2020   Pneumococcal Conjugate-13 10/10/2015   Tdap 09/12/2016   Zoster Recombinat (Shingrix) 06/06/2019, 12/18/2019    TDAP status: Up to date  Flu Vaccine status: Due, Education has been provided regarding the importance of this vaccine. Advised may receive this vaccine at local pharmacy or Health Dept. Aware to provide a copy of the vaccination record if obtained from local pharmacy or Health Dept. Verbalized acceptance and understanding.  Pneumococcal vaccine status: Completed during today's visit.  Covid-19 vaccine status: Completed vaccines  Qualifies for Shingles Vaccine? Yes   Zostavax completed No   Shingrix Completed?: Yes  Screening Tests Health Maintenance  Topic Date Due   COVID-19 Vaccine (4 - Booster for Moderna series) 09/27/2020   INFLUENZA VACCINE  04/22/2021   TETANUS/TDAP  09/12/2026   Hepatitis C Screening  Completed   PNA vac Low Risk Adult  Completed   Zoster Vaccines- Shingrix  Completed   HPV VACCINES  Aged Out    Health Maintenance  Health Maintenance Due  Topic Date Due   COVID-19 Vaccine (4 - Booster for Moderna series) 09/27/2020   INFLUENZA VACCINE  04/22/2021    Colorectal cancer screening: No longer required.   Additional Screening:  Hepatitis C Screening:  Completed 11/12/15  Vision Screening: Recommended annual ophthalmology exams for early detection of glaucoma and other disorders of the eye. Is the patient up to date with their annual eye exam?  Yes  Who is the provider or what is the name of the office in which the patient attends annual eye exams? Dr Huston Foley office If pt is not established with a provider, would they like to be referred to a provider to establish care? No .   Dental Screening: Recommended annual dental exams for proper oral hygiene  Community Resource Referral / Chronic Care Management: CRR required this visit?  No   CCM required this visit?  No      Plan:     I have personally  reviewed and noted the following in the patient's chart:   Medical and social history Use of alcohol, tobacco or illicit drugs  Current medications and supplements including opioid prescriptions. Patient is not currently taking opioid prescriptions. Functional ability and status Nutritional status Physical activity Advanced directives List of other physicians Hospitalizations, surgeries, and ER visits in previous 12 months Vitals Screenings to include cognitive, depression, and falls Referrals and appointments  In addition, I have reviewed and discussed with patient certain preventive protocols, quality metrics, and best practice recommendations. A written personalized care plan for preventive services as well as general preventive health recommendations were provided to patient.     Willette Brace, LPN   [id number]   Nurse Notes: Pt wants to address right ankle and foot pain he has every morning when he awakens, along with cramps in arms at times. He declined an earlier appt. He  would like to follow up with you on 07/24/21.

## 2021-06-04 DIAGNOSIS — Z23 Encounter for immunization: Secondary | ICD-10-CM | POA: Diagnosis not present

## 2021-07-10 DIAGNOSIS — Z23 Encounter for immunization: Secondary | ICD-10-CM | POA: Diagnosis not present

## 2021-07-23 NOTE — Progress Notes (Signed)
Subjective:    Harold Leach is a 80 y.o. male and is here for follow-up   HPI  There are no preventive care reminders to display for this patient.  Acute Concerns: Past Ankle Injury A couple of years back, Harold Leach fell off a building and landed on his right ankle. At the time he was treated and notified that it may never fully heal by Dr. Briscoe Deutscher. Currently he reports he has been experiencing pain in his right ankle upon waking up in the morning or moving around after being stationary for an extended amount of time. He is interested in a referral to sports medicine to get the bigger picture of what could be going on.   Muscle Cramps Harold Leach says that he was having muscle cramps in his right arm and both legs following him stopping the aspirin 81 mg. Due to noticing the correlation between the aspirin and muscle cramps he began to take the aspirin again. He says the right arm pain had resolved itself shortly after and about 75% of his leg cramps had been resolved as well.   Since then he has been placing a dove bar of soap between his sheets and mattress to treat his leg cramps due to a suggestion from a friend. He was surprised to find that it has been extremely helpful as well as theraworx foam, but he does occasionally have leg cramps and numbness on the bottom of his feet.  Denies swelling in his leg.  Chronic Issues: Insomnia Currently compliant with taking trazodone 150 mg with no adverse effects. Harold Leach states the trazodone has not been helping him stay asleep. He has tried steadily increasing his dosage to 225 mg which provided no relief. Within the past three weeks, he began taking 300 mg of trazodone every night and was still not able to achieve quality sleep throughout the night.   Harold Leach states Harold Leach admits this is a problem his sister and mother have as well. Denies concerns for sleep apnea.   HLD Currently not on any medications but we will recheck  today.  Health Maintenance: Immunizations -- COVID- Last completed 06/27/20 (Moderna- 3 doses) Influenza Vaccine- Last completed 06/13/20 Tdap- Last completed 09/12/16 Colonoscopy -- Not completed PSA --  Lab Results  Component Value Date   PSA <0.04 07/23/2020   PSA 0.00 Repeated and verified X2. (L) 07/08/2019   PSA <0.015ng/ml 04/09/2018   Sleep habits -- Not sleeping through the night or achieving quality sleep Weight -- Weight increase but stable Recent weight history Wt Readings from Last 10 Encounters:  07/24/21 164 lb (74.4 kg)  07/23/20 152 lb 8 oz (69.2 kg)  04/04/20 153 lb (69.4 kg)  03/28/20 152 lb (68.9 kg)  03/21/20 153 lb (69.4 kg)  11/29/19 154 lb 8 oz (70.1 kg)  09/21/19 159 lb 4 oz (72.2 kg)  07/08/19 148 lb (67.1 kg)  10/11/18 172 lb 4 oz (78.1 kg)  07/07/18 167 lb 9.6 oz (76 kg)   Body mass index is 23.87 kg/m. Mood -- Stable Alcohol use --  reports no history of alcohol use.  Tobacco use --  Tobacco Use: Medium Risk   Smoking Tobacco Use: Former   Smokeless Tobacco Use: Never   Passive Exposure: Not on file     Depression screen [facility] 2/9 07/24/2021  Decreased Interest 0  Down, Depressed, Hopeless 0  PHQ - 2 Score 0  Altered sleeping -  Tired, decreased energy -  Change in appetite -  Feeling bad or failure about yourself  -  Trouble concentrating -  Moving slowly or fidgety/restless -  Suicidal thoughts -  PHQ-9 Score -  Difficult doing work/chores -    Other providers/specialists: Patient Care Team: Inda Coke, [place] as PCP - General (Physician Assistant) Raynelle Bring, MD as Consulting Physician (Urology)    PMHx, SurgHx, SocialHx, Medications, and Allergies were reviewed in the Visit Navigator and updated as appropriate.   Past Medical History:  Diagnosis Date   Acne rosacea 01/02/2017   Arthralgia of left shoulder region 01/02/2017   Chronic insomnia 01/02/2017   Chronic midline low back pain without sciatica 01/02/2017    Gastroesophageal reflux disease without esophagitis 01/02/2017   Hyperlipidemia 11/28/2015   Macular degeneration 01/02/2017   Prostate cancer [facility])      Past Surgical History:  Procedure Laterality Date   HERNIA REPAIR     PROSTATE SURGERY  2012     Family History  Problem Relation Age of Onset   Heart disease Mother        CHF   Hypertension Mother    Prostate cancer Father    Heart disease Maternal Grandmother    Stroke Maternal Grandmother     Social History   Tobacco Use   Smoking status: Former    Types: Pipe    Quit date: 03/20/1981    Years since quitting: 40.3   Smokeless tobacco: Never   Tobacco comments:    Quit in 1983 pipe smoker   Substance Use Topics   Alcohol use: No   Drug use: No    Review of Systems:   Review of Systems  Constitutional:  Negative for chills, fever, malaise/fatigue and weight loss.  HENT:  Negative for hearing loss, sinus pain and sore throat.   Respiratory:  Negative for cough and hemoptysis.   Cardiovascular:  Negative for chest pain, palpitations, leg swelling and PND.  Gastrointestinal:  Negative for abdominal pain, constipation, diarrhea, heartburn, nausea and vomiting.  Genitourinary:  Negative for dysuria, frequency and urgency.  Musculoskeletal:  Negative for back pain, myalgias and neck pain.  Skin:  Negative for itching and rash.  Neurological:  Negative for dizziness, tingling, seizures and headaches.  Endo/Heme/Allergies:  Negative for polydipsia.  Psychiatric/Behavioral:  Negative for depression. The patient is not nervous/anxious.    Objective:    Vitals:   07/24/21 0835  BP: 110/70  Pulse: (!) 49  Temp: 98 F (36.7 C)  SpO2: 97%   Body mass index is 23.87 kg/m.  General  Alert, cooperative, no distress, appears stated age  Head:  Normocephalic, without obvious abnormality, atraumatic  Eyes:  PERRL, conjunctiva/corneas clear, EOM's intact, fundi benign, both eyes       Ears:  Normal TM's and external  ear canals, both ears  Nose: Nares normal, septum midline, mucosa normal, no drainage or sinus tenderness  Throat: Lips, mucosa, and tongue normal; teeth and gums normal  Neck: Supple, symmetrical, trachea midline, no adenopathy;     thyroid:  No enlargement/tenderness/nodules; no carotid bruit or JVD  Back:   Symmetric, no curvature, ROM normal, no CVA tenderness  Lungs:   Clear to auscultation bilaterally, respirations unlabored  Chest wall:  No tenderness or deformity  Heart:  Regular rate and rhythm, S1 and S2 normal, no murmur, rub or gallop  Abdomen:   Soft, non-tender, bowel sounds active all four quadrants, no masses, no organomegaly  Extremities: Extremities normal, atraumatic, no cyanosis or edema No significant TTP of R ankle; normal  ROM  Prostate : None   Skin: Skin color, texture, turgor normal, no rashes or lesions  Lymph nodes: Cervical, supraclavicular, and axillary nodes normal  Neurologic: CNII-XII grossly intact. Normal strength, sensation and reflexes throughout   AssessmentPlan:   Chronic insomnia Uncontrolled Advised patient as follows Decrease your trazodone to 150 mg x 2 week Then decrease your trazodone to 75 mg x 2 weeks Then decrease your trazodone to 50 mg and add 7.5 mg remeron Follow-up with me in ONE MONTH after starting the 50 mg trazodone + 7.5 mg remeron regimen   Chronic pain of right ankle Referral to sports medicine for further evaluation   Pure hypercholesterolemia Update cholesterol panel today and provide recommendations accordingly   Leg cramps Update labs to check electrolytes as well as iron panel Patient is requesting ultrasound today for evaluation of blood flow, I do not think that this is unreasonable I have ordered this today. Consider referral to vascular if needed.    I,Havlyn C Ratchford,acting as a Education administrator for [employer], PA.,have documented all relevant documentation on the behalf of Inda Coke, PA,as directed by   Inda Coke, PA while in the presence of Inda Coke, [place].  I, Inda Coke, [place], have reviewed all documentation for this visit. The documentation on 07/24/21 for the exam, diagnosis, procedures, and orders are all accurate and complete.   Inda Coke, PA-C [place]

## 2021-07-24 ENCOUNTER — Other Ambulatory Visit: Payer: Self-pay

## 2021-07-24 ENCOUNTER — Telehealth: Payer: Self-pay

## 2021-07-24 ENCOUNTER — Encounter: Payer: Self-pay | Admitting: Physician Assistant

## 2021-07-24 ENCOUNTER — Ambulatory Visit (HOSPITAL_COMMUNITY)
Admission: RE | Admit: 2021-07-24 | Discharge: 2021-07-24 | Disposition: A | Discharge status: Home / Self Care | Payer: Medicare Other | Source: Ambulatory Visit | Attending: Physician Assistant | Admitting: Physician Assistant

## 2021-07-24 ENCOUNTER — Ambulatory Visit (INDEPENDENT_AMBULATORY_CARE_PROVIDER_SITE_OTHER): Payer: Medicare Other | Admitting: Physician Assistant

## 2021-07-24 VITALS — BP 110/70 | HR 49 | Temp 98.0°F | Ht 69.5 in | Wt 164.0 lb

## 2021-07-24 DIAGNOSIS — M25571 Pain in right ankle and joints of right foot: Secondary | ICD-10-CM

## 2021-07-24 DIAGNOSIS — E7800 Pure hypercholesterolemia, unspecified: Secondary | ICD-10-CM

## 2021-07-24 DIAGNOSIS — G8929 Other chronic pain: Secondary | ICD-10-CM | POA: Diagnosis not present

## 2021-07-24 DIAGNOSIS — R252 Cramp and spasm: Secondary | ICD-10-CM

## 2021-07-24 DIAGNOSIS — F5104 Psychophysiologic insomnia: Secondary | ICD-10-CM

## 2021-07-24 LAB — CBC WITH DIFFERENTIAL/PLATELET
Basophils Absolute: 0.1 10*3/uL (ref 0.0–0.1)
Basophils Relative: 1.1 % (ref 0.0–3.0)
Eosinophils Absolute: 0.1 10*3/uL (ref 0.0–0.7)
Eosinophils Relative: 1.8 % (ref 0.0–5.0)
HCT: 40.7 % (ref 39.0–52.0)
Hemoglobin: 13.6 g/dL (ref 13.0–17.0)
Lymphocytes Relative: 20 % (ref 12.0–46.0)
Lymphs Abs: 1 10*3/uL (ref 0.7–4.0)
MCHC: 33.5 g/dL (ref 30.0–36.0)
MCV: 89.5 fl (ref 78.0–100.0)
Monocytes Absolute: 0.5 10*3/uL (ref 0.1–1.0)
Monocytes Relative: 10.4 % (ref 3.0–12.0)
Neutro Abs: 3.5 10*3/uL (ref 1.4–7.7)
Neutrophils Relative %: 66.7 % (ref 43.0–77.0)
Platelets: 146 10*3/uL — ABNORMAL LOW (ref 150.0–400.0)
RBC: 4.54 Mil/uL (ref 4.22–5.81)
RDW: 14.1 % (ref 11.5–15.5)
WBC: 5.2 10*3/uL (ref 4.0–10.5)

## 2021-07-24 LAB — LIPID PANEL
Cholesterol: 181 mg/dL (ref 0–200)
HDL: 55.8 mg/dL (ref >39.00)
LDL Cholesterol: 107 mg/dL — ABNORMAL HIGH (ref 0–99)
NonHDL: 125.07
Total CHOL/HDL Ratio: 3
Triglycerides: 89 mg/dL (ref 0.0–149.0)
VLDL: 17.8 mg/dL (ref 0.0–40.0)

## 2021-07-24 LAB — COMPREHENSIVE METABOLIC PANEL
ALT: 16 U/L (ref 0–53)
AST: 23 U/L (ref 0–37)
Albumin: 4.3 g/dL (ref 3.5–5.2)
Alkaline Phosphatase: 47 U/L (ref 39–117)
BUN: 24 mg/dL — ABNORMAL HIGH (ref 6–23)
CO2: 32 mEq/L (ref 19–32)
Calcium: 9.2 mg/dL (ref 8.4–10.5)
Chloride: 104 mEq/L (ref 96–112)
Creatinine, Ser: 1.34 mg/dL (ref 0.40–1.50)
GFR: 50.93 mL/min — ABNORMAL LOW (ref >60.00)
Glucose, Bld: 91 mg/dL (ref 70–99)
Potassium: 5.2 mEq/L — ABNORMAL HIGH (ref 3.5–5.1)
Sodium: 141 mEq/L (ref 135–145)
Total Bilirubin: 0.5 mg/dL (ref 0.2–1.2)
Total Protein: 6.6 g/dL (ref 6.0–8.3)

## 2021-07-24 MED ORDER — MIRTAZAPINE 7.5 MG PO TABS: 7.5000 mg | ORAL_TABLET | Freq: Every day | ORAL | 0 refills | Status: DC

## 2021-07-24 MED ORDER — TRAZODONE HCL 50 MG PO TABS: 50.0000 mg | ORAL_TABLET | Freq: Every day | ORAL | 0 refills | Status: DC

## 2021-07-24 NOTE — Telephone Encounter (Signed)
Spoke with Megan from vein and vascular. 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

## 2021-07-24 NOTE — Patient Instructions (Addendum)
It was great to see you!  For your insomnia: Decrease your trazodone to 150 mg x 2 week Then decrease your trazodone to 75 mg x 2 weeks Then decrease your trazodone to 50 mg and ad 7.5 mg remeron Follow-up with me in ONE MONTH after starting the 50 mg trazodone + 7.5 mg remeron regimen  For your feet/ankle: We will put in referral to sports medicine  For your leg: We will put in referral to have imaging of the blood flow of your leg   Please go to the lab for blood work.   Our office will call you with your results unless you have chosen to receive results via MyChart.  If your blood work is normal we will follow-up each year for physicals and as scheduled for chronic medical problems.  If anything is abnormal we will treat accordingly and get you in for a follow-up.  Take care,  Aldona Bar

## 2021-07-25 ENCOUNTER — Other Ambulatory Visit: Payer: Self-pay | Admitting: Physician Assistant

## 2021-07-25 ENCOUNTER — Telehealth: Payer: Self-pay

## 2021-07-25 DIAGNOSIS — E875 Hyperkalemia: Secondary | ICD-10-CM

## 2021-07-25 DIAGNOSIS — R710 Precipitous drop in hematocrit: Secondary | ICD-10-CM

## 2021-07-25 DIAGNOSIS — I82890 Acute embolism and thrombosis of other specified veins: Secondary | ICD-10-CM

## 2021-07-25 LAB — IRON,TIBC AND FERRITIN PANEL
%SAT: 26 % (calc) (ref 20–48)
Ferritin: 18 ng/mL — ABNORMAL LOW (ref 24–380)
Iron: 101 ug/dL (ref 50–180)
TIBC: 383 mcg/dL (calc) (ref 250–425)

## 2021-07-25 NOTE — Telephone Encounter (Signed)
Pt was calling back for lab results. Please Advise.

## 2021-07-25 NOTE — Progress Notes (Signed)
    Subjective:    CC: R ankle pain  I, Molly Weber, LAT, ATC, am serving as scribe for Dr. Lynne Leader.  HPI: Pt is a 80 y/o male presenting w/ chronic R ankle pain after falling off of a building and landing on his R ankle a few years ago.  Pt notes his R ankle started hurting again about 6 months ago. He locates his pain to all over R ankle w/ slight numbness on the plantar aspect.  R ankle swelling: no R ankle mechanical symptoms: yes Aggravating factors: initial weight-bearing in the morning upon getting out of bed or after standing after prolonged sitting/resting;  Treatments tried: none  Diagnostic imaging: R ankle- 04/07/17  Pertinent review of Systems: No fevers or chills  Relevant historical information: GERD.  Hyperlipidemia. Patient restores old [place] cars and trucks as a hobby.   Objective:    Vitals:   07/26/21 0913  BP: (!) 148/90  Pulse: (!) 53  SpO2: 98%   General: Well Developed, well nourished, and in no acute distress.   MSK: Right ankle normal-appearing no effusion.  Mildly tender palpation anterior lateral ankle. Normal ankle motion. Stable ligamentous exam. Intact strength.  Lab and Radiology Results  X-ray images right ankle obtained today personally and independently interpreted X-rays compared to ankle x-ray dated 2018 after his original injury showing no significant DJD or fracture. Minimal degenerative changes.  No acute fractures. Await formal radiology review    Impression and Recommendations:    Assessment and Plan: 80 y.o. male with right ankle pain and soreness.  Patient had a high impact fall about 4 years ago now.  There is no severe or obvious injury on the original x-ray or on today's x-ray per my read.  As possibly he has a more significant chondral injury that would be visible on MRI.  However his symptoms are not severe enough at this time to warrant significant work-up.  Plan for simple pragmatic conservative  treatment options including home exercise program, Voltaren gel, and compressive ankle sleeve.  Check back in about 6 weeks.  Could consider injection or even MRI at that point if needed.  PDMP not reviewed this encounter. Orders Placed This Encounter  Procedures   DG Ankle Complete Right    Standing Status:   Future    Number of Occurrences:   1    Standing Expiration Date:   08/24/2021    Order Specific Question:   Reason for Exam (SYMPTOM  OR DIAGNOSIS REQUIRED)    Answer:   R ankle pain    Order Specific Question:   Preferred imaging location?    Answer:   [facility]   No orders of the defined types were placed in this encounter.   Discussed warning signs or symptoms. Please see discharge instructions. Patient expresses understanding.   The above documentation has been reviewed and is accurate and complete Lynne Leader, M.D.

## 2021-07-26 ENCOUNTER — Other Ambulatory Visit: Payer: Self-pay

## 2021-07-26 ENCOUNTER — Ambulatory Visit (INDEPENDENT_AMBULATORY_CARE_PROVIDER_SITE_OTHER): Payer: Medicare Other

## 2021-07-26 ENCOUNTER — Ambulatory Visit: Payer: Self-pay

## 2021-07-26 ENCOUNTER — Ambulatory Visit (INDEPENDENT_AMBULATORY_CARE_PROVIDER_SITE_OTHER): Payer: Medicare Other | Admitting: Family Medicine

## 2021-07-26 VITALS — BP 148/90 | HR 53 | Ht 69.5 in | Wt 164.2 lb

## 2021-07-26 DIAGNOSIS — M25571 Pain in right ankle and joints of right foot: Secondary | ICD-10-CM | POA: Diagnosis not present

## 2021-07-26 DIAGNOSIS — G8929 Other chronic pain: Secondary | ICD-10-CM

## 2021-07-26 NOTE — Telephone Encounter (Signed)
See result notes. 

## 2021-07-26 NOTE — Patient Instructions (Addendum)
Thank you for coming in today.   Please use Voltaren gel (Generic Diclofenac Gel) up to 4x daily for pain as needed.  This is available over-the-counter as both the name brand Voltaren gel and the generic diclofenac gel.   Please get an Xray today before you leave   I recommend you obtained a compression sleeve to help with your joint problems. There are many options on the market however I recommend obtaining a full ankle Body Helix compression sleeve.  You can find information (including how to appropriate measure yourself for sizing) can be found at www.Body http://www.lambert.com/.  Many of these products are health savings account (HSA) eligible.  You can use the compression sleeve at any time throughout the day but is most important to use while being active as well as for 2 hours post-activity.   It is appropriate to ice following activity with the compression sleeve in place.   Recheck back as needed

## 2021-07-27 NOTE — Progress Notes (Signed)
Right ankle x-ray looks normal to radiology.  If needed an MRI would be helpful in the future.

## 2021-08-01 ENCOUNTER — Telehealth: Payer: Self-pay

## 2021-08-01 ENCOUNTER — Other Ambulatory Visit: Payer: Medicare Other

## 2021-08-01 NOTE — Telephone Encounter (Signed)
Patient is calling in stating that he forgot to ask if he can get a PSA added to the blood work. Advised Aldona Bar was out today and would send a message.

## 2021-08-02 ENCOUNTER — Other Ambulatory Visit (INDEPENDENT_AMBULATORY_CARE_PROVIDER_SITE_OTHER): Payer: Medicare Other

## 2021-08-02 ENCOUNTER — Other Ambulatory Visit: Payer: Self-pay | Admitting: Physician Assistant

## 2021-08-02 DIAGNOSIS — Z8546 Personal history of malignant neoplasm of prostate: Secondary | ICD-10-CM | POA: Diagnosis not present

## 2021-08-02 DIAGNOSIS — E875 Hyperkalemia: Secondary | ICD-10-CM | POA: Diagnosis not present

## 2021-08-02 DIAGNOSIS — R710 Precipitous drop in hematocrit: Secondary | ICD-10-CM

## 2021-08-02 LAB — BASIC METABOLIC PANEL
BUN: 31 mg/dL — ABNORMAL HIGH (ref 6–23)
CO2: 29 mEq/L (ref 19–32)
Calcium: 9.2 mg/dL (ref 8.4–10.5)
Chloride: 102 mEq/L (ref 96–112)
Creatinine, Ser: 1.36 mg/dL (ref 0.40–1.50)
GFR: 50.03 mL/min — ABNORMAL LOW (ref >60.00)
Glucose, Bld: 122 mg/dL — ABNORMAL HIGH (ref 70–99)
Potassium: 4.5 mEq/L (ref 3.5–5.1)
Sodium: 139 mEq/L (ref 135–145)

## 2021-08-02 LAB — PSA: PSA: 0 ng/mL — ABNORMAL LOW (ref 0.10–4.00)

## 2021-08-02 LAB — CBC WITH DIFFERENTIAL/PLATELET
Basophils Absolute: 0.1 10*3/uL (ref 0.0–0.1)
Basophils Relative: 1.1 % (ref 0.0–3.0)
Eosinophils Absolute: 0.1 10*3/uL (ref 0.0–0.7)
Eosinophils Relative: 1.2 % (ref 0.0–5.0)
HCT: 39.6 % (ref 39.0–52.0)
Hemoglobin: 13.5 g/dL (ref 13.0–17.0)
Lymphocytes Relative: 24.3 % (ref 12.0–46.0)
Lymphs Abs: 1.6 10*3/uL (ref 0.7–4.0)
MCHC: 34.1 g/dL (ref 30.0–36.0)
MCV: 88.7 fl (ref 78.0–100.0)
Monocytes Absolute: 0.6 10*3/uL (ref 0.1–1.0)
Monocytes Relative: 8.7 % (ref 3.0–12.0)
Neutro Abs: 4.1 10*3/uL (ref 1.4–7.7)
Neutrophils Relative %: 64.7 % (ref 43.0–77.0)
Platelets: 172 10*3/uL (ref 150.0–400.0)
RBC: 4.46 Mil/uL (ref 4.22–5.81)
RDW: 14.2 % (ref 11.5–15.5)
WBC: 6.4 10*3/uL (ref 4.0–10.5)

## 2021-08-02 NOTE — Telephone Encounter (Signed)
Please see message and advise 

## 2021-08-02 NOTE — Telephone Encounter (Signed)
Spoke to pt told him Aldona Bar said unfortunately it is too late to be added to his prior blood draw. She has placed the order and you can come at your convenience. Pt verbalized understanding and said he is on his way now to the office. He said labs were not done yesterday cause they told him to wait and come today. Told him okay orders are in. Pt verbalized understanding.

## 2021-08-03 ENCOUNTER — Other Ambulatory Visit: Payer: Self-pay

## 2021-08-03 DIAGNOSIS — I8000 Phlebitis and thrombophlebitis of superficial vessels of unspecified lower extremity: Secondary | ICD-10-CM

## 2021-08-28 DIAGNOSIS — Z1211 Encounter for screening for malignant neoplasm of colon: Secondary | ICD-10-CM | POA: Insufficient documentation

## 2021-08-28 DIAGNOSIS — R1310 Dysphagia, unspecified: Secondary | ICD-10-CM | POA: Insufficient documentation

## 2021-08-29 ENCOUNTER — Other Ambulatory Visit: Payer: Self-pay

## 2021-08-29 ENCOUNTER — Ambulatory Visit (HOSPITAL_COMMUNITY)
Admission: RE | Admit: 2021-08-29 | Discharge: 2021-08-29 | Disposition: A | Discharge status: Home / Self Care | Payer: Medicare Other | Source: Ambulatory Visit | Attending: Physician Assistant | Admitting: Physician Assistant

## 2021-08-29 ENCOUNTER — Ambulatory Visit (INDEPENDENT_AMBULATORY_CARE_PROVIDER_SITE_OTHER): Payer: Medicare Other | Admitting: Physician Assistant

## 2021-08-29 VITALS — BP 122/79 | HR 77 | Temp 97.8°F | Resp 20 | Ht 69.5 in | Wt 165.0 lb

## 2021-08-29 DIAGNOSIS — G4762 Sleep related leg cramps: Secondary | ICD-10-CM

## 2021-08-29 DIAGNOSIS — I82811 Embolism and thrombosis of superficial veins of right lower extremities: Secondary | ICD-10-CM

## 2021-08-29 DIAGNOSIS — I8000 Phlebitis and thrombophlebitis of superficial vessels of unspecified lower extremity: Secondary | ICD-10-CM

## 2021-08-29 DIAGNOSIS — I8001 Phlebitis and thrombophlebitis of superficial vessels of right lower extremity: Secondary | ICD-10-CM

## 2021-08-29 NOTE — Progress Notes (Signed)
Requested by:  Inda Coke, [address] [place],  [place] [id number]  Reason for consultation: cramping in right leg    History of Present Illness   Harold Leach is a 80 y.o. (12-25-1940) male who presents for evaluation of leg cramps in right > left leg. He says over past year he says that he has been having almost nightly leg cramps that often times were very severe. He had correlated this with stopping his 81 mg Aspirin. He took it upon himself to restart his aspirin and he now has not had cramping for 2 months. He otherwise denies any swelling, aching, heaviness, tiredness, burning, itching, bleeding or ulceration in his legs. He has no pain on ambulation or rest, non non healing wounds. He says he stays very active. He has no history of DVT. He has known superficial venous thrombosis seen on ultrasound 1 month ago. Suspect this is from an injury in his right leg from a fall several years ago.  Venous symptoms include: cramping in R leg, right upper arm Onset/duration:   Occupation:  retired Aggravating factors: none Alleviating factors: taking aspirin Compression:  never Helps:  n/a Pain medications:  none Previous vein procedures:  no History of DVT:  SVT in SSV  Past Medical History:  Diagnosis Date   Acne rosacea 01/02/2017   Arthralgia of left shoulder region 01/02/2017   Chronic insomnia 01/02/2017   Chronic midline low back pain without sciatica 01/02/2017   Gastroesophageal reflux disease without esophagitis 01/02/2017   Hyperlipidemia 11/28/2015   Macular degeneration 01/02/2017   Prostate cancer [facility])     Past Surgical History:  Procedure Laterality Date   HERNIA REPAIR     PROSTATE SURGERY  2012    Social History   Socioeconomic History   Marital status: Married    Spouse name: Not on file   Number of children: Not on file   Years of education: Not on file   Highest education level: Not on file  Occupational History   Occupation:  Retired  Tobacco Use   Smoking status: Former    Types: Pipe    Quit date: 03/20/1981    Years since quitting: 40.4   Smokeless tobacco: Never   Tobacco comments:    Quit in 1983 pipe smoker   Substance and Sexual Activity   Alcohol use: No   Drug use: No   Sexual activity: Yes    Partners: Female  Other Topics Concern   Not on file  Social History Narrative   Not on file   Social Determinants of Health   Financial Resource Strain: Low Risk    Difficulty of Paying Living Expenses: Not hard at all  Food Insecurity: No Food Insecurity   Worried About Charity fundraiser in the Last Year: Never true   Arboriculturist in the Last Year: Never true  Transportation Needs: No Transportation Needs   Lack of Transportation (Medical): No   Lack of Transportation (Non-Medical): No  Physical Activity: Insufficiently Active   Days of Exercise per Week: 7 days   Minutes of Exercise per Session: 20 min  Stress: No Stress Concern Present   Feeling of Stress : Not at all  Social Connections: Moderately Integrated   Frequency of Communication with Friends and Family: Once a week   Frequency of Social Gatherings with Friends and Family: More than three times a week   Attends Religious Services: Never   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     aspirin EC 81 MG tablet Take 81 mg by mouth daily. Swallow whole.      Cholecalciferol (D3-1000 PO) Take by mouth. Every other day     CINNAMON PO Take 1,200 mg by mouth.     clobetasol cream (TEMOVATE) 0.05 % Apply topically 2 (two) times daily. (Patient taking differently: Apply topically as needed.) 45 g 0   Ginkgo Biloba (GINKOBA PO) 60 mg. Take two tablets twice daily     Melatonin 10 MG TABS Take by mouth.     mirtazapine (REMERON) 7.5 MG tablet Take 1 tablet (7.5 mg total) by mouth at bedtime. 30 tablet 0   Multiple Vitamins-Minerals (ICAPS AREDS FORMULA PO) Take 2 capsules by mouth daily.      pantoprazole (PROTONIX) 40 MG tablet TAKE 1 TABLET(40 MG) BY MOUTH DAILY 90 tablet 3   traZODone (DESYREL) 50 MG tablet Take 1 tablet (50 mg total) by mouth at bedtime. 90 tablet 0   Turmeric (QC TUMERIC COMPLEX PO) Take 1,000 mg by mouth.     vitamin B-12 (CYANOCOBALAMIN) 500 MCG tablet Take 1,000 mcg by mouth daily.      No current facility-administered medications for this visit.    No Known Allergies  REVIEW OF SYSTEMS (negative unless checked):   Cardiac:  []  Chest pain or chest pressure? []  Shortness of breath upon activity? []  Shortness of breath when lying flat? []  Irregular heart rhythm?  Vascular:  []  Pain in calf, thigh, or hip brought on by walking? []  Pain in feet at night that wakes you up from your sleep? []  Blood clot in your veins? []  Leg swelling?  Pulmonary:  []  Oxygen at home? []  Productive cough? []  Wheezing?  Neurologic:  []  Sudden weakness in arms or legs? []  Sudden numbness in arms or legs? []  Sudden onset of difficult speaking or slurred speech? []  Temporary loss of vision in one eye? []  Problems with dizziness?  Gastrointestinal:  []  Blood in stool? []  Vomited blood?  Genitourinary:  []  Burning when urinating? []  Blood in urine?  Psychiatric:  []  Major depression  Hematologic:  []  Bleeding problems? []  Problems with blood clotting?  Dermatologic:  []  Rashes or ulcers?  Constitutional:  []  Fever or  chills?  Ear/Nose/Throat:  []  Change in hearing? []  Nose bleeds? []  Sore throat?  Musculoskeletal:  []  Back pain? []  Joint pain? []  Muscle pain?   Physical Examination     Vitals:   08/29/21 1050  BP: 122/79  Pulse: 77  Resp: 20  Temp: 97.8 F (36.6 C)  TempSrc: Temporal  SpO2: 97%  Weight: 165 lb (74.8 kg)  Height: 5' 9.5" (1.765 m)   Body mass index is 24.02 kg/m.  General:  WDWN in NAD; vital signs documented above Gait: Normal HENT:  WNL, normocephalic Pulmonary: normal non-labored breathing , without  wheezing Cardiac: regular HR, with systolic Murmur, without carotid bruit Abdomen: soft, Vascular Exam/Pulses:  Right Left  Radial 2+ (normal) 2+ (normal)  Femoral 2+ (normal) 2+ (normal)  Popliteal 2+ (normal) 2+ (normal)  DP 2+ (normal) 2+ (normal)  PT 1+ (weak) 1+ (weak)   Extremities: without varicose veins, without reticular veins, without edema, without stasis pigmentation, without lipodermatosclerosis, without ulcers Musculoskeletal: no muscle wasting or atrophy  Neurologic: A&O X 3;  No focal weakness or paresthesias are detected Psychiatric:  The pt has Normal affect.  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 male who presents with history of lower extremity cramping. He has a known chronic SSV thrombus diagnosed 1 month ago. Likely from a fall injury several years ago. On duplex today we again see the SSV thrombosis. No venous insufficiency throughout the right leg. He has no appreciable arterial occlusive disease with palpable pulses bilaterally. Im not sure the correlation of his symptom resolution with his Aspirin but recommend if he is otherwise tolerating Aspirin that he should just continue it. I discussed with patient and  his wife that if cramps return he should make sure he is properly hydrated and also evaluate his electrolyte status. Based on today's findings he would benefit from daily elevation and could consider knee high light compression stockings as needed. He can follow up with [place] as needed if he has new or concerning symptoms.Thank you for allowing [place] to participate in this patient's care.   Karoline Caldwell, PA-C [facility] Office: [phone]  08/29/2021, 10:53 AM  Clinic MD: Scot Dock

## 2021-09-17 ENCOUNTER — Telehealth: Payer: Self-pay

## 2021-09-17 NOTE — Telephone Encounter (Signed)
Patient state he will be off of trazodone in 3 days.  Wants to know if Sam is going to call in a higher dose of remeron?  Please follow up in regard.

## 2021-09-18 ENCOUNTER — Telehealth: Payer: Self-pay | Admitting: Physician Assistant

## 2021-09-18 NOTE — Telephone Encounter (Signed)
Left voice message for patient to call clinic.  

## 2021-09-18 NOTE — Telephone Encounter (Signed)
Spoke to pt told him he was suppose to follow up to discuss sleep medication. Pt verbalized understanding. Appt scheduled for tomorrow 09/19/2021 at 9:30.

## 2021-09-18 NOTE — Telephone Encounter (Signed)
Appt scheduled

## 2021-09-19 ENCOUNTER — Encounter: Payer: Self-pay | Admitting: Physician Assistant

## 2021-09-19 ENCOUNTER — Other Ambulatory Visit: Payer: Self-pay

## 2021-09-19 ENCOUNTER — Ambulatory Visit (INDEPENDENT_AMBULATORY_CARE_PROVIDER_SITE_OTHER): Payer: Medicare Other | Admitting: Physician Assistant

## 2021-09-19 VITALS — BP 90/62 | HR 56 | Temp 97.3°F | Wt 174.0 lb

## 2021-09-19 DIAGNOSIS — F5104 Psychophysiologic insomnia: Secondary | ICD-10-CM | POA: Diagnosis not present

## 2021-09-19 MED ORDER — MIRTAZAPINE 30 MG PO TBDP: 30.0000 mg | ORAL_TABLET | Freq: Every day | ORAL | 1 refills | Status: DC

## 2021-09-19 NOTE — Progress Notes (Signed)
Harold Leach is a 80 y.o. male here for f/u of insomnia.   History of Present Illness:   Chief Complaint  Patient presents with   Sleep Medication    Pt wanting to discuss sleep medication.    HPI  Chronic Insomnia Currently Harold Leach is compliant with taking remeron 7.5 mg daily and trazodone 50 mg daily with no adverse effects.  States that although he has been compliant with this regimen, he still finds himself needing two benadryl a night to fall asleep. He is interested in increasing the dosage of remeron and moving toward stopping the trazodone completely at this time.   We are continuing to work on weaning his trazodone -- was originally taking 200 mg daily.  Past Medical History:  Diagnosis Date   Acne rosacea 01/02/2017   Arthralgia of left shoulder region 01/02/2017   Chronic insomnia 01/02/2017   Chronic midline low back pain without sciatica 01/02/2017   Gastroesophageal reflux disease without esophagitis 01/02/2017   Hyperlipidemia 11/28/2015   Macular degeneration 01/02/2017   Prostate cancer ([place])      Social History   Tobacco Use   Smoking status: Former    Types: Pipe    Quit date: 03/20/1981    Years since quitting: 40.5   Smokeless tobacco: Never   Tobacco comments:    Quit in 1983 pipe smoker   Substance Use Topics   Alcohol use: No   Drug use: No    Past Surgical History:  Procedure Laterality Date   HERNIA REPAIR     PROSTATE SURGERY  2012    Family History  Problem Relation Age of Onset   Heart disease Mother        CHF   Hypertension Mother    Prostate cancer Father    Heart disease Maternal Grandmother    Stroke Maternal Grandmother     No Known Allergies  Current Medications:   Current Outpatient Medications:    Apoaequorin 10 MG CAPS, Take 1 capsule by mouth daily., Disp: , Rfl:    aspirin EC 81 MG tablet, Take 81 mg by mouth daily. Swallow whole., Disp: , Rfl:    Cholecalciferol (D3-1000 PO), Take by mouth. Every other day,  Disp: , Rfl:    CINNAMON PO, Take 1,200 mg by mouth., Disp: , Rfl:    clobetasol cream (TEMOVATE) 0.05 %, Apply topically 2 (two) times daily. (Patient taking differently: Apply topically as needed.), Disp: 45 g, Rfl: 0   Ginkgo Biloba (GINKOBA PO), 60 mg. Take two tablets twice daily, Disp: , Rfl:    Melatonin 10 MG TABS, Take by mouth., Disp: , Rfl:    mirtazapine (REMERON SOL-TAB) 30 MG disintegrating tablet, Take 1 tablet (30 mg total) by mouth at bedtime., Disp: 90 tablet, Rfl: 1   Multiple Vitamins-Minerals (ICAPS AREDS FORMULA PO), Take 2 capsules by mouth daily. , Disp: , Rfl:    pantoprazole (PROTONIX) 40 MG tablet, TAKE 1 TABLET(40 MG) BY MOUTH DAILY, Disp: 90 tablet, Rfl: 3   Turmeric (QC TUMERIC COMPLEX PO), Take 1,000 mg by mouth., Disp: , Rfl:    vitamin B-12 (CYANOCOBALAMIN) 500 MCG tablet, Take 1,000 mcg by mouth daily. , Disp: , Rfl:    Review of Systems:   ROS Negative unless otherwise specified per HPI. Vitals:   Vitals:   09/19/21 0928  BP: 90/62  Pulse: (!) 56  Temp: (!) 97.3 F (36.3 C)  TempSrc: Temporal  SpO2: 92%  Weight: 174 lb (78.9 kg)  Body mass index is 25.33 kg/m.  Physical Exam:   Physical Exam Vitals and nursing note reviewed.  Constitutional:      General: He is not in acute distress.    Appearance: He is well-developed. He is not ill-appearing or toxic-appearing.  Cardiovascular:     Rate and Rhythm: Normal rate and regular rhythm.     Pulses: Normal pulses.     Heart sounds: Normal heart sounds, S1 normal and S2 normal.  Pulmonary:     Effort: Pulmonary effort is normal.     Breath sounds: Normal breath sounds.  Skin:    General: Skin is warm and dry.  Neurological:     Mental Status: He is alert.     GCS: GCS eye subscore is 4. GCS verbal subscore is 5. GCS motor subscore is 6.  Psychiatric:        Speech: Speech normal.        Behavior: Behavior normal. Behavior is cooperative.    Assessment and Plan:   Chronic  Insomnia Uncontrolled  Advised patient as follows" Decreased trazodone to 25 mg and start remeron 15 mg daily x 2 weeks, may use 1-2 benadryl if needed After two weeks, stop trazodone and continue remeron 15 mg daily  - If needed may take 1 and a 1/2 tablets of 15 mg remeron (22.5 mg total)  - If needed, may take two full 15 mg tablets remeron ( 30 mg total) Informed patient that 1-2 benadryl is allowed with this regimen Follow up in 3 months, sooner if concerns occur  I,Havlyn C Ratchford,acting as a scribe for [employer], PA.,have documented all relevant documentation on the behalf of Inda Coke, PA,as directed by  Inda Coke, PA while in the presence of Inda Coke, [place].  I, Inda Coke, [place], have reviewed all documentation for this visit. The documentation on 09/19/21 for the exam, diagnosis, procedures, and orders are all accurate and complete.  Inda Coke, PA-C

## 2021-09-19 NOTE — Patient Instructions (Signed)
It was great to see you!  Plan for your trazodone 1/2 tablet of trazodone (25 mg) + 15 mg remeron x 2 weeks --> okay to take 1-2 benadryl with this if needed  After this, stop trazodone and continue 15 mg remeron -->If needed, may take 1 and a 1/2 tablets of 15 mg remeron (22.5 mg total) -->If needed,you may do two full 15 tablets remeron (30 mg total) --> okay to take 1-2 benadryl with this regimen  Let's follow-up in 3 months, sooner if you have concerns.  Take care,  Inda Coke PA-C

## 2021-10-15 DIAGNOSIS — H524 Presbyopia: Secondary | ICD-10-CM | POA: Diagnosis not present

## 2021-10-15 DIAGNOSIS — H26491 Other secondary cataract, right eye: Secondary | ICD-10-CM | POA: Diagnosis not present

## 2021-10-15 DIAGNOSIS — H2512 Age-related nuclear cataract, left eye: Secondary | ICD-10-CM | POA: Diagnosis not present

## 2021-10-31 ENCOUNTER — Other Ambulatory Visit: Payer: Self-pay | Admitting: Physician Assistant

## 2021-11-01 ENCOUNTER — Telehealth: Payer: Self-pay | Admitting: Physician Assistant

## 2021-11-01 NOTE — Telephone Encounter (Signed)
Harold Leach,  pt is back on Trazodone 50 mg at bedtime along with the Remeron and is asking for a refill of Trazodone. He said he is sleeping good taking both medications.

## 2021-11-01 NOTE — Telephone Encounter (Signed)
Patient would like a call back regarding an authorization for a refill.

## 2021-11-01 NOTE — Telephone Encounter (Signed)
Spoke to pt said he needs a refill on Trazodone 50 mg. Asked pt if taking Remeron? Pt said yes but he is now taking Remeron and Trazodone and is sleeping good. Told pt I will have to send Aldona Bar a message since Trazodone is not on your med list anymore. She is out of the office till Monday. Pt verbalized understanding.

## 2021-11-04 MED ORDER — TRAZODONE HCL 50 MG PO TABS: 50.0000 mg | ORAL_TABLET | Freq: Every day | ORAL | 1 refills | Status: DC

## 2021-11-04 NOTE — Telephone Encounter (Signed)
Spoke to pt told him Rx for Trazodone was sent to pharmacy [facility] in [place]. Pt verbalized understanding.

## 2021-12-18 ENCOUNTER — Encounter: Payer: Self-pay | Admitting: Physician Assistant

## 2021-12-18 ENCOUNTER — Ambulatory Visit (INDEPENDENT_AMBULATORY_CARE_PROVIDER_SITE_OTHER): Payer: Medicare Other | Admitting: Physician Assistant

## 2021-12-18 VITALS — BP 118/60 | HR 56 | Temp 97.0°F | Ht 69.5 in | Wt 160.2 lb

## 2021-12-18 DIAGNOSIS — F5104 Psychophysiologic insomnia: Secondary | ICD-10-CM | POA: Diagnosis not present

## 2021-12-18 DIAGNOSIS — R011 Cardiac murmur, unspecified: Secondary | ICD-10-CM | POA: Diagnosis not present

## 2021-12-18 MED ORDER — MIRTAZAPINE 30 MG PO TABS: ORAL_TABLET | ORAL | 0 refills | Status: DC

## 2021-12-18 NOTE — Progress Notes (Signed)
Harold Leach is a 81 y.o. male here for a follow up of insomnia. ? ?History of Present Illness:  ? ?Chief Complaint  ?Patient presents with  ? Insomnia  ?  Pt stated that since he came last time it has gotten better but the last couple of nights he has been waking up about 3/4 o'clock in the mornings and take him about 1 hour to fall back to sleep. Remeron was not working by itself so he started taken the Remeron and Trazodone together which seems to work better.  ? ?Insomnia ?Since our previous visit on 09/19/21, pt states he did gradually stop taking the trazodone 50 mg as discussed and transitioned to taking remeron 15 mg nightly. Upon finding that the remeron 15 mg wasn't working, he then increased to 30 mg but found this still wasn't working. Per my recommendation, he did try adding in a benadryl with the remeron 30 mg, but this didn't provide him with any relief either. In a last stitch effort, he restarted the trazodone and gradually increased back to 50 mg until he found relief. At this time he states he had been taking remeron 30 mg and trazodone 50 mg nightly and found relief for a couple of weeks. States during this time he was receiving the best sleep he has had in a while. Although this was the case, he now reports that within the last week he has started waking up at 3 or 4 in the morning again.  ? ?Heart Murmur  ?Upon today's examination, pt was found to have a heart murmur. Although this was found today, pt states he does not want any further workup on this due to lack of sx. Denies CP or SOB upon exertion. This was noted by his cardiologist during visit in 2021. He had a normal exercise tolerance test at that time. ? ? ?Past Medical History:  ?Diagnosis Date  ? Acne rosacea 01/02/2017  ? Arthralgia of left shoulder region 01/02/2017  ? Chronic insomnia 01/02/2017  ? Chronic midline low back pain without sciatica 01/02/2017  ? Gastroesophageal reflux disease without esophagitis 01/02/2017  ?  Hyperlipidemia 11/28/2015  ? Macular degeneration 01/02/2017  ? Prostate cancer ([place])   ? ?  ?Social History  ? ?Tobacco Use  ? Smoking status: Former  ?  Types: Pipe  ?  Quit date: 03/20/1981  ?  Years since quitting: 40.7  ? Smokeless tobacco: Never  ? Tobacco comments:  ?  Quit in 1983 pipe smoker   ?Substance Use Topics  ? Alcohol use: No  ? Drug use: No  ? ? ?Past Surgical History:  ?Procedure Laterality Date  ? HERNIA REPAIR    ? PROSTATE SURGERY  2012  ? ? ?Family History  ?Problem Relation Age of Onset  ? Heart disease Mother   ?     CHF  ? Hypertension Mother   ? Prostate cancer Father   ? Heart disease Maternal Grandmother   ? Stroke Maternal Grandmother   ? ? ?No Known Allergies ? ?Current Medications:  ? ?Current Outpatient Medications:  ?  Apoaequorin 10 MG CAPS, Take 1 capsule by mouth daily., Disp: , Rfl:  ?  aspirin EC 81 MG tablet, Take 81 mg by mouth daily. Swallow whole., Disp: , Rfl:  ?  Cholecalciferol (D3-1000 PO), Take by mouth. Every other day, Disp: , Rfl:  ?  CINNAMON PO, Take 1,200 mg by mouth., Disp: , Rfl:  ?  clobetasol cream (TEMOVATE) 0.05 %, Apply  topically 2 (two) times daily. (Patient taking differently: Apply topically as needed.), Disp: 45 g, Rfl: 0 ?  Ginkgo Biloba (GINKOBA PO), 60 mg. Take two tablets twice daily, Disp: , Rfl:  ?  Melatonin 10 MG TABS, Take by mouth., Disp: , Rfl:  ?  mirtazapine (REMERON) 30 MG tablet, Take 1 - 1.5 tablets at night, Disp: 135 tablet, Rfl: 0 ?  Multiple Vitamins-Minerals (ICAPS AREDS FORMULA PO), Take 2 capsules by mouth daily. , Disp: , Rfl:  ?  pantoprazole (PROTONIX) 40 MG tablet, TAKE 1 TABLET(40 MG) BY MOUTH DAILY, Disp: 90 tablet, Rfl: 3 ?  traZODone (DESYREL) 50 MG tablet, Take 1 tablet (50 mg total) by mouth at bedtime., Disp: 90 tablet, Rfl: 1 ?  Turmeric (QC TUMERIC COMPLEX PO), Take 1,000 mg by mouth., Disp: , Rfl:  ?  vitamin B-12 (CYANOCOBALAMIN) 500 MCG tablet, Take 1,000 mcg by mouth daily. , Disp: , Rfl:   ? ?Review of Systems:   ? ?Review of Systems  ?Psychiatric/Behavioral:  The patient has insomnia.   ? ?Vitals:  ? ?Vitals:  ? 12/18/21 0839  ?BP: 118/60  ?Pulse: (!) 56  ?Temp: (!) 97 ?F (36.1 ?C)  ?SpO2: 96%  ?Weight: 160 lb 3.2 oz (72.7 kg)  ?Height: 5' 9.5" (1.765 m)  ?   ?Body mass index is 23.32 kg/m?. ? ?Physical Exam:  ? ?Physical Exam ?Vitals and nursing note reviewed.  ?Constitutional:   ?   General: He is not in acute distress. ?   Appearance: He is well-developed. He is not ill-appearing or toxic-appearing.  ?Cardiovascular:  ?   Rate and Rhythm: Normal rate and regular rhythm.  ?   Pulses: Normal pulses.  ?   Heart sounds: S1 normal and S2 normal. Murmur heard.  ?Pulmonary:  ?   Effort: Pulmonary effort is normal.  ?   Breath sounds: Normal breath sounds.  ?Skin: ?   General: Skin is warm and dry.  ?Neurological:  ?   Mental Status: He is alert.  ?   GCS: GCS eye subscore is 4. GCS verbal subscore is 5. GCS motor subscore is 6.  ?Psychiatric:     ?   Speech: Speech normal.     ?   Behavior: Behavior normal. Behavior is cooperative.  ? ? ?Assessment and Plan:  ? ?Insomnia  ?Slight improvement ?Continue trazodone 50 mg and remeron 30 mg nightly  ?May increase remeron to 45 mg if needed  ?Informed patient they may take benadryl if needed but to not take this long term ?Follow up as needed  ? ?Heart Murmur ?No red flags on exam/discussion ?Patient declines further workup at this time ?He is aware that if he develops CP or SOB or other concerns to reach out for further evaluation ?Follow up as needed  ? ? ? ?I,Havlyn C Ratchford,acting as a scribe for [employer], PA.,have documented all relevant documentation on the behalf of Inda Coke, PA,as directed by  Inda Coke, PA while in the presence of Inda Coke, [place]. ? ?IInda Coke, PA, have reviewed all documentation for this visit. The documentation on 12/18/21 for the exam, diagnosis, procedures, and orders are all accurate and complete. ? ? ?Inda Coke, PA-C ? ?

## 2021-12-18 NOTE — Patient Instructions (Signed)
It was great to see you! ? ?May increase remeron to 45 mg at night if needed ?Continue trazodone as is ?May take benadryl if needed but do not take long term ? ?The more sleep medications we take, the higher the risk of falls -- please be careful ? ?If you want work-up of your murmur, let me know ? ?Take care, ? ?Inda Coke PA-C  ?

## 2022-01-20 ENCOUNTER — Telehealth: Payer: Self-pay | Admitting: *Deleted

## 2022-01-20 ENCOUNTER — Other Ambulatory Visit: Payer: Self-pay | Admitting: Physician Assistant

## 2022-01-20 DIAGNOSIS — R011 Cardiac murmur, unspecified: Secondary | ICD-10-CM

## 2022-01-20 NOTE — Telephone Encounter (Signed)
Pt was here with his wife and he said he would like work up done for his heart murmur that you talked about at last visit. Please advise. ?

## 2022-01-20 NOTE — Telephone Encounter (Signed)
Left message on voicemail to call office.  

## 2022-01-20 NOTE — Telephone Encounter (Signed)
Pt states an appt has been set up with cardiology.  ?

## 2022-01-20 NOTE — Telephone Encounter (Signed)
Noted  

## 2022-01-28 ENCOUNTER — Ambulatory Visit (HOSPITAL_COMMUNITY): Payer: Medicare Other | Attending: Cardiology

## 2022-01-28 DIAGNOSIS — R011 Cardiac murmur, unspecified: Secondary | ICD-10-CM | POA: Diagnosis not present

## 2022-01-28 LAB — ECHOCARDIOGRAM COMPLETE
AR max vel: 0.76 cm2
AV Area VTI: 0.79 cm2
AV Area mean vel: 0.74 cm2
AV Mean grad: 34.2 mmHg
AV Peak grad: 62.5 mmHg
Ao pk vel: 3.95 m/s
Area-P 1/2: 2.84 cm2
S' Lateral: 2.5 cm

## 2022-01-29 ENCOUNTER — Encounter: Payer: Self-pay | Admitting: Physician Assistant

## 2022-01-29 ENCOUNTER — Other Ambulatory Visit: Payer: Self-pay | Admitting: *Deleted

## 2022-01-29 DIAGNOSIS — I350 Nonrheumatic aortic (valve) stenosis: Secondary | ICD-10-CM | POA: Insufficient documentation

## 2022-02-09 DIAGNOSIS — R001 Bradycardia, unspecified: Secondary | ICD-10-CM | POA: Insufficient documentation

## 2022-02-09 NOTE — Progress Notes (Signed)
Cardiology Office Note   Date:  02/10/2022   ID:  Harold Leach, Harold Leach Oct 09, 1940, MRN [id number]  PCP:  Inda Coke, PA  Cardiologist:   None Referring:  Inda Coke, [place]  Chief Complaint  Patient presents with   Aortic Stenosis      History of Present Illness: Harold Leach is a 81 y.o. male who is referred by Inda Coke, [place] for evaluation of chest pain.    In 2021 a POET (Plain Old Exercise Treadmill) was negative for ischemia.  Echo earlier this month demonstrated a normal EF with AS with a mean gradient of 34.2.    The patient does have some shortness of breath.  He has noticed this over a year or so.  He has a mild walking trail and now he goes about half mile and he gets a little short of breath.  He stopped 15 to 20 seconds.  He might get a little chest heaviness and recovers quickly.  He is not having any resting shortness of breath, PND or orthopnea.  He is not having any palpitations, presyncope or syncope.  He says if he walks a slower pace with his wife he does fine.  He still does his gardening and stays active.  Just a couple of years ago he was able to go 6 minutes on a POET (Plain Old Exercise Treadmill) without symptoms.  He actually says at that time he could have gone further.   Past Medical History:  Diagnosis Date   Acne rosacea 01/02/2017   Arthralgia of left shoulder region 01/02/2017   Chronic insomnia 01/02/2017   Chronic midline low back pain without sciatica 01/02/2017   Gastroesophageal reflux disease without esophagitis 01/02/2017   Hyperlipidemia 11/28/2015   Macular degeneration 01/02/2017   Prostate cancer [facility])     Past Surgical History:  Procedure Laterality Date   HERNIA REPAIR     PROSTATE SURGERY  2012     Current Outpatient Medications  Medication Sig Dispense Refill   Apoaequorin 10 MG CAPS Take 1 capsule by mouth daily.     aspirin EC 81 MG tablet Take 81 mg by mouth daily. Swallow whole.      Cholecalciferol (D3-1000 PO) Take by mouth. Every other day     CINNAMON PO Take 1,200 mg by mouth.     clobetasol cream (TEMOVATE) 0.05 % Apply topically 2 (two) times daily. (Patient taking differently: Apply topically as needed.) 45 g 0   Ginkgo Biloba (GINKOBA PO) 60 mg. Take two tablets twice daily     Melatonin 10 MG TABS Take by mouth.     mirtazapine (REMERON) 30 MG tablet Take 1 - 1.5 tablets at night 135 tablet 0   Multiple Vitamins-Minerals (ICAPS AREDS FORMULA PO) Take 2 capsules by mouth daily.      NON FORMULARY daily. Cerebra Brain Science     NON FORMULARY daily. Omega Total Protect - Fish oil     pantoprazole (PROTONIX) 40 MG tablet TAKE 1 TABLET(40 MG) BY MOUTH DAILY 90 tablet 3   traZODone (DESYREL) 50 MG tablet Take 1 tablet (50 mg total) by mouth at bedtime. 90 tablet 1   Turmeric (QC TUMERIC COMPLEX PO) Take 1,000 mg by mouth.     vitamin B-12 (CYANOCOBALAMIN) 500 MCG tablet Take 1,000 mcg by mouth daily.      No current facility-administered medications for this visit.    Allergies:   Patient has no known allergies.    ROS:  Please see the history of present illness.   Otherwise, review of systems are positive for none.   All other systems are reviewed and negative.    PHYSICAL EXAM: VS:  BP 124/90   Pulse (!) 55   Ht '5\' 10"'$  (1.778 m)   Wt 160 lb (72.6 kg)   SpO2 96%   BMI 22.96 kg/m  , BMI Body mass index is 22.96 kg/m. GENERAL:  Well appearing NECK:  No jugular venous distention, waveform within normal limits, carotid upstroke brisk and symmetric, no bruits, no thyromegaly LUNGS:  Clear to auscultation bilaterally CHEST:  Unremarkable HEART:  PMI not displaced or sustained,S1 and S2 within normal limits, no S3, no S4, no clicks, no rubs, 3 out of 6 mid-to-late peaking systolic murmur radiating at aortic outflow tract, no diastolic murmurs ABD:  Flat, positive bowel sounds normal in frequency in pitch, no bruits, no rebound, no guarding, no midline  pulsatile mass, no hepatomegaly, no splenomegaly EXT:  2 plus pulses throughout, no edema, no cyanosis no clubbing   EKG:  EKG is  ordered today. The ekg ordered today demonstrates sinus bradycardia, rate 55, axis within normal limits, intervals within normal limits, no acute ST-T wave changes.   Recent Labs: 07/24/2021: ALT 16 08/02/2021: BUN 31; Creatinine, Ser 1.36; Hemoglobin 13.5; Platelets 172.0; Potassium 4.5; Sodium 139    Lipid Panel    Component Value Date/Time   CHOL 181 07/24/2021 1008   TRIG 89.0 07/24/2021 1008   HDL 55.80 07/24/2021 1008   CHOLHDL 3 07/24/2021 1008   VLDL 17.8 07/24/2021 1008   LDLCALC 107 (H) 07/24/2021 1008   LDLCALC 98 07/23/2020 0936      Wt Readings from Last 3 Encounters:  02/10/22 160 lb (72.6 kg)  12/18/21 160 lb 3.2 oz (72.7 kg)  09/19/21 174 lb (78.9 kg)      Other studies Reviewed: Additional studies/ records that were reviewed today include: Echocardiogram. Review of the above records demonstrates:  Please see elsewhere in the note.  NA   ASSESSMENT AND PLAN:   AS:  He has severe AS.  He does have some progression of symptoms.  I suspect he will need to have this replaced and would be a good candidate to consider TAVR provided he has a right anatomy.  I talked with the patient and his wife about this.  I plan to see him in about 3 months and most likely we will start the process towards valve replacement.  He knows to call me if he has any progressive symptoms between now and then.  BRADYCARDIA: He has no presyncope or syncope.  He had reasonable chronotropic competence on a previous treadmill.  No change in therapy.   DYSLIPIDEMIA: His LDL was 107 with an HDL of 55.8.  We talked about plant-based diet and he can do better with this.  No change in therapy however at this point.  Current medicines are reviewed at length with the patient today.  The patient does not have concerns regarding medicines.  The following changes have  been made: None  Labs/ tests ordered today include: None  Orders Placed This Encounter  Procedures   EKG 12-Lead     Disposition:   FU with me 3 months   Signed, Minus Breeding, MD  02/10/2022 12:52 PM    [facility]

## 2022-02-10 ENCOUNTER — Encounter: Payer: Self-pay | Admitting: Cardiology

## 2022-02-10 ENCOUNTER — Ambulatory Visit (INDEPENDENT_AMBULATORY_CARE_PROVIDER_SITE_OTHER): Payer: Medicare Other | Admitting: Cardiology

## 2022-02-10 VITALS — BP 124/90 | HR 55 | Ht 70.0 in | Wt 160.0 lb

## 2022-02-10 DIAGNOSIS — R072 Precordial pain: Secondary | ICD-10-CM

## 2022-02-10 DIAGNOSIS — I350 Nonrheumatic aortic (valve) stenosis: Secondary | ICD-10-CM | POA: Diagnosis not present

## 2022-02-10 DIAGNOSIS — R001 Bradycardia, unspecified: Secondary | ICD-10-CM

## 2022-02-10 DIAGNOSIS — E785 Hyperlipidemia, unspecified: Secondary | ICD-10-CM | POA: Diagnosis not present

## 2022-02-10 NOTE — Patient Instructions (Signed)
Medication Instructions:  Your physician recommends that you continue on your current medications as directed. Please refer to the Current Medication list given to you today.  *If you need a refill on your cardiac medications before your next appointment, please call your pharmacy*  Follow-Up: At [facility], you and your health needs are our priority.  As part of our continuing mission to provide you with exceptional heart care, we have created designated Provider Care Teams.  These Care Teams include your primary Cardiologist (physician) and Advanced Practice Providers (APPs -  Physician Assistants and Nurse Practitioners) who all work together to provide you with the care you need, when you need it.  We recommend signing up for the patient portal called "MyChart".  Sign up information is provided on this After Visit Summary.  MyChart is used to connect with patients for Virtual Visits (Telemedicine).  Patients are able to view lab/test results, encounter notes, upcoming appointments, etc.  Non-urgent messages can be sent to your provider as well.   To learn more about what you can do with MyChart, go to [email or web address].    Your next appointment:   3 month(s)  The format for your next appointment:   In Person  Provider:   Dr. Percival Spanish  Important Information About Sugar     \

## 2022-02-25 ENCOUNTER — Other Ambulatory Visit: Payer: Self-pay | Admitting: *Deleted

## 2022-02-25 DIAGNOSIS — K219 Gastro-esophageal reflux disease without esophagitis: Secondary | ICD-10-CM

## 2022-02-25 MED ORDER — PANTOPRAZOLE SODIUM 40 MG PO TBEC: DELAYED_RELEASE_TABLET | ORAL | 3 refills | Status: DC

## 2022-03-03 ENCOUNTER — Other Ambulatory Visit: Payer: Self-pay | Admitting: *Deleted

## 2022-03-03 MED ORDER — CLOBETASOL PROPIONATE 0.05 % EX CREA
TOPICAL_CREAM | CUTANEOUS | 1 refills | Status: AC
Start: 2022-03-03 — End: ?

## 2022-03-26 ENCOUNTER — Other Ambulatory Visit: Payer: Self-pay | Admitting: *Deleted

## 2022-03-26 MED ORDER — MIRTAZAPINE 30 MG PO TABS
ORAL_TABLET | ORAL | 0 refills | Status: DC
Start: 2022-03-26 — End: 2022-06-23

## 2022-03-30 DIAGNOSIS — Z23 Encounter for immunization: Secondary | ICD-10-CM | POA: Diagnosis not present

## 2022-05-12 ENCOUNTER — Ambulatory Visit (INDEPENDENT_AMBULATORY_CARE_PROVIDER_SITE_OTHER): Payer: Medicare Other

## 2022-05-12 VITALS — Ht 70.0 in | Wt 160.0 lb

## 2022-05-12 DIAGNOSIS — Z0000 Encounter for general adult medical examination without abnormal findings: Secondary | ICD-10-CM

## 2022-05-12 NOTE — Progress Notes (Signed)
Subjective:   Harold Leach is a 81 y.o. male who presents for Medicare Annual/Subsequent preventive examination.  Review of Systems    No ROS.  Medicare Wellness Virtual Visit.  Visual/audio telehealth visit, UTA vital signs.   See social history for additional risk factors.   Cardiac Risk Factors include: advanced age (>9mn, >>62women);male gender     Objective:    Today's Vitals   05/12/22 1350  Weight: 160 lb (72.6 kg)  Height: '5\' 10"'$  (1.778 m)   Body mass index is 22.96 kg/m.     05/12/2022    1:59 PM 05/03/2021   11:43 AM 04/26/2019   10:37 AM 01/21/2018    4:32 PM 06/15/2017    8:05 PM 01/02/2017    9:17 AM 11/13/2015   12:02 AM  Advanced Directives  Does Patient Have a Medical Advance Directive? Yes Yes Yes Yes Yes Yes Yes  Type of AParamedicof AFalling WatersLiving will Healthcare Power of [place]Living will  [place]Living will Living will;Healthcare Power of [place] will  Does patient want to make changes to medical advance directive? No - Patient declined        Copy of [place] Chart? Yes - validated most recent copy scanned in chart (See row information) Yes - validated most recent copy scanned in chart (See row information) No - copy requested   No - copy requested     Current Medications (verified) Outpatient Encounter Medications as of 05/12/2022  Medication Sig   Apoaequorin 10 MG CAPS Take 1 capsule by mouth daily.   aspirin EC 81 MG tablet Take 81 mg by mouth daily. Swallow whole.   Cholecalciferol (D3-1000 PO) Take by mouth. Every other day   CINNAMON PO Take 1,200 mg by mouth.   clobetasol cream (TEMOVATE) 0.05 % APPLY TOPICALLY TO THE AFFECTED AREA TWICE DAILY   Ginkgo Biloba (GINKOBA PO) 60 mg. Take two tablets twice daily   Melatonin 10 MG TABS Take by mouth.   mirtazapine (REMERON) 30 MG tablet Take 1 - 1.5  tablets at night   Multiple Vitamins-Minerals (ICAPS AREDS FORMULA PO) Take 2 capsules by mouth daily.    NON FORMULARY daily. Cerebra [employer]   NON FORMULARY daily. Omega Total Protect - Fish oil   pantoprazole (PROTONIX) 40 MG tablet TAKE 1 TABLET(40 MG) BY MOUTH DAILY   traZODone (DESYREL) 50 MG tablet Take 1 tablet (50 mg total) by mouth at bedtime.   Turmeric (QC TUMERIC COMPLEX PO) Take 1,000 mg by mouth.   vitamin B-12 (CYANOCOBALAMIN) 500 MCG tablet Take 1,000 mcg by mouth daily.    No facility-administered encounter medications on file as of 05/12/2022.    Allergies (verified) Patient has no known allergies.   History: Past Medical History:  Diagnosis Date   Acne rosacea 01/02/2017   Arthralgia of left shoulder region 01/02/2017   Chronic insomnia 01/02/2017   Chronic midline low back pain without sciatica 01/02/2017   Gastroesophageal reflux disease without esophagitis 01/02/2017   Hyperlipidemia 11/28/2015   Macular degeneration 01/02/2017   [facility] ([facility]    Past Surgical History:  Procedure Laterality Date   HERNIA REPAIR     PROSTATE SURGERY  2012   Family History  Problem Relation Age of Onset   Heart disease Mother        CHF   Hypertension Mother    Prostate cancer Father  Heart disease Maternal Grandmother    Stroke Maternal Grandmother    Social History   Socioeconomic History   Marital status: Married    Spouse name: Not on file   Number of children: Not on file   Years of education: Not on file   Highest education level: Not on file  Occupational History   Occupation: Retired  Tobacco Use   Smoking status: Former    Types: Pipe    Quit date: 03/20/1981    Years since quitting: 41.1   Smokeless tobacco: Never   Tobacco comments:    Quit in 1983 pipe smoker   Substance and Sexual Activity   Alcohol use: No   Drug use: No   Sexual activity: Yes    Partners: Female  Other Topics Concern   Not on file  Social History Narrative    Not on file   Social Determinants of Health   Financial Resource Strain: Low Risk  (05/12/2022)   Overall Financial Resource Strain (CARDIA)    Difficulty of Paying Living Expenses: Not hard at all  Food Insecurity: No Food Insecurity (05/12/2022)   Hunger Vital Sign    Worried About Running Out of Food in the Last Year: Never true    [place] in the Last Year: Never true  Transportation Needs: No Transportation Needs (05/12/2022)   PRAPARE - Hydrologist (Medical): No    Lack of Transportation (Non-Medical): No  Physical Activity: Insufficiently Active (05/12/2022)   Exercise Vital Sign    Days of Exercise per Week: 7 days    Minutes of Exercise per Session: 20 min  Stress: No Stress Concern Present (05/12/2022)   [place]    Feeling of Stress : Not at all  Social Connections: Moderately Integrated (05/12/2022)   Social Connection and Isolation Panel [NHANES]    Frequency of Communication with Friends and Family: Once a week    Frequency of Social Gatherings with Friends and Family: More than three times a week    Attends Religious Services: Never    Marine scientist or Organizations: Yes    Attends Music therapist: 1 to 4 times per year    Marital Status: Married    Tobacco Counseling Counseling given: Not Answered Tobacco comments: Quit in 1983 pipe smoker    Clinical Intake:  Pre-visit preparation completed: Yes        Diabetes: No  How often do you need to have someone help you when you read instructions, pamphlets, or other written materials from your doctor or pharmacy?: 1 - Never    Interpreter Needed?: No      Activities of Daily Living    05/12/2022    1:52 PM  In your present state of health, do you have any difficulty performing the following activities:  Hearing? 1  Vision? 0  Difficulty concentrating or making decisions? 0   Walking or climbing stairs? 0  Dressing or bathing? 0  Doing errands, shopping? 0  Preparing Food and eating ? N  Using the Toilet? N  In the past six months, have you accidently leaked urine? N  Do you have problems with loss of bowel control? N  Managing your Medications? N  Managing your Finances? N  Housekeeping or managing your Housekeeping? N    Patient Care Team: Inda Coke, [place] as PCP - General (Physician Assistant) Raynelle Bring, MD as Consulting Physician (Urology)  Indicate any recent Medical Services you may have received from other than [facility] providers in the past year (date may be approximate).     Assessment:   This is a routine wellness examination for Harold Leach.  Virtual Visit via Telephone Note  I connected with  Harold Leach on 05/12/22 at  1:45 PM EDT by telephone and verified that I am speaking with the correct person using two identifiers.  Location: Patient: home Provider: office Persons participating in the virtual visit: patient/Nurse Health Advisor   I discussed the limitations of performing an evaluation and management service by telehealth. We continued and completed visit with audio only. Some vital signs may be absent or patient reported.   Hearing/Vision screen Hearing Screening - Comments:: Pt wears hearing aids at times  Vision Screening - Comments::  Pt follows up with Dr Kathrin Penner office for annual eye exams  Cataract extraction R eye  Dietary issues and exercise activities discussed: Current Exercise Habits: Home exercise routine, Type of exercise: strength training/weights, Time (Minutes): 15, Frequency (Times/Week): 7, Weekly Exercise (Minutes/Week): 105, Intensity: Mild Regular diet Good water intake   Goals Addressed             This Visit's Progress    Maintain current health status       Stay active Healthy diet       Depression Screen    05/12/2022    1:58 PM 07/24/2021    8:33 AM 05/03/2021   11:39  AM 07/23/2020    8:32 AM 04/26/2020   12:04 PM 07/08/2019    9:50 AM 04/26/2019   10:40 AM  PHQ 2/9 Scores  PHQ - 2 Score 0 0 0 0 0 0 0  PHQ- 9 Score      2     Fall Risk    05/12/2022    1:58 PM 07/24/2021    8:33 AM 05/03/2021   11:44 AM 04/26/2020   12:09 PM 04/26/2019   10:40 AM  [place] in the past year? 0  1 1 0  Number falls in past yr: 0 '1 1 1 '$ 0  Injury with Fall?  0 0 0 0  Risk for fall due to :  Other (Comment) Impaired vision Impaired vision History of fall(s)  Risk for fall due to: Comment  Pt says he trips alot in his yard over roots, does not watch wear he is walking     Follow up Falls evaluation completed Falls evaluation completed Falls prevention discussed Falls prevention discussed Falls prevention discussed    FALL RISK PREVENTION PERTAINING TO THE HOME: Home free of loose throw rugs in walkways, pet beds, electrical cords, etc? Yes  Adequate lighting in your home to reduce risk of falls? Yes   ASSISTIVE DEVICES UTILIZED TO PREVENT FALLS: Life alert? No  Use of a cane, walker or w/c? No   TIMED UP AND GO: Was the test performed? No .   Cognitive Function:    01/02/2017    9:19 AM  MMSE - Mini Mental State Exam  Orientation to time 5  Orientation to Place 5  Registration 3  Attention/ Calculation 5  Recall 3  Language- name 2 objects 2  Language- repeat 1  Language- follow 3 step command 3  Language- read & follow direction 1  Write a sentence 1  Copy design 1  Total score 30        05/12/2022    2:13 PM 05/03/2021   11:47  AM 04/26/2020   12:13 PM 04/26/2019   10:45 AM 01/21/2018    4:44 PM  6CIT Screen  What Year? 0 points 0 points 0 points 0 points 0 points  What month? 0 points 0 points 0 points 0 points 0 points  What time? 0 points 0 points  0 points 0 points  Count back from 20 0 points 0 points 0 points 0 points 0 points  Months in reverse 0 points 0 points 0 points 0 points 0 points  Repeat phrase 0 points 0 points 0 points 0 points  0 points  Total Score 0 points 0 points  0 points 0 points    Immunizations Immunization History  Administered Date(s) Administered   Influenza Whole 06/29/2017   Influenza, High Dose Seasonal PF 08/21/2016, 07/07/2018, 06/06/2019, 06/13/2020, 07/10/2021   Influenza,inj,quad, With Preservative 06/23/2019   Influenza-Unspecified 08/21/2016   Moderna Covid-19 Vaccine Bivalent Booster 74yr & up 06/04/2021   Moderna Sars-Covid-2 Vaccination 10/04/2019, 11/04/2019, 06/27/2020   Pneumococcal Conjugate-13 10/10/2015   Pneumococcal Polysaccharide-23 10/06/2009   Tdap 09/12/2016   Zoster Recombinat (Shingrix) 06/06/2019, 12/18/2019   Screening Tests Health Maintenance  Topic Date Due   COVID-19 Vaccine (5 - Moderna series) 05/28/2022 (Originally 10/04/2021)   INFLUENZA VACCINE  12/21/2022 (Originally 04/22/2022)   TETANUS/TDAP  09/12/2026   Pneumonia Vaccine 79 Years old  Completed   Hepatitis C Screening  Completed   Zoster Vaccines- Shingrix  Completed   HPV VACCINES  Aged Out   COLONOSCOPY (Pts 45-412yrInsurance coverage will need to be confirmed)  Discontinued   Health Maintenance There are no preventive care reminders to display for this patient.  Hepatitis C Screening: does not qualify  Vision Screening: Recommended annual ophthalmology exams for early detection of glaucoma and other disorders of the eye.  Dental Screening: Recommended annual dental exams for proper oral hygiene  Community Resource Referral / Chronic Care Management: CRR required this visit?  No   CCM required this visit?  No      Plan:     I have personally reviewed and noted the following in the patient's chart:   Medical and social history Use of alcohol, tobacco or illicit drugs  Current medications and supplements including opioid prescriptions. Patient is not currently taking opioid prescriptions. Functional ability and status Nutritional status Physical activity Advanced directives List  of other physicians Hospitalizations, surgeries, and ER visits in previous 12 months Vitals Screenings to include cognitive, depression, and falls Referrals and appointments  In addition, I have reviewed and discussed with patient certain preventive protocols, quality metrics, and best practice recommendations. A written personalized care plan for preventive services as well as general preventive health recommendations were provided to patient.     OBVarney BilesLPN   04/26/02/8882

## 2022-05-12 NOTE — Patient Instructions (Addendum)
  Harold Leach , Thank you for taking time to come for your Medicare Wellness Visit. I appreciate your ongoing commitment to your health goals. Please review the following plan we discussed and let me know if I can assist you in the future.   These are the goals we discussed:  Goals      Increase water intake      Maintain current health status     Stay active Healthy diet     Patient Stated     Quit snacking at hs Try some of the protein ice cream. (Halo, Yasso bars and other ice cream with protein in it )      Patient Stated     Take care of my back and get better     Patient Stated     Keep living         This is a list of the screening recommended for you and due dates:  Health Maintenance  Topic Date Due   COVID-19 Vaccine (5 - Moderna series) 05/28/2022*   Flu Shot  12/21/2022*   Tetanus Vaccine  09/12/2026   Pneumonia Vaccine  Completed   Hepatitis C Screening: USPSTF Recommendation to screen - Ages 18-79 yo.  Completed   Zoster (Shingles) Vaccine  Completed   HPV Vaccine  Aged Out   Colon Cancer Screening  Discontinued  *Topic was postponed. The date shown is not the original due date.

## 2022-05-13 NOTE — Progress Notes (Unsigned)
Cardiology Office Note   Date:  05/14/2022   ID:  Dhilan, Brauer 01/15/1941, MRN [id number]  PCP:  Inda Coke, PA  Cardiologist:   None Referring:  Inda Coke, [place]   Chief Complaint  Patient presents with   Chest Pain      History of Present Illness: Harold Leach is a 81 y.o. male who is referred by Inda Coke, [place] for evaluation of chest pain.    In 2021 a POET (Plain Old Exercise Treadmill) was negative for ischemia.  Echo in May demonstrated a normal EF with AS with a mean gradient of 36 mm.  DI 0.17, AVA 0.75 cm2.    He said he has been getting more fatigued.  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  It was a dull ache in his chest.  He was not really noticing it while he was doing the unloading but he was more fatigued.  He is getting more tired doing his normal activities.  He tries to walk for exercise and is getting a little more short of breath with this.  He is not describing PND or orthopnea.  He is not having new palpitations, presyncope or syncope.  He has not had any new weight gain or edema.  He does feel a general decrease in his exercise tolerance and he is a active gentleman.   Past Medical History:  Diagnosis Date   Acne rosacea 01/02/2017   Arthralgia of left shoulder region 01/02/2017   Chronic insomnia 01/02/2017   Chronic midline low back pain without sciatica 01/02/2017   Gastroesophageal reflux disease without esophagitis 01/02/2017   Hyperlipidemia 11/28/2015   Macular degeneration 01/02/2017   Prostate cancer [facility])     Past Surgical History:  Procedure Laterality Date   HERNIA REPAIR     PROSTATE SURGERY  2012     Current Outpatient Medications  Medication Sig Dispense Refill   aspirin EC 81 MG tablet Take 81 mg by mouth daily. Swallow whole.     Cholecalciferol (D3-1000 PO) Take by mouth. Every other day      CINNAMON PO Take 1,200 mg by mouth.     clobetasol cream (TEMOVATE) 0.05 % APPLY TOPICALLY TO THE AFFECTED AREA TWICE DAILY 45 g 1   Ginkgo Biloba (GINKOBA PO) 60 mg. Take two tablets twice daily     Melatonin 10 MG TABS Take by mouth.     mirtazapine (REMERON) 30 MG tablet Take 1 - 1.5 tablets at night 135 tablet 0   Misc Natural Products (NEURIVA PO) Take by mouth.     Multiple Vitamins-Minerals (ICAPS AREDS FORMULA PO) Take 2 capsules by mouth daily.      pantoprazole (PROTONIX) 40 MG tablet TAKE 1 TABLET(40 MG) BY MOUTH DAILY 90 tablet 3   Turmeric (QC TUMERIC COMPLEX PO) Take 1,000 mg by mouth.     vitamin B-12 (CYANOCOBALAMIN) 500 MCG tablet Take 1,000 mcg by mouth daily.      traZODone (DESYREL) 150 MG tablet TK 1 TO 1 AND 1/2 TS PO HS PRF SLP     No current facility-administered medications for this visit.    Allergies:   Patient has no known allergies.    ROS:  Please see the history of present illness.   Otherwise, review of systems are positive for none.   All other systems are reviewed and negative.  PHYSICAL EXAM: VS:  BP 115/78   Pulse (!) 53   Ht '5\' 9"'$  (1.753 m)   Wt 151 lb 12.8 oz (68.9 kg)   SpO2 98%   BMI 22.42 kg/m  , BMI Body mass index is 22.42 kg/m. GENERAL:  Well appearing NECK:  No jugular venous distention, waveform within normal limits, carotid upstroke decreased with parvus at tardus and symmetric, no bruits, no thyromegaly LUNGS:  Clear to auscultation bilaterally CHEST:  Unremarkable HEART:  PMI not displaced or sustained,S1 and S2 within normal limits, no S3, no S4, no clicks, no rubs, 3 out of 6 late peaking systolic murmur, no diastolic murmurs ABD:  Flat, positive bowel sounds normal in frequency in pitch, no bruits, no rebound, no guarding, no midline pulsatile mass, no hepatomegaly, no splenomegaly EXT:  2 plus pulses throughout, no edema, no cyanosis no clubbing   EKG:  EKG is not ordered today. The ekg ordered today demonstrates sinus  bradycardia, rate 47, axis within normal limits, intervals within normal limits, no acute ST-T wave changes.   Recent Labs: 07/24/2021: ALT 16 08/02/2021: BUN 31; Creatinine, Ser 1.36; Hemoglobin 13.5; Platelets 172.0; Potassium 4.5; Sodium 139    Lipid Panel    Component Value Date/Time   CHOL 181 07/24/2021 1008   TRIG 89.0 07/24/2021 1008   HDL 55.80 07/24/2021 1008   CHOLHDL 3 07/24/2021 1008   VLDL 17.8 07/24/2021 1008   LDLCALC 107 (H) 07/24/2021 1008   LDLCALC 98 07/23/2020 0936      Wt Readings from Last 3 Encounters:  05/14/22 151 lb 12.8 oz (68.9 kg)  05/12/22 160 lb (72.6 kg)  02/10/22 160 lb (72.6 kg)      Other studies Reviewed: Additional studies/ records that were reviewed today include: None Review of the above records demonstrates:  Please see elsewhere in the note.  NA   ASSESSMENT AND PLAN:   AS:  He has severe AS.  He is approaching the need for valve replacement and I think he be an excellent candidate for TAVR.  I am going to be evaluating his coronaries as below.    BRADYCARDIA:    He has a slow heart rate.  However, walking him around the office today and his heart rate went up to 73 when he walked around the office.  He was not particularly tired.  However, I will consider chronotropic incompetence if he has no improvement in his symptoms going forward.  DYSLIPIDEMIA: His LDL was 107 with an HDL of 55.8.  Goals of therapy will be based on his coronary anatomy.   CHEST PAIN: He had some exertional chest discomfort as described.  He will need to have his coronaries evaluated with cardiac catheterization.  The patient understands that risks included but are not limited to stroke (1 in 1000), death (1 in 4), kidney failure [usually temporary] (1 in 500), bleeding (1 in 200), allergic reaction [possibly serious] (1 in 200).  The patient understands and agrees to proceed.    Current medicines are reviewed at length with the patient today.  The patient  does not have concerns regarding medicines.  The following changes have been made: None  Labs/ tests ordered today include:   Orders Placed This Encounter  Procedures   CBC   Basic metabolic panel   EKG [id number]     Disposition:   FU with me after the catheterization and evaluation the possible TAVR   Signed, Minus Breeding, MD  05/14/2022 12:33 PM  [facility]

## 2022-05-14 ENCOUNTER — Ambulatory Visit (INDEPENDENT_AMBULATORY_CARE_PROVIDER_SITE_OTHER): Payer: Medicare Other | Admitting: Cardiology

## 2022-05-14 ENCOUNTER — Encounter: Payer: Self-pay | Admitting: Cardiology

## 2022-05-14 VITALS — BP 115/78 | HR 53 | Ht 69.0 in | Wt 151.8 lb

## 2022-05-14 DIAGNOSIS — E785 Hyperlipidemia, unspecified: Secondary | ICD-10-CM | POA: Diagnosis not present

## 2022-05-14 DIAGNOSIS — I350 Nonrheumatic aortic (valve) stenosis: Secondary | ICD-10-CM | POA: Diagnosis not present

## 2022-05-14 DIAGNOSIS — R001 Bradycardia, unspecified: Secondary | ICD-10-CM

## 2022-05-14 NOTE — Patient Instructions (Signed)
Medication Instructions:  No  changes  *If you need a refill on your cardiac medications before your next appointment, please call your pharmacy*   Lab Work:  Bmp  cbc If you have labs (blood work) drawn today and your tests are completely normal, you will receive your results only by: [place] (if you have MyChart) OR A paper copy in the mail If you have any lab test that is abnormal or we need to change your treatment, we will call you to review the results.   Testing/Procedures:  Schedule at [address] Your physician has requested that you have a cardiac catheterization. Cardiac catheterization is used to diagnose and/or treat various heart conditions. Doctors may recommend this procedure for a number of different reasons. The most common reason is to evaluate chest pain. Chest pain can be a symptom of coronary artery disease (CAD), and cardiac catheterization can show whether plaque is narrowing or blocking your heart's arteries. This procedure is also used to evaluate the valves, as well as measure the blood flow and oxygen levels in different parts of your heart. For further information please visit [email or web address]. Please follow instruction sheet, as given.    Follow-Up: At [facility], you and your health needs are our priority.  As part of our continuing mission to provide you with exceptional heart care, we have created designated Provider Care Teams.  These Care Teams include your primary Cardiologist (physician) and Advanced Practice Providers (APPs -  Physician Assistants and Nurse Practitioners) who all work together to provide you with the care you need, when you need it.  We recommend signing up for the patient portal called "MyChart".  Sign up information is provided on this After Visit Summary.  MyChart is used to connect with patients for Virtual Visits (Telemedicine).  Patients are able to view lab/test results, encounter notes, upcoming  appointments, etc.  Non-urgent messages can be sent to your provider as well.   To learn more about what you can do with MyChart, go to [email or web address].    Your next appointment:   6 week(s)  The format for your next appointment:   In Person  Provider:   Dr Percival Spanish      Other Instructions  [facility] CARDIOVASCULAR DIVISION [facility] [address] [place] [address] Dept: [phone] Loc: [place]  05/14/2022  You are scheduled for a Cardiac Catheterization on Friday, September 15 with Dr. Lenna Sciara.  1. Please arrive at the Main Entrance A at [facility]: [place], [place] [address] at 8:30 AM (This time is two hours before your procedure to ensure your preparation). Free valet parking service is available.   Special note: Every effort is made to have your procedure done on time. Please understand that emergencies sometimes delay scheduled procedures.  2. Diet: Do not eat solid foods after midnight.  You may have clear liquids until 5 AM upon the day of the procedure.  3. Labs: You will need to have blood drawn on CBC, BMP today . You do not need to be fasting.  4. Medication instructions in preparation for your procedure:   Contrast Allergy: No  On the morning of your procedure, take Aspirin  81 mg and any morning medicines NOT listed above.  You may use sips of water.  5. Plan to go home the same day, you will only stay overnight if medically necessary. 6. You MUST  have a responsible adult to drive you home. 7. An adult MUST be with you the first 24 hours after you arrive home. 8. Bring a current list of your medications, and the last time and date medication taken. 9. Bring ID and current insurance cards. 10.Please wear clothes that are easy to get on and off and wear slip-on shoes.  Thank you for allowing [place] to care for you!   -- [place]  Invasive Cardiovascular services    Important Information About Sugar

## 2022-05-15 LAB — CBC
Hematocrit: 40.5 % (ref 37.5–51.0)
Hemoglobin: 13.2 g/dL (ref 13.0–17.7)
MCH: 28.1 pg (ref 26.6–33.0)
MCHC: 32.6 g/dL (ref 31.5–35.7)
MCV: 86 fL (ref 79–97)
Platelets: 166 10*3/uL (ref 150–450)
RBC: 4.7 x10E6/uL (ref 4.14–5.80)
RDW: 16.1 % — ABNORMAL HIGH (ref 11.6–15.4)
WBC: 5.8 10*3/uL (ref 3.4–10.8)

## 2022-05-15 LAB — BASIC METABOLIC PANEL
BUN/Creatinine Ratio: 23 (ref 10–24)
BUN: 30 mg/dL — ABNORMAL HIGH (ref 8–27)
CO2: 26 mmol/L (ref 20–29)
Calcium: 9.8 mg/dL (ref 8.6–10.2)
Chloride: 101 mmol/L (ref 96–106)
Creatinine, Ser: 1.33 mg/dL — ABNORMAL HIGH (ref 0.76–1.27)
Glucose: 76 mg/dL (ref 70–99)
Potassium: 5.3 mmol/L — ABNORMAL HIGH (ref 3.5–5.2)
Sodium: 141 mmol/L (ref 134–144)
eGFR: 55 mL/min/{1.73_m2} — ABNORMAL LOW (ref >59)

## 2022-05-16 ENCOUNTER — Encounter: Payer: Self-pay | Admitting: Internal Medicine

## 2022-05-16 ENCOUNTER — Ambulatory Visit (INDEPENDENT_AMBULATORY_CARE_PROVIDER_SITE_OTHER): Payer: Medicare Other | Admitting: Internal Medicine

## 2022-05-16 VITALS — BP 112/66 | HR 61 | Ht 69.5 in | Wt 153.4 lb

## 2022-05-16 DIAGNOSIS — I350 Nonrheumatic aortic (valve) stenosis: Secondary | ICD-10-CM

## 2022-05-16 NOTE — H&P (View-Only) (Signed)
Patient ID: Harold Leach MRN: [id number] DOB/AGE: Nov 20, 1940 81 y.o.  Primary Care Physician:[facility], [place], [place] Primary Cardiologist: Marijo File, MD   FOCUSED CARDIOVASCULAR PROBLEM LIST:   1.  Severe aortic stenosis with an aortic valve area of 0.75 cm2, mean gradient of 36 mmHg, and peak velocity of 4.2 m/s without ejection fraction of 60 to 65%; no conduction abnormalities 2.  GERD  HISTORY OF PRESENT ILLNESS: The patient is a 81 y.o. male with the indicated medical history here for for recommendations regarding his severe aortic stenosis and chest discomfort.  He was recently seen by Dr. Percival Spanish and reported increasing fatigue, occasional chest discomfort, and dyspnea.  The patient tells me that he has been more short of breath of late.  This started about a year ago.  Its been fairly constant since then.  He denies any presyncope or syncope.  He denies any peripheral edema, paroxysmal nocturnal dyspnea, orthopnea.  He notices when he tries to work on his hobbies outside or go to swap meets he will be pretty short of breath with more than moderate exertion.  Last Sunday he had an episode of prolonged chest pain.  This was the day after he was at the swap meet moving a lot of heavy boxes around.  The following day he had a prolonged episode of chest pain that lasted several hours.  It went away on its own.  He did not seek urgent medical attention.  In terms of other issues the patient sees a dentist on a regular basis and reports excellent dental health.  Past Medical History:  Diagnosis Date   Acne rosacea 01/02/2017   Arthralgia of left shoulder region 01/02/2017   Chronic insomnia 01/02/2017   Chronic midline low back pain without sciatica 01/02/2017   Gastroesophageal reflux disease without esophagitis 01/02/2017   Hyperlipidemia 11/28/2015   Macular degeneration 01/02/2017   Prostate cancer [facility])     Past Surgical History:  Procedure Laterality Date   HERNIA  REPAIR     PROSTATE SURGERY  2012    Family History  Problem Relation Age of Onset   Heart disease Mother        CHF   Hypertension Mother    Prostate cancer Father    Heart disease Maternal Grandmother    Stroke Maternal Grandmother     Social History   Socioeconomic History   Marital status: Married    Spouse name: Not on file   Number of children: Not on file   Years of education: Not on file   Highest education level: Not on file  Occupational History   Occupation: Retired  Tobacco Use   Smoking status: Former    Types: Pipe    Quit date: 03/20/1981    Years since quitting: 41.1   Smokeless tobacco: Never   Tobacco comments:    Quit in 1983 pipe smoker   Substance and Sexual Activity   Alcohol use: No   Drug use: No   Sexual activity: Yes    Partners: Female  Other Topics Concern   Not on file  Social History Narrative   Not on file   Social Determinants of Health   Financial Resource Strain: Low Risk  (05/12/2022)   Overall Financial Resource Strain (CARDIA)    Difficulty of Paying Living Expenses: Not hard at all  Food Insecurity: No Food Insecurity (05/12/2022)   Hunger Vital Sign    Worried About Running Out of Food in the Last Year: Never  true    Ran Out of Food in the Last Year: Never true  Transportation Needs: No Transportation Needs (05/12/2022)   PRAPARE - Hydrologist (Medical): No    Lack of Transportation (Non-Medical): No  Physical Activity: Insufficiently Active (05/12/2022)   Exercise Vital Sign    Days of Exercise per Week: 7 days    Minutes of Exercise per Session: 20 min  Stress: No Stress Concern Present (05/12/2022)   [place]    Feeling of Stress : Not at all  Social Connections: Moderately Integrated (05/12/2022)   Social Connection and Isolation Panel [NHANES]    Frequency of Communication with Friends and Family: Once a week    Frequency  of Social Gatherings with Friends and Family: More than three times a week    Attends Religious Services: Never    Marine scientist or Organizations: Yes    Attends Archivist Meetings: 1 to 4 times per year    Marital Status: Married  Human resources officer Violence: Not At Risk (05/12/2022)   Humiliation, Afraid, Rape, and Kick questionnaire    Fear of Current or Ex-Partner: No    Emotionally Abused: No    Physically Abused: No    Sexually Abused: No     Prior to Admission medications   Medication Sig Start Date End Date Taking? Authorizing Provider  aspirin EC 81 MG tablet Take 81 mg by mouth daily. Swallow whole.    [provider]  Cholecalciferol (D3-1000 PO) Take by mouth. Every other day    [provider]  CINNAMON PO Take 1,200 mg by mouth.    [provider]  clobetasol cream (TEMOVATE) 0.05 % APPLY TOPICALLY TO THE AFFECTED AREA TWICE DAILY 03/03/22   Inda Coke, PA  Ginkgo Biloba (GINKOBA PO) 60 mg. Take two tablets twice daily    [provider]  Melatonin 10 MG TABS Take by mouth.    [provider]  mirtazapine (REMERON) 30 MG tablet Take 1 - 1.5 tablets at night 03/26/22   Morene Rankins, [place], [place]  Misc Natural Products (NEURIVA PO) Take by mouth.    [provider]  Multiple Vitamins-Minerals (ICAPS AREDS FORMULA PO) Take 2 capsules by mouth daily.     [provider]  pantoprazole (PROTONIX) 40 MG tablet TAKE 1 TABLET(40 MG) BY MOUTH DAILY 02/25/22   Inda Coke, PA  traZODone (DESYREL) 150 MG tablet TK 1 TO 1 AND 1/2 TS PO HS PRF SLP    [provider]  Turmeric (QC TUMERIC COMPLEX PO) Take 1,000 mg by mouth.    [provider]  vitamin B-12 (CYANOCOBALAMIN) 500 MCG tablet Take 1,000 mcg by mouth daily.     [provider]    No Known Allergies  REVIEW OF SYSTEMS:  General: no fevers/chills/night sweats Eyes: no blurry vision, diplopia, or amaurosis ENT: no sore  throat or hearing loss Resp: no cough, wheezing, or hemoptysis CV: no edema or palpitations GI: no abdominal pain, nausea, vomiting, diarrhea, or constipation GU: no dysuria, frequency, or hematuria Skin: no rash Neuro: no headache, numbness, tingling, or weakness of extremities Musculoskeletal: no joint pain or swelling Heme: no bleeding, DVT, or easy bruising Endo: no polydipsia or polyuria  BP 112/66   Pulse 61   Ht 5' 9.5" (1.765 m)   Wt 153 lb 6.4 oz (69.6 kg)   SpO2 95%   BMI 22.33 kg/m  PHYSICAL EXAM: GEN:  AO x 3 in no acute distress HEENT: normal Dentition: Normal Neck: JVP normal. +2carotid upstrokes without bruits. No thyromegaly. Lungs: equal expansion, clear bilaterally CV: Apex is discrete and nondisplaced, RRR with 3 out of 6 systolic murmur Abd: soft, non-tender, non-distended; no bruit; positive bowel sounds Ext: no edema, ecchymoses, or cyanosis Vascular: 2+ femoral pulses, 2+ radial pulses       Skin: warm and dry without rash Neuro: CN II-XII grossly intact; motor and sensory grossly intact    DATA AND STUDIES:  EKG: August 2023 sinus bradycardia without conduction abnormalities  2D ECHO: August 2023 heavily calcified aortic valve with indices consistent with severe aortic stenosis with preserved ejection fraction and no significant valve abnormalities  CARDIAC CATH: Pending  STS RISK CALCULATOR: Pending  NHYA CLASS: 2    ASSESSMENT AND PLAN:   Aortic stenosis, severe  The patient has developed severe symptomatic aortic stenosis stage D1.  He fortunately is not having any high risk symptomatology.  He reports good dental health.  He is scheduled for cardiac catheterization in the coming weeks.  I will refer him for CT scan and cardiothoracic surgical opinion.  Further recommendations will be issued following review of all of this testing.  Of note given the patient's anginal symptoms we will consider treating significant obstructive coronary  artery disease on cardiac catheterization with PCI.  I have personally reviewed the patients imaging data as summarized above.  I have reviewed the natural history of aortic stenosis with the patient and family members who are present today. We have discussed the limitations of medical therapy and the poor prognosis associated with symptomatic aortic stenosis. We have also reviewed potential treatment options, including palliative medical therapy, conventional surgical aortic valve replacement, and transcatheter aortic valve replacement. We discussed treatment options in the context of this patient's specific comorbid medical conditions.   All of the patient's questions were answered today. Will make further recommendations based on the results of studies outlined above.   Total time spent with patient today 60 minutes. This includes reviewing records, evaluating the patient and coordinating care.   Early Osmond, MD  05/19/2022 6:52 AM    [facility] [place], [place], [place]  [address] Phone: [phone]; Fax: [phone]

## 2022-05-16 NOTE — Progress Notes (Addendum)
Pre Surgical Assessment: 5 M Walk Test  43M=16.68f  5 Meter Walk Test- trial 1: 4.42 seconds 5 Meter Walk Test- trial 2: 4.53 seconds 5 Meter Walk Test- trial 3: 4.48 seconds 5 Meter Walk Test Average: 4.48 seconds    Procedure Type: Isolated AVR Perioperative Outcome Estimate % Operative Mortality 1.02% Morbidity & Mortality 4.61% Stroke 0.851% Renal Failure 0.476% Reoperation 3.43% Prolonged Ventilation 1.58% Deep Sternal Wound Infection 0.027% [facility] (>14 days) 1.83% Short Hospital Stay (<6 days)* 62.5%

## 2022-05-16 NOTE — Patient Instructions (Signed)
Medication Instructions:  NO CHANGES *If you need a refill on your cardiac medications before your next appointment, please call your pharmacy*   Lab Work: NONE If you have labs (blood work) drawn today and your tests are completely normal, you will receive your results only by: Central (if you have MyChart) OR A paper copy in the mail If you have any lab test that is abnormal or we need to change your treatment, we will call you to review the results.   Testing/Procedures: NONE   Follow-Up: At [facility], you and your health needs are our priority.  As part of our continuing mission to provide you with exceptional heart care, we have created designated Provider Care Teams.  These Care Teams include your primary Cardiologist (physician) and Advanced Practice Providers (APPs -  Physician Assistants and Nurse Practitioners) who all work together to provide you with the care you need, when you need it.  We recommend signing up for the patient portal called "MyChart".  Sign up information is provided on this After Visit Summary.  MyChart is used to connect with patients for Virtual Visits (Telemedicine).  Patients are able to view lab/test results, encounter notes, upcoming appointments, etc.  Non-urgent messages can be sent to your provider as well.   To learn more about what you can do with MyChart, go to [email or web address].    Your next appointment:   PENDING PROCEDURE   The format for your next appointment:     Provider:       Other Instructions NONE  Important Information About Sugar

## 2022-05-16 NOTE — Progress Notes (Unsigned)
Patient ID: Harold Leach MRN: [id number] DOB/AGE: 06-02-1941 81 y.o.  Primary Care Physician:[facility], [place], [place] Primary Cardiologist: Marijo File, MD   FOCUSED CARDIOVASCULAR PROBLEM LIST:   1.  Severe aortic stenosis with an aortic valve area of 0.75 cm2, mean gradient of 36 mmHg, and peak velocity of 4.2 m/s without ejection fraction of 60 to 65%; no conduction abnormalities 2.  GERD  HISTORY OF PRESENT ILLNESS: The patient is a 81 y.o. male with the indicated medical history here for for recommendations regarding his severe aortic stenosis and chest discomfort.  He was recently seen by Dr. Percival Spanish and reported increasing fatigue, occasional chest discomfort, and dyspnea.  The patient tells me that he has been more short of breath of late.  This started about a year ago.  Its been fairly constant since then.  He denies any presyncope or syncope.  He denies any peripheral edema, paroxysmal nocturnal dyspnea, orthopnea.  He notices when he tries to work on his hobbies outside or go to swap meets he will be pretty short of breath with more than moderate exertion.  Last Sunday he had an episode of prolonged chest pain.  This was the day after he was at the swap meet moving a lot of heavy boxes around.  The following day he had a prolonged episode of chest pain that lasted several hours.  It went away on its own.  He did not seek urgent medical attention.  In terms of other issues the patient sees a dentist on a regular basis and reports excellent dental health.  Past Medical History:  Diagnosis Date   Acne rosacea 01/02/2017   Arthralgia of left shoulder region 01/02/2017   Chronic insomnia 01/02/2017   Chronic midline low back pain without sciatica 01/02/2017   Gastroesophageal reflux disease without esophagitis 01/02/2017   Hyperlipidemia 11/28/2015   Macular degeneration 01/02/2017   Prostate cancer [facility])     Past Surgical History:  Procedure Laterality Date   HERNIA  REPAIR     PROSTATE SURGERY  2012    Family History  Problem Relation Age of Onset   Heart disease Mother        CHF   Hypertension Mother    Prostate cancer Father    Heart disease Maternal Grandmother    Stroke Maternal Grandmother     Social History   Socioeconomic History   Marital status: Married    Spouse name: Not on file   Number of children: Not on file   Years of education: Not on file   Highest education level: Not on file  Occupational History   Occupation: Retired  Tobacco Use   Smoking status: Former    Types: Pipe    Quit date: 03/20/1981    Years since quitting: 41.1   Smokeless tobacco: Never   Tobacco comments:    Quit in 1983 pipe smoker   Substance and Sexual Activity   Alcohol use: No   Drug use: No   Sexual activity: Yes    Partners: Female  Other Topics Concern   Not on file  Social History Narrative   Not on file   Social Determinants of Health   Financial Resource Strain: Low Risk  (05/12/2022)   Overall Financial Resource Strain (CARDIA)    Difficulty of Paying Living Expenses: Not hard at all  Food Insecurity: No Food Insecurity (05/12/2022)   Hunger Vital Sign    Worried About Running Out of Food in the Last Year: Never  true    Ran Out of Food in the Last Year: Never true  Transportation Needs: No Transportation Needs (05/12/2022)   PRAPARE - Hydrologist (Medical): No    Lack of Transportation (Non-Medical): No  Physical Activity: Insufficiently Active (05/12/2022)   Exercise Vital Sign    Days of Exercise per Week: 7 days    Minutes of Exercise per Session: 20 min  Stress: No Stress Concern Present (05/12/2022)   [place]    Feeling of Stress : Not at all  Social Connections: Moderately Integrated (05/12/2022)   Social Connection and Isolation Panel [NHANES]    Frequency of Communication with Friends and Family: Once a week    Frequency  of Social Gatherings with Friends and Family: More than three times a week    Attends Religious Services: Never    Marine scientist or Organizations: Yes    Attends Archivist Meetings: 1 to 4 times per year    Marital Status: Married  Human resources officer Violence: Not At Risk (05/12/2022)   Humiliation, Afraid, Rape, and Kick questionnaire    Fear of Current or Ex-Partner: No    Emotionally Abused: No    Physically Abused: No    Sexually Abused: No     Prior to Admission medications   Medication Sig Start Date End Date Taking? Authorizing Provider  aspirin EC 81 MG tablet Take 81 mg by mouth daily. Swallow whole.    [provider]  Cholecalciferol (D3-1000 PO) Take by mouth. Every other day    [provider]  CINNAMON PO Take 1,200 mg by mouth.    [provider]  clobetasol cream (TEMOVATE) 0.05 % APPLY TOPICALLY TO THE AFFECTED AREA TWICE DAILY 03/03/22   Inda Coke, PA  Ginkgo Biloba (GINKOBA PO) 60 mg. Take two tablets twice daily    [provider]  Melatonin 10 MG TABS Take by mouth.    [provider]  mirtazapine (REMERON) 30 MG tablet Take 1 - 1.5 tablets at night 03/26/22   Morene Rankins, [place], [place]  Misc Natural Products (NEURIVA PO) Take by mouth.    [provider]  Multiple Vitamins-Minerals (ICAPS AREDS FORMULA PO) Take 2 capsules by mouth daily.     [provider]  pantoprazole (PROTONIX) 40 MG tablet TAKE 1 TABLET(40 MG) BY MOUTH DAILY 02/25/22   Inda Coke, PA  traZODone (DESYREL) 150 MG tablet TK 1 TO 1 AND 1/2 TS PO HS PRF SLP    [provider]  Turmeric (QC TUMERIC COMPLEX PO) Take 1,000 mg by mouth.    [provider]  vitamin B-12 (CYANOCOBALAMIN) 500 MCG tablet Take 1,000 mcg by mouth daily.     [provider]    No Known Allergies  REVIEW OF SYSTEMS:  General: no fevers/chills/night sweats Eyes: no blurry vision, diplopia, or amaurosis ENT: no sore  throat or hearing loss Resp: no cough, wheezing, or hemoptysis CV: no edema or palpitations GI: no abdominal pain, nausea, vomiting, diarrhea, or constipation GU: no dysuria, frequency, or hematuria Skin: no rash Neuro: no headache, numbness, tingling, or weakness of extremities Musculoskeletal: no joint pain or swelling Heme: no bleeding, DVT, or easy bruising Endo: no polydipsia or polyuria  BP 112/66   Pulse 61   Ht 5' 9.5" (1.765 m)   Wt 153 lb 6.4 oz (69.6 kg)   SpO2 95%   BMI 22.33 kg/m  PHYSICAL EXAM: GEN:  AO x 3 in no acute distress HEENT: normal Dentition: Normal Neck: JVP normal. +2carotid upstrokes without bruits. No thyromegaly. Lungs: equal expansion, clear bilaterally CV: Apex is discrete and nondisplaced, RRR with 3 out of 6 systolic murmur Abd: soft, non-tender, non-distended; no bruit; positive bowel sounds Ext: no edema, ecchymoses, or cyanosis Vascular: 2+ femoral pulses, 2+ radial pulses       Skin: warm and dry without rash Neuro: CN II-XII grossly intact; motor and sensory grossly intact    DATA AND STUDIES:  EKG: August 2023 sinus bradycardia without conduction abnormalities  2D ECHO: August 2023 heavily calcified aortic valve with indices consistent with severe aortic stenosis with preserved ejection fraction and no significant valve abnormalities  CARDIAC CATH: Pending  STS RISK CALCULATOR: Pending  NHYA CLASS: 2    ASSESSMENT AND PLAN:   Aortic stenosis, severe  The patient has developed severe symptomatic aortic stenosis stage D1.  He fortunately is not having any high risk symptomatology.  He reports good dental health.  He is scheduled for cardiac catheterization in the coming weeks.  I will refer him for CT scan and cardiothoracic surgical opinion.  Further recommendations will be issued following review of all of this testing.  Of note given the patient's anginal symptoms we will consider treating significant obstructive coronary  artery disease on cardiac catheterization with PCI.  I have personally reviewed the patients imaging data as summarized above.  I have reviewed the natural history of aortic stenosis with the patient and family members who are present today. We have discussed the limitations of medical therapy and the poor prognosis associated with symptomatic aortic stenosis. We have also reviewed potential treatment options, including palliative medical therapy, conventional surgical aortic valve replacement, and transcatheter aortic valve replacement. We discussed treatment options in the context of this patient's specific comorbid medical conditions.   All of the patient's questions were answered today. Will make further recommendations based on the results of studies outlined above.   Total time spent with patient today 60 minutes. This includes reviewing records, evaluating the patient and coordinating care.   Early Osmond, MD  05/19/2022 6:52 AM    [facility] [place], [place], [place]  [address] Phone: [phone]; Fax: [phone]

## 2022-05-19 ENCOUNTER — Other Ambulatory Visit: Payer: Self-pay | Admitting: *Deleted

## 2022-05-19 ENCOUNTER — Encounter: Payer: Self-pay | Admitting: Internal Medicine

## 2022-05-19 DIAGNOSIS — I350 Nonrheumatic aortic (valve) stenosis: Secondary | ICD-10-CM

## 2022-05-20 ENCOUNTER — Telehealth: Payer: Self-pay | Admitting: Cardiology

## 2022-05-20 NOTE — Telephone Encounter (Signed)
Pt updated with lab results along with MD's recommendation. Pt also made aware to limit food high in potassium. Pt verbalized understanding.    Minus Breeding, MD  05/15/2022  1:17 PM EDT     Potassium was mildly elevated.  He could have a repeat i-STAT when he has his procedure.  Creatinine is mildly elevated but this is unchanged from previous.  Call Mr. Vecchio with the results and send results to Inda Coke, [place]

## 2022-05-20 NOTE — Telephone Encounter (Signed)
Patient called back to get results. 

## 2022-05-23 ENCOUNTER — Other Ambulatory Visit: Payer: Self-pay | Admitting: Family

## 2022-06-02 ENCOUNTER — Other Ambulatory Visit: Payer: Self-pay

## 2022-06-05 ENCOUNTER — Other Ambulatory Visit: Payer: Self-pay | Admitting: Physician Assistant

## 2022-06-05 ENCOUNTER — Telehealth: Payer: Self-pay | Admitting: *Deleted

## 2022-06-05 ENCOUNTER — Encounter: Payer: Self-pay | Admitting: Physician Assistant

## 2022-06-05 DIAGNOSIS — I350 Nonrheumatic aortic (valve) stenosis: Secondary | ICD-10-CM

## 2022-06-05 NOTE — Telephone Encounter (Signed)
Cardiac Catheterization scheduled at [facility] for: Friday June 06, 2022 10:30 AM Arrival time and place: [place] Entrance A at: 8-8:30 AM-needs BMP  Nothing to eat after midnight prior to procedure, clear liquids until 5 AM day of procedure.  Medication instructions: -Usual morning medications can be taken with sips of water including aspirin 81 mg.  Confirmed patient has responsible adult to drive home post procedure and be with patient first 24 hours after arriving home.  Patient reports no new symptoms concerning for COVID-19 in the past 10 days.  Reviewed procedure instructions with patient.

## 2022-06-06 ENCOUNTER — Other Ambulatory Visit: Payer: Self-pay

## 2022-06-06 ENCOUNTER — Ambulatory Visit (HOSPITAL_COMMUNITY)
Admission: RE | Admit: 2022-06-06 | Discharge: 2022-06-06 | Disposition: A | Discharge status: Home / Self Care | Payer: Medicare Other | Attending: Internal Medicine | Admitting: Internal Medicine

## 2022-06-06 ENCOUNTER — Encounter (HOSPITAL_COMMUNITY)
Admission: RE | Disposition: A | Discharge status: Home / Self Care | Payer: Self-pay | Source: Home / Self Care | Attending: Internal Medicine

## 2022-06-06 DIAGNOSIS — E875 Hyperkalemia: Secondary | ICD-10-CM

## 2022-06-06 DIAGNOSIS — I350 Nonrheumatic aortic (valve) stenosis: Secondary | ICD-10-CM | POA: Diagnosis not present

## 2022-06-06 DIAGNOSIS — Z01812 Encounter for preprocedural laboratory examination: Secondary | ICD-10-CM

## 2022-06-06 DIAGNOSIS — K219 Gastro-esophageal reflux disease without esophagitis: Secondary | ICD-10-CM | POA: Diagnosis not present

## 2022-06-06 HISTORY — PX: ABDOMINAL AORTOGRAM: CATH118222

## 2022-06-06 HISTORY — PX: RIGHT/LEFT HEART CATH AND CORONARY ANGIOGRAPHY: CATH118266

## 2022-06-06 LAB — POCT I-STAT EG7
Acid-Base Excess: 0 mmol/L (ref 0.0–2.0)
Acid-base deficit: 1 mmol/L (ref 0.0–2.0)
Bicarbonate: 25 mmol/L (ref 20.0–28.0)
Bicarbonate: 26.4 mmol/L (ref 20.0–28.0)
Calcium, Ion: 1.14 mmol/L — ABNORMAL LOW (ref 1.15–1.40)
Calcium, Ion: 1.24 mmol/L (ref 1.15–1.40)
HCT: 34 % — ABNORMAL LOW (ref 39.0–52.0)
HCT: 35 % — ABNORMAL LOW (ref 39.0–52.0)
Hemoglobin: 11.6 g/dL — ABNORMAL LOW (ref 13.0–17.0)
Hemoglobin: 11.9 g/dL — ABNORMAL LOW (ref 13.0–17.0)
O2 Saturation: 69 %
O2 Saturation: 71 %
Potassium: 3.9 mmol/L (ref 3.5–5.1)
Potassium: 4.2 mmol/L (ref 3.5–5.1)
Sodium: 142 mmol/L (ref 135–145)
Sodium: 144 mmol/L (ref 135–145)
TCO2: 26 mmol/L (ref 22–32)
TCO2: 28 mmol/L (ref 22–32)
pCO2, Ven: 45.6 mmHg (ref 44–60)
pCO2, Ven: 49.4 mmHg (ref 44–60)
pH, Ven: 7.336 (ref 7.25–7.43)
pH, Ven: 7.346 (ref 7.25–7.43)
pO2, Ven: 38 mmHg (ref 32–45)
pO2, Ven: 40 mmHg (ref 32–45)

## 2022-06-06 LAB — POCT I-STAT 7, (LYTES, BLD GAS, ICA,H+H)
Acid-base deficit: 1 mmol/L (ref 0.0–2.0)
Bicarbonate: 23.8 mmol/L (ref 20.0–28.0)
Calcium, Ion: 1.22 mmol/L (ref 1.15–1.40)
HCT: 35 % — ABNORMAL LOW (ref 39.0–52.0)
Hemoglobin: 11.9 g/dL — ABNORMAL LOW (ref 13.0–17.0)
O2 Saturation: 97 %
Potassium: 4.1 mmol/L (ref 3.5–5.1)
Sodium: 142 mmol/L (ref 135–145)
TCO2: 25 mmol/L (ref 22–32)
pCO2 arterial: 38.1 mmHg (ref 32–48)
pH, Arterial: 7.403 (ref 7.35–7.45)
pO2, Arterial: 87 mmHg (ref 83–108)

## 2022-06-06 LAB — BASIC METABOLIC PANEL
Anion gap: 5 (ref 5–15)
BUN: 24 mg/dL — ABNORMAL HIGH (ref 8–23)
CO2: 29 mmol/L (ref 22–32)
Calcium: 9.1 mg/dL (ref 8.9–10.3)
Chloride: 107 mmol/L (ref 98–111)
Creatinine, Ser: 1.38 mg/dL — ABNORMAL HIGH (ref 0.61–1.24)
GFR, Estimated: 52 mL/min — ABNORMAL LOW (ref >60)
Glucose, Bld: 82 mg/dL (ref 70–99)
Potassium: 4.1 mmol/L (ref 3.5–5.1)
Sodium: 141 mmol/L (ref 135–145)

## 2022-06-06 SURGERY — RIGHT/LEFT HEART CATH AND CORONARY ANGIOGRAPHY
Anesthesia: LOCAL

## 2022-06-06 MED ORDER — SODIUM CHLORIDE 0.9% FLUSH: 3.0000 mL | Freq: Two times a day (BID) | INTRAVENOUS | Status: DC

## 2022-06-06 MED ORDER — ACETAMINOPHEN 325 MG PO TABS: 650.0000 mg | ORAL_TABLET | ORAL | Status: DC | PRN

## 2022-06-06 MED ORDER — SODIUM CHLORIDE 0.9 % WEIGHT BASED INFUSION
3.0000 mL/kg/h | INTRAVENOUS | Status: AC
  Administered 2022-06-06: 3 mL/kg/h via INTRAVENOUS

## 2022-06-06 MED ORDER — SODIUM CHLORIDE 0.9% FLUSH: 3.0000 mL | INTRAVENOUS | Status: DC | PRN

## 2022-06-06 MED ORDER — HEPARIN SODIUM (PORCINE) 1000 UNIT/ML IJ SOLN
INTRAMUSCULAR | Status: AC
  Filled 2022-06-06: qty 10

## 2022-06-06 MED ORDER — SODIUM CHLORIDE 0.9 % IV SOLN: 250.0000 mL | INTRAVENOUS | Status: DC | PRN

## 2022-06-06 MED ORDER — MIDAZOLAM HCL 2 MG/2ML IJ SOLN
INTRAMUSCULAR | Status: DC | PRN
  Administered 2022-06-06: 1 mg via INTRAVENOUS

## 2022-06-06 MED ORDER — IOHEXOL 350 MG/ML SOLN
INTRAVENOUS | Status: DC | PRN
  Administered 2022-06-06: 60 mL

## 2022-06-06 MED ORDER — SODIUM CHLORIDE 0.9 % WEIGHT BASED INFUSION: 1.0000 mL/kg/h | INTRAVENOUS | Status: DC

## 2022-06-06 MED ORDER — HEPARIN (PORCINE) IN NACL 1000-0.9 UT/500ML-% IV SOLN
INTRAVENOUS | Status: AC
  Filled 2022-06-06: qty 1000

## 2022-06-06 MED ORDER — FENTANYL CITRATE (PF) 100 MCG/2ML IJ SOLN
INTRAMUSCULAR | Status: AC
  Filled 2022-06-06: qty 2

## 2022-06-06 MED ORDER — VERAPAMIL HCL 2.5 MG/ML IV SOLN: INTRAVENOUS | Status: DC | PRN

## 2022-06-06 MED ORDER — LIDOCAINE HCL (PF) 1 % IJ SOLN
INTRAMUSCULAR | Status: DC | PRN
  Administered 2022-06-06: 4 mL

## 2022-06-06 MED ORDER — FENTANYL CITRATE (PF) 100 MCG/2ML IJ SOLN
INTRAMUSCULAR | Status: DC | PRN
  Administered 2022-06-06: 25 ug via INTRAVENOUS

## 2022-06-06 MED ORDER — MIDAZOLAM HCL 2 MG/2ML IJ SOLN
INTRAMUSCULAR | Status: AC
  Filled 2022-06-06: qty 2

## 2022-06-06 MED ORDER — HEPARIN SODIUM (PORCINE) 1000 UNIT/ML IJ SOLN
INTRAMUSCULAR | Status: DC | PRN
  Administered 2022-06-06: 5000 [IU] via INTRAVENOUS

## 2022-06-06 MED ORDER — LABETALOL HCL 5 MG/ML IV SOLN: 10.0000 mg | INTRAVENOUS | Status: DC | PRN

## 2022-06-06 MED ORDER — ONDANSETRON HCL 4 MG/2ML IJ SOLN: 4.0000 mg | Freq: Four times a day (QID) | INTRAMUSCULAR | Status: DC | PRN

## 2022-06-06 MED ORDER — LIDOCAINE HCL (PF) 1 % IJ SOLN
INTRAMUSCULAR | Status: AC
  Filled 2022-06-06: qty 30

## 2022-06-06 MED ORDER — HEPARIN (PORCINE) IN NACL 1000-0.9 UT/500ML-% IV SOLN
INTRAVENOUS | Status: DC | PRN
  Administered 2022-06-06 (×2): 500 mL

## 2022-06-06 MED ORDER — HYDRALAZINE HCL 20 MG/ML IJ SOLN: 10.0000 mg | INTRAMUSCULAR | Status: DC | PRN

## 2022-06-06 MED ORDER — VERAPAMIL HCL 2.5 MG/ML IV SOLN
INTRAVENOUS | Status: AC
  Filled 2022-06-06: qty 2

## 2022-06-06 MED ORDER — SODIUM CHLORIDE 0.9 % IV SOLN: INTRAVENOUS | Status: DC

## 2022-06-06 SURGICAL SUPPLY — 17 items
CATH BALLN WEDGE 5F 110CM (CATHETERS) IMPLANT
CATH DIAG 6FR JL4 (CATHETERS) IMPLANT
CATH DIAG 6FR JR4 (CATHETERS) IMPLANT
CATH DIAG 6FR PIGTAIL ANGLED (CATHETERS) IMPLANT
CATH OPTITORQUE TIG 4.0 6F (CATHETERS) IMPLANT
DEVICE RAD COMP TR BAND LRG (VASCULAR PRODUCTS) IMPLANT
GLIDESHEATH SLEND SS 6F .021 (SHEATH) IMPLANT
GUIDEWIRE INQWIRE 1.5J.035X260 (WIRE) IMPLANT
INQWIRE 1.5J .035X260CM (WIRE) ×1
KIT HEART LEFT (KITS) ×1 IMPLANT
PACK CARDIAC CATHETERIZATION (CUSTOM PROCEDURE TRAY) ×1 IMPLANT
SHEATH GLIDE SLENDER 4/5FR (SHEATH) IMPLANT
SYR MEDRAD MARK 7 150ML (SYRINGE) ×1 IMPLANT
SYR MEDRAD MARK V 150ML (SYRINGE) IMPLANT
TRANSDUCER W/STOPCOCK (MISCELLANEOUS) ×1 IMPLANT
TUBING CIL FLEX 10 FLL-RA (TUBING) ×1 IMPLANT
TUBING CONTRAST HIGH PRESS 48 (TUBING) IMPLANT

## 2022-06-06 NOTE — Discharge Instructions (Signed)

## 2022-06-06 NOTE — Interval H&P Note (Signed)
History and Physical Interval Note:  06/06/2022 9:05 AM  Harold Leach  has presented today for surgery, with the diagnosis of aortic stenosis.  The various methods of treatment have been discussed with the patient and family. After consideration of risks, benefits and other options for treatment, the patient has consented to  Procedure(s): RIGHT/LEFT HEART CATH AND CORONARY ANGIOGRAPHY (N/A) as a surgical intervention.  The patient's history has been reviewed, patient examined, no change in status, stable for surgery.  I have reviewed the patient's chart and labs.  Questions were answered to the patient's satisfaction.     Early Osmond

## 2022-06-09 ENCOUNTER — Encounter (HOSPITAL_COMMUNITY): Payer: Self-pay | Admitting: Internal Medicine

## 2022-06-10 NOTE — Progress Notes (Unsigned)
Cardiology Office Note   Date:  06/11/2022   ID:  Antoni, Stefan [birth year], MRN [id number]  PCP:  Inda Coke, PA  Cardiologist:   Minus Breeding, MD Referring:  Inda Coke, PA   Chief Complaint  Patient presents with   Shortness of Breath      History of Present Illness: Harold Leach is a 81 y.o. male who is referred by Inda Coke, [place] for evaluation of chest pain.    In 2021 a POET (Plain Old Exercise Treadmill) was negative for ischemia.  Echo in May demonstrated a normal EF with AS with a mean gradient of 36 mm.  DI 0.17, AVA 0.75 cm2.    He is being evaluated for TAVR.  Cath demonstrated normal coronaries.    Since I last saw him he has done well.  He has not done anything particularly strenuous like to the swap meet with his rebuilt carburetor's.  However, his usual activity is not causing him any symptoms.  He has had previous fatigue and exertional chest pain.    Past Medical History:  Diagnosis Date   Acne rosacea 01/02/2017   Arthralgia of left shoulder region 01/02/2017   Chronic insomnia 01/02/2017   Chronic midline low back pain without sciatica 01/02/2017   Gastroesophageal reflux disease without esophagitis 01/02/2017   Hyperlipidemia 11/28/2015   Macular degeneration 01/02/2017   Prostate cancer [facility])     Past Surgical History:  Procedure Laterality Date   ABDOMINAL AORTOGRAM N/A 06/06/2022   Procedure: ABDOMINAL AORTOGRAM;  Surgeon: Early Osmond, MD;  Location: [place] [facility];  Service: Cardiovascular;  Laterality: N/A;   HERNIA REPAIR     PROSTATE SURGERY  2012   RIGHT/LEFT HEART CATH AND CORONARY ANGIOGRAPHY N/A 06/06/2022   Procedure: RIGHT/LEFT HEART CATH AND CORONARY ANGIOGRAPHY;  Surgeon: Early Osmond, MD;  Location: [place] [facility];  Service: Cardiovascular;  Laterality: N/A;     Current Outpatient Medications  Medication Sig Dispense Refill   aspirin EC 81 MG tablet Take 81 mg by mouth daily.  Swallow whole.     Cholecalciferol (D3-1000 PO) Take 1,000 Units by mouth every other day.     CINNAMON PO Take 1,200 mg by mouth daily.     clobetasol cream (TEMOVATE) 0.05 % APPLY TOPICALLY TO THE AFFECTED AREA TWICE DAILY (Patient taking differently: Apply 1 Application topically 2 (two) times daily as needed (irritation).) 45 g 1   cyanocobalamin (VITAMIN B12) 1000 MCG tablet Take 1,000 mcg by mouth daily.     Ginkgo Biloba 60 MG CAPS Take 60 mg by mouth daily.     Melatonin 10 MG TABS Take 10 mg by mouth at bedtime.     mirtazapine (REMERON) 30 MG tablet Take 1 - 1.5 tablets at night (Patient taking differently: Take 30 mg by mouth at bedtime.) 135 tablet 0   Multiple Vitamins-Minerals (PRESERVISION AREDS 2+MULTI VIT PO) Take 2 capsules by mouth daily.     OVER THE COUNTER MEDICATION Take 4 tablets by mouth daily. Acuitol (Advanced Mental Acuity Support)     pantoprazole (PROTONIX) 40 MG tablet TAKE 1 TABLET(40 MG) BY MOUTH DAILY 90 tablet 3   traZODone (DESYREL) 150 MG tablet Take 50 mg by mouth at bedtime.     Turmeric (QC TUMERIC COMPLEX PO) Take 1,000 mg by mouth daily.     No current facility-administered medications for this visit.    Allergies:   Patient has no known allergies.  ROS:  Please see the history of present illness.   Otherwise, review of systems are positive for none.   All other systems are reviewed and negative.    PHYSICAL EXAM: VS:  BP 128/76   Pulse (!) 53   Ht '5\' 9"'$  (1.753 m)   Wt 154 lb 12.8 oz (70.2 kg)   SpO2 98%   BMI 22.86 kg/m  , BMI Body mass index is 22.86 kg/m. GENERAL:  Well appearing NECK:  No jugular venous distention, waveform within normal limits, carotid upstroke brisk and symmetric, no bruits, no thyromegaly LUNGS:  Clear to auscultation bilaterally CHEST:  Unremarkable HEART:  PMI not displaced or sustained,S1 and S2 within normal limits, no S3, no S4, no clicks, no rubs, 3 of 6 apical systolic murmur radiating out the aortic  outflow tract and late peaking, no diastolic murmurs ABD:  Flat, positive bowel sounds normal in frequency in pitch, no bruits, no rebound, no guarding, no midline pulsatile mass, no hepatomegaly, no splenomegaly EXT:  2 plus pulses throughout, no edema, no cyanosis no clubbing, large right radial ecchymosis without thrill or bruit   EKG:  EKG is  ordered today. The ekg ordered today demonstrates sinus bradycardia, rate 53, axis within normal limits, intervals within normal limits, no acute ST-T wave changes.   Recent Labs: 07/24/2021: ALT 16 05/14/2022: Platelets 166 06/06/2022: BUN 24; Creatinine, Ser 1.38; Hemoglobin 11.6; Potassium 3.9; Sodium 144    Lipid Panel    Component Value Date/Time   CHOL 181 07/24/2021 1008   TRIG 89.0 07/24/2021 1008   HDL 55.80 07/24/2021 1008   CHOLHDL 3 07/24/2021 1008   VLDL 17.8 07/24/2021 1008   LDLCALC 107 (H) 07/24/2021 1008   LDLCALC 98 07/23/2020 0936      Wt Readings from Last 3 Encounters:  06/11/22 154 lb 12.8 oz (70.2 kg)  06/06/22 150 lb (68 kg)  05/16/22 153 lb 6.4 oz (69.6 kg)      Other studies Reviewed: Additional studies/ records that were reviewed today include: Cardiac cath Review of the above records demonstrates:  Please see elsewhere in the note.  NA   ASSESSMENT AND PLAN:   AS:  He is being evaluated for TAVR.  No change in therapy.   BRADYCARDIA:    He has a slow heart rate.   He tolerates this.  No change in therapy.  DYSLIPIDEMIA: His LDL was 107 in the absence of coronary disease with an HDL of 56 I would not make any changes.  CHEST PAIN:   He had normal coronaries.    Current medicines are reviewed at length with the patient today.  The patient does not have concerns regarding medicines.  The following changes have been made: None  Labs/ tests ordered today include: None  Orders Placed This Encounter  Procedures   EKG 12-Lead     Disposition:   FU with me in 3 - 4 months.   Signed, Minus Breeding, MD  06/11/2022 9:24 AM    Dewy Rose

## 2022-06-11 ENCOUNTER — Encounter: Payer: Self-pay | Admitting: Cardiology

## 2022-06-11 ENCOUNTER — Ambulatory Visit: Payer: Medicare Other | Attending: Cardiology | Admitting: Cardiology

## 2022-06-11 VITALS — BP 128/76 | HR 53 | Ht 69.0 in | Wt 154.8 lb

## 2022-06-11 DIAGNOSIS — E785 Hyperlipidemia, unspecified: Secondary | ICD-10-CM | POA: Insufficient documentation

## 2022-06-11 DIAGNOSIS — R072 Precordial pain: Secondary | ICD-10-CM | POA: Diagnosis not present

## 2022-06-11 DIAGNOSIS — I350 Nonrheumatic aortic (valve) stenosis: Secondary | ICD-10-CM | POA: Insufficient documentation

## 2022-06-11 NOTE — Patient Instructions (Signed)
Medication Instructions:  No changes *If you need a refill on your cardiac medications before your next appointment, please call your pharmacy*   Lab Work: None ordered If you have labs (blood work) drawn today and your tests are completely normal, you will receive your results only by: [place] (if you have MyChart) OR A paper copy in the mail If you have any lab test that is abnormal or we need to change your treatment, we will call you to review the results.   Testing/Procedures: None ordered   Follow-Up: At [facility], you and your health needs are our priority.  As part of our continuing mission to provide you with exceptional heart care, we have created designated Provider Care Teams.  These Care Teams include your primary Cardiologist (physician) and Advanced Practice Providers (APPs -  Physician Assistants and Nurse Practitioners) who all work together to provide you with the care you need, when you need it.  We recommend signing up for the patient portal called "MyChart".  Sign up information is provided on this After Visit Summary.  MyChart is used to connect with patients for Virtual Visits (Telemedicine).  Patients are able to view lab/test results, encounter notes, upcoming appointments, etc.  Non-urgent messages can be sent to your provider as well.   To learn more about what you can do with MyChart, go to [email or web address].    Your next appointment:   4 month(s)  The format for your next appointment:   In Person  Provider:   Minus Breeding, MD      Important Information About Sugar

## 2022-06-16 ENCOUNTER — Telehealth: Payer: Self-pay | Admitting: Cardiology

## 2022-06-16 NOTE — Telephone Encounter (Signed)
Called patient back. He states he woke up yesterday morning and his right arm at the cath site was swelling and painful- he states it was painful for a little bit and then went away and has not came back. He states today it is okay, not a lot of swelling, no redness, not hot only painful if he pushes on it. Patient would like to have a scan of his arm to make sure there wasn't anything going on - patient requested I make Dr.Hochrein aware of this and see what he recommends. I will route to MD to advise.

## 2022-06-16 NOTE — Telephone Encounter (Signed)
Pt states that he had a Heart Cath put in on 05/30/22. Pt says that the injection site has since been swelling and painful. Pt would like a callback from nurse. Please advise

## 2022-06-18 ENCOUNTER — Ambulatory Visit (HOSPITAL_COMMUNITY)
Admission: RE | Admit: 2022-06-18 | Discharge: 2022-06-18 | Disposition: A | Discharge status: Home / Self Care | Payer: Medicare Other | Source: Ambulatory Visit | Attending: Physician Assistant | Admitting: Physician Assistant

## 2022-06-18 DIAGNOSIS — Z01818 Encounter for other preprocedural examination: Secondary | ICD-10-CM | POA: Diagnosis not present

## 2022-06-18 DIAGNOSIS — R911 Solitary pulmonary nodule: Secondary | ICD-10-CM | POA: Diagnosis not present

## 2022-06-18 DIAGNOSIS — I350 Nonrheumatic aortic (valve) stenosis: Secondary | ICD-10-CM | POA: Diagnosis not present

## 2022-06-18 DIAGNOSIS — N281 Cyst of kidney, acquired: Secondary | ICD-10-CM | POA: Diagnosis not present

## 2022-06-18 MED ORDER — IOHEXOL 350 MG/ML SOLN
100.0000 mL | Freq: Once | INTRAVENOUS | Status: AC | PRN
  Administered 2022-06-18: 100 mL via INTRAVENOUS

## 2022-06-18 NOTE — Telephone Encounter (Signed)
Called patient back. He states the site is slowly improving- still slightly swollen, but not bad. No drainage, no fever, no chills, no symptoms to report. I did offer to have it checked in clinic but patient is leaving for the beach for 2 weeks on Friday. He will continue to monitor and advised to call us if he needs [place].   Patient verbalized understanding, thankful for call back.

## 2022-06-23 ENCOUNTER — Other Ambulatory Visit: Payer: Self-pay | Admitting: Physician Assistant

## 2022-07-10 ENCOUNTER — Institutional Professional Consult (permissible substitution) (INDEPENDENT_AMBULATORY_CARE_PROVIDER_SITE_OTHER): Payer: Medicare Other | Admitting: Cardiothoracic Surgery

## 2022-07-10 ENCOUNTER — Encounter: Payer: Self-pay | Admitting: Cardiothoracic Surgery

## 2022-07-10 VITALS — BP 150/80 | HR 65 | Resp 20 | Ht 69.5 in | Wt 154.0 lb

## 2022-07-10 DIAGNOSIS — I350 Nonrheumatic aortic (valve) stenosis: Secondary | ICD-10-CM | POA: Diagnosis not present

## 2022-07-14 ENCOUNTER — Other Ambulatory Visit: Payer: Self-pay

## 2022-07-14 DIAGNOSIS — I350 Nonrheumatic aortic (valve) stenosis: Secondary | ICD-10-CM

## 2022-07-18 ENCOUNTER — Encounter (HOSPITAL_COMMUNITY)
Admission: RE | Admit: 2022-07-18 | Discharge: 2022-07-18 | Disposition: A | Discharge status: Home / Self Care | Payer: Medicare Other | Source: Ambulatory Visit | Attending: Internal Medicine | Admitting: Internal Medicine

## 2022-07-18 ENCOUNTER — Ambulatory Visit (HOSPITAL_COMMUNITY)
Admission: RE | Admit: 2022-07-18 | Discharge: 2022-07-18 | Disposition: A | Discharge status: Home / Self Care | Payer: Medicare Other | Source: Ambulatory Visit | Attending: Internal Medicine | Admitting: Internal Medicine

## 2022-07-18 DIAGNOSIS — I350 Nonrheumatic aortic (valve) stenosis: Secondary | ICD-10-CM | POA: Insufficient documentation

## 2022-07-18 DIAGNOSIS — Z20822 Contact with and (suspected) exposure to covid-19: Secondary | ICD-10-CM | POA: Insufficient documentation

## 2022-07-18 DIAGNOSIS — Z01818 Encounter for other preprocedural examination: Secondary | ICD-10-CM | POA: Diagnosis not present

## 2022-07-18 LAB — PROTIME-INR
INR: 1.1 (ref 0.8–1.2)
Prothrombin Time: 13.6 seconds (ref 11.4–15.2)

## 2022-07-18 LAB — URINALYSIS, ROUTINE W REFLEX MICROSCOPIC
Bilirubin Urine: NEGATIVE
Glucose, UA: NEGATIVE mg/dL
Hgb urine dipstick: NEGATIVE
Ketones, ur: NEGATIVE mg/dL
Leukocytes,Ua: NEGATIVE
Nitrite: NEGATIVE
Protein, ur: NEGATIVE mg/dL
Specific Gravity, Urine: 1.024 (ref 1.005–1.030)
pH: 5 (ref 5.0–8.0)

## 2022-07-18 LAB — CBC
HCT: 37.8 % — ABNORMAL LOW (ref 39.0–52.0)
Hemoglobin: 12.8 g/dL — ABNORMAL LOW (ref 13.0–17.0)
MCH: 30.3 pg (ref 26.0–34.0)
MCHC: 33.9 g/dL (ref 30.0–36.0)
MCV: 89.4 fL (ref 80.0–100.0)
Platelets: 146 10*3/uL — ABNORMAL LOW (ref 150–400)
RBC: 4.23 MIL/uL (ref 4.22–5.81)
RDW: 14.4 % (ref 11.5–15.5)
WBC: 4.8 10*3/uL (ref 4.0–10.5)
nRBC: 0 % (ref 0.0–0.2)

## 2022-07-18 LAB — SURGICAL PCR SCREEN
MRSA, PCR: NEGATIVE
Staphylococcus aureus: NEGATIVE

## 2022-07-18 LAB — COMPREHENSIVE METABOLIC PANEL
ALT: 32 U/L (ref 0–44)
AST: 41 U/L (ref 15–41)
Albumin: 4 g/dL (ref 3.5–5.0)
Alkaline Phosphatase: 47 U/L (ref 38–126)
Anion gap: 6 (ref 5–15)
BUN: 28 mg/dL — ABNORMAL HIGH (ref 8–23)
CO2: 29 mmol/L (ref 22–32)
Calcium: 9.1 mg/dL (ref 8.9–10.3)
Chloride: 107 mmol/L (ref 98–111)
Creatinine, Ser: 1.43 mg/dL — ABNORMAL HIGH (ref 0.61–1.24)
GFR, Estimated: 50 mL/min — ABNORMAL LOW (ref >60)
Glucose, Bld: 79 mg/dL (ref 70–99)
Potassium: 4.3 mmol/L (ref 3.5–5.1)
Sodium: 142 mmol/L (ref 135–145)
Total Bilirubin: 0.7 mg/dL (ref 0.3–1.2)
Total Protein: 6.6 g/dL (ref 6.5–8.1)

## 2022-07-18 LAB — TYPE AND SCREEN
ABO/RH(D): O NEG
Antibody Screen: NEGATIVE

## 2022-07-18 LAB — SARS CORONAVIRUS 2 (TAT 6-24 HRS): SARS Coronavirus 2: NEGATIVE

## 2022-07-18 NOTE — Progress Notes (Signed)
Pt seen in PAT for labs, EKG, CXR and Covid testing. Informed consent obtained for procedure. All concerns addressed during this visit.    Jacqlyn Larsen, RN

## 2022-07-20 NOTE — Progress Notes (Signed)
[address]       [place], [address]             [phone]        [name] Medical Record #[id number] Date of Birth: 1940/10/17  Referring: Minus Breeding, MD Primary Care: Inda Coke, [place] Primary Cardiologist:James Hochrein, MD  Chief Complaint:    Chief Complaint  Patient presents with   Aortic Stenosis    Surgical consult for TAVR, review all testing    History of Present Illness:     81 y.o. male here for for recommendations regarding his severe aortic stenosis.  He was recently seen by Dr. Percival Spanish and reported increasing fatigue, occasional chest discomfort, and dyspnea over the past year. Has not had any presyncope or syncope.   He does get short of breath and occasionally has chest pain when moving around heavy boxes.    Regarding the AS, he has Severe aortic stenosis with an aortic valve area of 0.75 cm2, mean gradient of 36 mmHg, and peak velocity of 4.2 m/s without ejection fraction of 60 to 65%; no conduction abnormalities.  He reports no history of aortic syndromes, hereditary aortopathy or sudden death in his family.    Past Medical History:  Diagnosis Date   Acne rosacea 01/02/2017   Arthralgia of left shoulder region 01/02/2017   Chronic insomnia 01/02/2017   Chronic midline low back pain without sciatica 01/02/2017   Gastroesophageal reflux disease without esophagitis 01/02/2017   Hyperlipidemia 11/28/2015   Macular degeneration 01/02/2017   Prostate cancer [facility])     Past Surgical History:  Procedure Laterality Date   ABDOMINAL AORTOGRAM N/A 06/06/2022   Procedure: ABDOMINAL AORTOGRAM;  Surgeon: Early Osmond, MD;  Location: [place] [facility];  Service: Cardiovascular;  Laterality: N/A;   HERNIA REPAIR     PROSTATE SURGERY  2012   RIGHT/LEFT HEART CATH AND CORONARY ANGIOGRAPHY N/A 06/06/2022   Procedure: RIGHT/LEFT HEART CATH AND CORONARY ANGIOGRAPHY;  Surgeon: Early Osmond, MD;  Location: [place] [facility];  Service: Cardiovascular;  Laterality: N/A;    Social History   Tobacco Use  Smoking Status Former   Types: Pipe   Quit date: 03/20/1981   Years since quitting: 41.3  Smokeless Tobacco Never  Tobacco Comments   Quit in 1983 pipe smoker     Social History   Substance and Sexual Activity  Alcohol Use No     No Known Allergies  Current Outpatient Medications  Medication Sig Dispense Refill   aspirin EC 81 MG tablet Take 81 mg by mouth daily. Swallow whole.     Cholecalciferol (D3-1000 PO) Take 1,000 Units by mouth every other day.     CINNAMON PO Take 1,200 mg by mouth daily.     clobetasol cream (TEMOVATE) 0.05 % APPLY TOPICALLY TO THE AFFECTED AREA TWICE DAILY (Patient taking differently: Apply 1 Application topically 2 (two) times daily as needed (irritation).) 45 g 1   cyanocobalamin (VITAMIN B12) 1000 MCG tablet Take 1,000 mcg by mouth daily.     Ginkgo Biloba 60 MG CAPS Take 60 mg by mouth daily.     Melatonin 10 MG TABS Take 10 mg by mouth at bedtime.     mirtazapine (REMERON) 30 MG tablet TAKE 1 TO 1 AND 1/2 TABLETS BY MOUTH AT NIGHT (Patient taking differently: Take 30-45 mg by mouth See admin instructions. Take 30 mg at bedtime, may increase to 45 mg as needed  for severe insomnia) 135 tablet 0   Multiple Vitamins-Minerals (PRESERVISION AREDS 2+MULTI VIT PO) Take 2 capsules by mouth daily.     OVER THE COUNTER MEDICATION Take 4 tablets by mouth daily. Acuitol (Advanced Mental Acuity Support)     pantoprazole (PROTONIX) 40 MG tablet TAKE 1 TABLET(40 MG) BY MOUTH DAILY 90 tablet 3   traZODone (DESYREL) 150 MG tablet Take 50-100 mg by mouth See admin instructions. Take 50 mg at bedtime, may increase to 100 mg as needed for severe insomnia     Turmeric (QC TUMERIC COMPLEX PO) Take 1,000 mg by mouth daily.     Polyethyl Glycol-Propyl Glycol (SYSTANE OP) Place 1 drop into both eyes daily as needed (dry eye).     No current facility-administered medications for  this visit.    (Not in a hospital admission)   Family History  Problem Relation Age of Onset   Heart disease Mother        CHF   Hypertension Mother    Prostate cancer Father    Heart disease Maternal Grandmother    Stroke Maternal Grandmother      Review of Systems:   ROS  14 pt ros negative except as per above    Physical Exam: BP (!) 150/80   Pulse 65   Resp 20   Ht 5' 9.5" (1.765 m)   Wt 154 lb (69.9 kg)   SpO2 98% Comment: RA  BMI 22.42 kg/m   NAD Alert Nonlaboured resp Abd soft nd Extr wwp  Diagnostic Studies & Laboratory data:     Recent Radiology Findings:   No results found.   I have independently reviewed the above radiologic studies and discussed with the patient   Recent Lab Findings: Lab Results  Component Value Date   WBC 4.8 07/18/2022   HGB 12.8 (L) 07/18/2022   HCT 37.8 (L) 07/18/2022   PLT 146 (L) 07/18/2022   GLUCOSE 79 07/18/2022   CHOL 181 07/24/2021   TRIG 89.0 07/24/2021   HDL 55.80 07/24/2021   LDLCALC 107 (H) 07/24/2021   ALT 32 07/18/2022   AST 41 07/18/2022   NA 142 07/18/2022   K 4.3 07/18/2022   CL 107 07/18/2022   CREATININE 1.43 (H) 07/18/2022   BUN 28 (H) 07/18/2022   CO2 29 07/18/2022   TSH 2.13 03/21/2020   INR 1.1 07/18/2022   CTA 9/23  ADDENDUM REPORT: 06/18/2022 12:45   CLINICAL DATA:  Aortic stenosis   EXAM: Cardiac TAVR CT   TECHNIQUE: The patient was scanned on a Siemens Force 657 slice scanner. A 120 kV retrospective scan was triggered in the descending thoracic aorta at 111 HU's. Gantry rotation speed was 270 msecs and collimation was .9 mm. No beta blockade or nitro were given. The 3D data set was reconstructed in 5% intervals of the R-R cycle. Systolic and diastolic phases were analyzed on a dedicated work station using MPR, MIP and VRT modes. The patient received 80 cc of contrast.   FINDINGS: Aortic Valve: Calcified bicuspid with fused right/left cusp and calcified raphe calcium  score 7477   Aorta: Minimal calcified atherosclerosis normal arch vessels no coarctation   Sinotubular Junction: 30 mm   Ascending Thoracic Aorta: 41 mm   Aortic Arch: 25 mm   Descending Thoracic Aorta: 23 mm   Non commisural sinus 43 mm   Commisural sinus 32 mm   Coronary Artery Height above Annulus:   Left Main: 23 mm above annulus   Right Coronary: 15.5  mm above annulus   Virtual Basal Annulus Measurements:   Maximum/Minimum Diameter: 28.7 mm x 28.5 mm   Perimeter: 95.6 mm   Area: 705 mm2   Coronary Arteries: Sufficient height above annulus for deployment   Optimum Fluoroscopic Angle for Delivery: LAO 14 Caudal 14 degrees   IMPRESSION: 1. 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 Annular area of 705 mm2 slightly over upper limit for 29 mm Sapien 3 valve but given bicuspid valve with calcified raphe suspect that would be good valve choice   3.  Coronary arteries sufficient height above annulus for deployment   4. Optimum angiographic angle for deployment LAO 14 Caudal 14 degrees   4.  Membranous septal length 6 mm   Given age, bicuspid valve and aneurysmal sinus/ascending thoracic aorta consideration for SAVR with root replacement would be appropriate as well   Jenkins Rouge     Electronically Signed   By: Jenkins Rouge M.D.   On: 06/18/2022 12:45  TAVR evaluation   EXAM: CT ANGIOGRAPHY CHEST, ABDOMEN AND PELVIS   TECHNIQUE: Multidetector CT imaging through the chest, abdomen and pelvis was performed using the standard protocol during bolus administration of intravenous contrast. Multiplanar reconstructed images and MIPs were obtained and reviewed to evaluate the vascular anatomy.   RADIATION DOSE REDUCTION: This exam was performed according to the departmental dose-optimization program which includes automated exposure control, adjustment of the mA and/or kV according to patient size and/or use of iterative  reconstruction technique.   CONTRAST:  155m OMNIPAQUE IOHEXOL 350 MG/ML SOLN   COMPARISON:  None Available.   FINDINGS: CTA CHEST FINDINGS   Cardiovascular: Heart size. No pericardial effusion. Severe aortic valve thickening and calcifications. Normal caliber thoracic aorta with mild atherosclerotic disease. No suspicious filling defects of the central pulmonary arteries. No visualized coronary artery calcifications.   Mediastinum/Nodes: Moderate hiatal hernia and patulous esophagus. Thyroid is unremarkable. Right-greater-than-left enlarged hilar lymph nodes reference right hilar lymph node measuring 1.7 cm short axis on series 4, image 71. Reference left hilar lymph node measuring 1.1 cm on image 77.   Lungs/Pleura: Central airways are patent. No consolidation, pleural effusion or pneumothorax. Numerous small solid pulmonary nodules, largest measures 5 mm and is located in the right lower lobe. Small fat containing left-sided Bochdalek hernia.   Musculoskeletal: No chest wall abnormality. No acute or significant osseous findings.   CTA ABDOMEN AND PELVIS FINDINGS   Hepatobiliary: Numerous scattered low-attenuation liver lesions which are compatible with simple cysts. Gallbladder is unremarkable. No biliary ductal dilation.   Pancreas: Unremarkable. No pancreatic ductal dilatation or surrounding inflammatory changes.   Spleen: Normal in size without focal abnormality.   Adrenals/Urinary Tract: Bilateral adrenal glands are unremarkable. No hydronephrosis or nephrolithiasis. Simple appearing right renal cysts, no specific follow-up imaging is recommended. Bladder is unremarkable.   Stomach/Bowel: Stomach is within normal limits. Mild diverticulosis. No evidence of bowel wall thickening, distention, or inflammatory changes.   Vascular/lymphatic: Normal caliber abdominal aorta with mild atherosclerotic disease. No significant stenosis. Pathologically enlarged lymph  nodes seen in the abdomen or pelvis   Reproductive: Prostate is not visualized may be atrophic surgically absent.   Other: No abdominal wall hernia or abnormality. No abdominopelvic ascites.   Musculoskeletal: No acute or significant osseous findings.   VASCULAR MEASUREMENTS PERTINENT TO TAVR:   AORTA:   Minimal Aortic Diameter-12.6 mm   Severity of Aortic Calcification-mild   RIGHT PELVIS:   Right Common Iliac Artery -  Minimal Diameter-7.5 mm   Tortuosity-mild   Calcification-mild   Right External Iliac Artery -   Minimal Diameter-7.7 mm   Tortuosity-mild   Calcification-none   Right Common Femoral Artery -   Minimal Diameter-7.8 mm   Tortuosity-mild   Calcification-mild   LEFT PELVIS:   Left Common Iliac Artery -   Minimal Diameter-9.2 mm   Tortuosity-mild   Calcification-mild   Left External Iliac Artery -   Minimal Diameter-7.7 mm   Tortuosity-mild   Calcification-none   Left Common Femoral Artery -   Minimal Diameter-8.8 mm   Tortuosity-none   Calcification-mild   Review of the MIP images confirms the above findings.   IMPRESSION: 1. Vascular findings and measurements pertinent to potential TAVR procedure, as detailed above. 2. Thickening and calcification of the aortic valve, compatible with reported clinical history of aortic stenosis. 3. Mild aortoiliac atherosclerosis. 4. Enlarged right-greater-than-left hilar lymph nodes, possibly reactive. Recommend follow-up chest CT in 3 months to ensure stability or resolution. 5. Multiple small solid pulmonary nodules, largest measures 5 mm. No follow-up needed if patient is low-risk (and has no known or suspected primary neoplasm). Non-contrast chest CT can be considered in 12 months if patient is high-risk. This recommendation follows the consensus statement: Guidelines for Management of Incidental Pulmonary Nodules Detected on CT Images: From the Fleischner Society 2017;  Radiology 2017; 284:228-243.     Assessment / Plan:   53 M presents for symptomatic severe aortic stenosis.  R/L fusion bicuspid associated with a 4.2cm Aorta Meets criteria for AV replacement. 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 STS risk is 1%.  He is up to date with dental care.  We discussed him in multidisciplinary valve clinic and found his anatomy to be feasible for TAVR.   The patient is interested in TAVR at this time. He understands that if his aortic diameter increases, he may meet criteria for intervention on that in the future. Though at 20, the window for needing that done is limited.   He would be a candidate for surgical bailout if needed. Valve: likely 29 mm Edwards S3. Our read on the area is 705 mm2. Industry is 659m2. The recommended range for a 29S3 is 540-6862m.   We may need to add extra to the inflation.  Approach: Transfemoral NYHA class: II   DaJustice RocherD CV Surgery

## 2022-07-21 MED ORDER — DEXMEDETOMIDINE HCL IN NACL 400 MCG/100ML IV SOLN
0.1000 ug/kg/h | INTRAVENOUS | Status: AC
  Administered 2022-07-22: 69.92 ug via INTRAVENOUS
  Filled 2022-07-21: qty 100

## 2022-07-21 MED ORDER — POTASSIUM CHLORIDE 2 MEQ/ML IV SOLN
80.0000 meq | INTRAVENOUS | Status: DC
  Filled 2022-07-21: qty 40

## 2022-07-21 MED ORDER — HEPARIN 30,000 UNITS/1000 ML (OHS) CELLSAVER SOLUTION
Status: DC
  Filled 2022-07-21: qty 1000

## 2022-07-21 MED ORDER — NOREPINEPHRINE 4 MG/250ML-% IV SOLN
0.0000 ug/min | INTRAVENOUS | Status: DC
  Filled 2022-07-21: qty 250

## 2022-07-21 MED ORDER — CEFAZOLIN SODIUM-DEXTROSE 2-4 GM/100ML-% IV SOLN
2.0000 g | INTRAVENOUS | Status: AC
  Administered 2022-07-22: 2 g via INTRAVENOUS
  Filled 2022-07-21: qty 100

## 2022-07-21 MED ORDER — MAGNESIUM SULFATE 50 % IJ SOLN
40.0000 meq | INTRAMUSCULAR | Status: DC
  Filled 2022-07-21: qty 9.85

## 2022-07-22 ENCOUNTER — Inpatient Hospital Stay (HOSPITAL_COMMUNITY)
Admission: RE | Admit: 2022-07-22 | Discharge: 2022-07-23 | DRG: 266 | Disposition: A | Discharge status: Home / Self Care | Payer: Medicare Other | Attending: Cardiothoracic Surgery | Admitting: Cardiothoracic Surgery

## 2022-07-22 ENCOUNTER — Inpatient Hospital Stay (HOSPITAL_COMMUNITY): Payer: Medicare Other

## 2022-07-22 ENCOUNTER — Encounter (HOSPITAL_COMMUNITY): Payer: Self-pay | Admitting: Internal Medicine

## 2022-07-22 ENCOUNTER — Other Ambulatory Visit (HOSPITAL_COMMUNITY): Payer: Self-pay | Admitting: *Deleted

## 2022-07-22 ENCOUNTER — Other Ambulatory Visit: Payer: Self-pay | Admitting: Physician Assistant

## 2022-07-22 ENCOUNTER — Other Ambulatory Visit: Payer: Self-pay

## 2022-07-22 ENCOUNTER — Encounter (HOSPITAL_COMMUNITY)
Admission: RE | Disposition: A | Discharge status: Home / Self Care | Payer: Self-pay | Source: Home / Self Care | Attending: Cardiothoracic Surgery

## 2022-07-22 DIAGNOSIS — I440 Atrioventricular block, first degree: Secondary | ICD-10-CM | POA: Diagnosis not present

## 2022-07-22 DIAGNOSIS — R590 Localized enlarged lymph nodes: Secondary | ICD-10-CM | POA: Diagnosis present

## 2022-07-22 DIAGNOSIS — H3530 Unspecified macular degeneration: Secondary | ICD-10-CM | POA: Diagnosis present

## 2022-07-22 DIAGNOSIS — Z952 Presence of prosthetic heart valve: Secondary | ICD-10-CM

## 2022-07-22 DIAGNOSIS — Z823 Family history of stroke: Secondary | ICD-10-CM | POA: Diagnosis not present

## 2022-07-22 DIAGNOSIS — Z87891 Personal history of nicotine dependence: Secondary | ICD-10-CM

## 2022-07-22 DIAGNOSIS — Z006 Encounter for examination for normal comparison and control in clinical research program: Secondary | ICD-10-CM

## 2022-07-22 DIAGNOSIS — R918 Other nonspecific abnormal finding of lung field: Secondary | ICD-10-CM | POA: Diagnosis present

## 2022-07-22 DIAGNOSIS — G8929 Other chronic pain: Secondary | ICD-10-CM | POA: Diagnosis present

## 2022-07-22 DIAGNOSIS — Z8546 Personal history of malignant neoplasm of prostate: Secondary | ICD-10-CM

## 2022-07-22 DIAGNOSIS — I350 Nonrheumatic aortic (valve) stenosis: Secondary | ICD-10-CM | POA: Diagnosis not present

## 2022-07-22 DIAGNOSIS — I708 Atherosclerosis of other arteries: Secondary | ICD-10-CM | POA: Diagnosis present

## 2022-07-22 DIAGNOSIS — Z8042 Family history of malignant neoplasm of prostate: Secondary | ICD-10-CM

## 2022-07-22 DIAGNOSIS — Z79890 Hormone replacement therapy (postmenopausal): Secondary | ICD-10-CM | POA: Diagnosis not present

## 2022-07-22 DIAGNOSIS — M545 Low back pain, unspecified: Secondary | ICD-10-CM | POA: Diagnosis present

## 2022-07-22 DIAGNOSIS — F5104 Psychophysiologic insomnia: Secondary | ICD-10-CM | POA: Diagnosis present

## 2022-07-22 DIAGNOSIS — Z7982 Long term (current) use of aspirin: Secondary | ICD-10-CM

## 2022-07-22 DIAGNOSIS — Z8249 Family history of ischemic heart disease and other diseases of the circulatory system: Secondary | ICD-10-CM

## 2022-07-22 DIAGNOSIS — M1990 Unspecified osteoarthritis, unspecified site: Secondary | ICD-10-CM

## 2022-07-22 DIAGNOSIS — Z79899 Other long term (current) drug therapy: Secondary | ICD-10-CM | POA: Diagnosis not present

## 2022-07-22 DIAGNOSIS — K219 Gastro-esophageal reflux disease without esophagitis: Secondary | ICD-10-CM | POA: Diagnosis present

## 2022-07-22 DIAGNOSIS — I5033 Acute on chronic diastolic (congestive) heart failure: Secondary | ICD-10-CM | POA: Diagnosis present

## 2022-07-22 DIAGNOSIS — I700 Atherosclerosis of aorta: Secondary | ICD-10-CM | POA: Diagnosis present

## 2022-07-22 DIAGNOSIS — E785 Hyperlipidemia, unspecified: Secondary | ICD-10-CM | POA: Diagnosis present

## 2022-07-22 DIAGNOSIS — C61 Malignant neoplasm of prostate: Secondary | ICD-10-CM

## 2022-07-22 DIAGNOSIS — Q231 Congenital insufficiency of aortic valve: Secondary | ICD-10-CM | POA: Diagnosis not present

## 2022-07-22 HISTORY — PX: TRANSCATHETER AORTIC VALVE REPLACEMENT, TRANSFEMORAL: SHX6400

## 2022-07-22 HISTORY — PX: ULTRASOUND GUIDANCE FOR VASCULAR ACCESS: SHX6516

## 2022-07-22 HISTORY — PX: INTRAOPERATIVE TRANSTHORACIC ECHOCARDIOGRAM: SHX6523

## 2022-07-22 HISTORY — PX: BALLOON AORTIC VALVE VALVULOPLASTY: SHX6412

## 2022-07-22 HISTORY — DX: Presence of prosthetic heart valve: Z95.2

## 2022-07-22 LAB — ECHOCARDIOGRAM LIMITED
AR max vel: 4.08 cm2
AV Area VTI: 3.75 cm2
AV Area mean vel: 4.2 cm2
AV Mean grad: 2.7 mmHg
AV Peak grad: 4.9 mmHg
Ao pk vel: 1.11 m/s
Calc EF: 62.7 %
Single Plane A2C EF: 61.9 %
Single Plane A4C EF: 59 %

## 2022-07-22 LAB — POCT I-STAT, CHEM 8
BUN: 21 mg/dL (ref 8–23)
BUN: 22 mg/dL (ref 8–23)
Calcium, Ion: 1.19 mmol/L (ref 1.15–1.40)
Calcium, Ion: 1.23 mmol/L (ref 1.15–1.40)
Chloride: 107 mmol/L (ref 98–111)
Chloride: 105 mmol/L (ref 98–111)
Creatinine, Ser: 1 mg/dL (ref 0.61–1.24)
Creatinine, Ser: 1.1 mg/dL (ref 0.61–1.24)
Glucose, Bld: 96 mg/dL (ref 70–99)
Glucose, Bld: 123 mg/dL — ABNORMAL HIGH (ref 70–99)
HCT: 36 % — ABNORMAL LOW (ref 39.0–52.0)
HCT: 33 % — ABNORMAL LOW (ref 39.0–52.0)
Hemoglobin: 12.2 g/dL — ABNORMAL LOW (ref 13.0–17.0)
Hemoglobin: 11.2 g/dL — ABNORMAL LOW (ref 13.0–17.0)
Potassium: 4 mmol/L (ref 3.5–5.1)
Potassium: 4.2 mmol/L (ref 3.5–5.1)
Sodium: 142 mmol/L (ref 135–145)
Sodium: 140 mmol/L (ref 135–145)
TCO2: 25 mmol/L (ref 22–32)
TCO2: 27 mmol/L (ref 22–32)

## 2022-07-22 LAB — POCT I-STAT 7, (LYTES, BLD GAS, ICA,H+H)
Acid-base deficit: 1 mmol/L (ref 0.0–2.0)
Bicarbonate: 24.6 mmol/L (ref 20.0–28.0)
Calcium, Ion: 1.22 mmol/L (ref 1.15–1.40)
HCT: 36 % — ABNORMAL LOW (ref 39.0–52.0)
Hemoglobin: 12.2 g/dL — ABNORMAL LOW (ref 13.0–17.0)
O2 Saturation: 100 %
Potassium: 4.1 mmol/L (ref 3.5–5.1)
Sodium: 141 mmol/L (ref 135–145)
TCO2: 26 mmol/L (ref 22–32)
pCO2 arterial: 41.6 mmHg (ref 32–48)
pH, Arterial: 7.379 (ref 7.35–7.45)
pO2, Arterial: 253 mmHg — ABNORMAL HIGH (ref 83–108)

## 2022-07-22 SURGERY — IMPLANTATION, AORTIC VALVE, TRANSCATHETER, FEMORAL APPROACH
Anesthesia: Monitor Anesthesia Care | Site: Chest

## 2022-07-22 MED ORDER — ONDANSETRON HCL 4 MG/2ML IJ SOLN
4.0000 mg | Freq: Four times a day (QID) | INTRAMUSCULAR | Status: DC | PRN
  Administered 2022-07-22: 4 mg via INTRAVENOUS

## 2022-07-22 MED ORDER — ALUM & MAG HYDROXIDE-SIMETH 200-200-20 MG/5ML PO SUSP
30.0000 mL | ORAL | Status: DC | PRN
  Administered 2022-07-22: 30 mL via ORAL

## 2022-07-22 MED ORDER — ACETAMINOPHEN 650 MG RE SUPP: 650.0000 mg | Freq: Four times a day (QID) | RECTAL | Status: DC | PRN

## 2022-07-22 MED ORDER — MORPHINE SULFATE (PF) 2 MG/ML IV SOLN: 1.0000 mg | INTRAVENOUS | Status: DC | PRN

## 2022-07-22 MED ORDER — CHLORHEXIDINE GLUCONATE 4 % EX LIQD: 60.0000 mL | Freq: Once | CUTANEOUS | Status: DC

## 2022-07-22 MED ORDER — EPHEDRINE SULFATE (PRESSORS) 50 MG/ML IJ SOLN
INTRAMUSCULAR | Status: DC | PRN
  Administered 2022-07-22: 2.5 mg via INTRAVENOUS

## 2022-07-22 MED ORDER — IODIXANOL 320 MG/ML IV SOLN
INTRAVENOUS | Status: DC | PRN
  Administered 2022-07-22: 96.1 mL

## 2022-07-22 MED ORDER — SODIUM CHLORIDE 0.9 % IV SOLN: INTRAVENOUS | Status: DC

## 2022-07-22 MED ORDER — HEPARIN SODIUM (PORCINE) 1000 UNIT/ML IJ SOLN
INTRAMUSCULAR | Status: DC | PRN
  Administered 2022-07-22: 11000 [IU] via INTRAVENOUS

## 2022-07-22 MED ORDER — CHLORHEXIDINE GLUCONATE 0.12 % MT SOLN
OROMUCOSAL | Status: AC
  Administered 2022-07-22: 15 mL via OROMUCOSAL
  Filled 2022-07-22: qty 15

## 2022-07-22 MED ORDER — LACTATED RINGERS IV SOLN: INTRAVENOUS | Status: DC | PRN

## 2022-07-22 MED ORDER — SODIUM CHLORIDE 0.9% FLUSH
3.0000 mL | Freq: Two times a day (BID) | INTRAVENOUS | Status: DC
  Administered 2022-07-22 – 2022-07-23 (×2): 3 mL via INTRAVENOUS

## 2022-07-22 MED ORDER — TRAMADOL HCL 50 MG PO TABS: 50.0000 mg | ORAL_TABLET | ORAL | Status: DC | PRN

## 2022-07-22 MED ORDER — CEFAZOLIN SODIUM-DEXTROSE 2-4 GM/100ML-% IV SOLN
2.0000 g | Freq: Three times a day (TID) | INTRAVENOUS | Status: AC
  Administered 2022-07-22 – 2022-07-23 (×2): 2 g via INTRAVENOUS
  Filled 2022-07-22 (×2): qty 100

## 2022-07-22 MED ORDER — CHLORHEXIDINE GLUCONATE 0.12 % MT SOLN: 15.0000 mL | Freq: Once | OROMUCOSAL | Status: AC

## 2022-07-22 MED ORDER — ALUM & MAG HYDROXIDE-SIMETH 200-200-20 MG/5ML PO SUSP
ORAL | Status: AC
  Filled 2022-07-22: qty 30

## 2022-07-22 MED ORDER — SODIUM CHLORIDE 0.9 % IV SOLN: 250.0000 mL | INTRAVENOUS | Status: DC | PRN

## 2022-07-22 MED ORDER — ONDANSETRON HCL 4 MG/2ML IJ SOLN
INTRAMUSCULAR | Status: AC
  Filled 2022-07-22: qty 2

## 2022-07-22 MED ORDER — FUROSEMIDE 10 MG/ML IJ SOLN
40.0000 mg | Freq: Once | INTRAMUSCULAR | Status: AC
  Administered 2022-07-22: 40 mg via INTRAVENOUS

## 2022-07-22 MED ORDER — ONDANSETRON HCL 4 MG/2ML IJ SOLN
INTRAMUSCULAR | Status: DC | PRN
  Administered 2022-07-22: 4 mg via INTRAVENOUS

## 2022-07-22 MED ORDER — OXYCODONE HCL 5 MG PO TABS: 5.0000 mg | ORAL_TABLET | ORAL | Status: DC | PRN

## 2022-07-22 MED ORDER — TRAZODONE HCL 50 MG PO TABS
50.0000 mg | ORAL_TABLET | Freq: Every day | ORAL | Status: DC
  Administered 2022-07-22: 50 mg via ORAL
  Filled 2022-07-22: qty 2
  Filled 2022-07-22: qty 1

## 2022-07-22 MED ORDER — NITROGLYCERIN IN D5W 200-5 MCG/ML-% IV SOLN
0.0000 ug/min | INTRAVENOUS | Status: DC
  Filled 2022-07-22: qty 250

## 2022-07-22 MED ORDER — ASPIRIN 81 MG PO TBEC
81.0000 mg | DELAYED_RELEASE_TABLET | Freq: Every day | ORAL | Status: DC
  Administered 2022-07-22 – 2022-07-23 (×2): 81 mg via ORAL
  Filled 2022-07-22 (×3): qty 1

## 2022-07-22 MED ORDER — POTASSIUM CHLORIDE CRYS ER 20 MEQ PO TBCR
20.0000 meq | EXTENDED_RELEASE_TABLET | Freq: Once | ORAL | Status: AC
  Administered 2022-07-22: 20 meq via ORAL

## 2022-07-22 MED ORDER — SODIUM CHLORIDE 0.9% FLUSH: 3.0000 mL | INTRAVENOUS | Status: DC | PRN

## 2022-07-22 MED ORDER — PANTOPRAZOLE SODIUM 40 MG PO TBEC
40.0000 mg | DELAYED_RELEASE_TABLET | Freq: Every day | ORAL | Status: DC
  Administered 2022-07-22 – 2022-07-23 (×2): 40 mg via ORAL
  Filled 2022-07-22 (×2): qty 1

## 2022-07-22 MED ORDER — LIDOCAINE HCL (PF) 1 % IJ SOLN
INTRAMUSCULAR | Status: AC
  Filled 2022-07-22: qty 30

## 2022-07-22 MED ORDER — CHLORHEXIDINE GLUCONATE 4 % EX LIQD: 30.0000 mL | CUTANEOUS | Status: DC

## 2022-07-22 MED ORDER — PROTAMINE SULFATE 10 MG/ML IV SOLN
INTRAVENOUS | Status: DC | PRN
  Administered 2022-07-22: 110 mg via INTRAVENOUS

## 2022-07-22 MED ORDER — HEPARIN 6000 UNIT IRRIGATION SOLUTION
Status: DC | PRN
  Administered 2022-07-22 (×3): 1

## 2022-07-22 MED ORDER — FUROSEMIDE 10 MG/ML IJ SOLN
INTRAMUSCULAR | Status: AC
  Filled 2022-07-22: qty 4

## 2022-07-22 MED ORDER — LIDOCAINE HCL 1 % IJ SOLN
INTRAMUSCULAR | Status: DC | PRN
  Administered 2022-07-22: 11 mL

## 2022-07-22 MED ORDER — MIRTAZAPINE 15 MG PO TABS
30.0000 mg | ORAL_TABLET | Freq: Every day | ORAL | Status: DC
  Administered 2022-07-22: 30 mg via ORAL
  Filled 2022-07-22: qty 2

## 2022-07-22 MED ORDER — POTASSIUM CHLORIDE CRYS ER 20 MEQ PO TBCR
EXTENDED_RELEASE_TABLET | ORAL | Status: AC
  Filled 2022-07-22: qty 1

## 2022-07-22 MED ORDER — HEPARIN 6000 UNIT IRRIGATION SOLUTION
Status: AC
  Filled 2022-07-22: qty 1500

## 2022-07-22 MED ORDER — SODIUM CHLORIDE 0.9 % IV SOLN: INTRAVENOUS | Status: AC

## 2022-07-22 MED ORDER — ACETAMINOPHEN 325 MG PO TABS: 650.0000 mg | ORAL_TABLET | Freq: Four times a day (QID) | ORAL | Status: DC | PRN

## 2022-07-22 MED ORDER — MELATONIN 5 MG PO TABS
10.0000 mg | ORAL_TABLET | Freq: Every day | ORAL | Status: DC
  Administered 2022-07-22: 10 mg via ORAL
  Filled 2022-07-22: qty 2

## 2022-07-22 SURGICAL SUPPLY — 73 items
ADH SKN CLS APL DERMABOND .7 (GAUZE/BANDAGES/DRESSINGS) ×6
APL PRP STRL LF DISP 70% ISPRP (MISCELLANEOUS) ×3
BAG COUNTER SPONGE SURGICOUNT (BAG) ×3 IMPLANT
BAG DECANTER FOR FLEXI CONT (MISCELLANEOUS) IMPLANT
BAG SNAP BAND KOVER 36X36 (MISCELLANEOUS) IMPLANT
BAG SPNG CNTER NS LX DISP (BAG) ×3
BALLN TRUE 20X4.5 (BALLOONS) ×3
BALLOON TRUE 20X4.5 (BALLOONS) IMPLANT
BLADE CLIPPER SURG (BLADE) IMPLANT
BLADE STERNUM SYSTEM 6 (BLADE) IMPLANT
CABLE ADAPT CONN TEMP 6FT (ADAPTER) ×3 IMPLANT
CABLE SURGICAL S-101-97-12 (CABLE) IMPLANT
CANISTER SUCT 3000ML PPV (MISCELLANEOUS) IMPLANT
CATH DIAG EXPO 6F AL1 (CATHETERS) IMPLANT
CATH DIAG EXPO 6F VENT PIG 145 (CATHETERS) ×6 IMPLANT
CATH INFINITI 6F AL2 (CATHETERS) IMPLANT
CATH S G BIP PACING (CATHETERS) ×3 IMPLANT
CHLORAPREP W/TINT 26 (MISCELLANEOUS) ×3 IMPLANT
CLOSURE MYNX CONTROL 6F/7F (Vascular Products) IMPLANT
CNTNR URN SCR LID CUP LEK RST (MISCELLANEOUS) ×6 IMPLANT
CONT SPEC 4OZ STRL OR WHT (MISCELLANEOUS) ×6
COVER BACK TABLE 80X110 HD (DRAPES) IMPLANT
DERMABOND ADVANCED .7 DNX12 (GAUZE/BANDAGES/DRESSINGS) ×3 IMPLANT
DEVICE CLOSURE PERCLS PRGLD 6F (VASCULAR PRODUCTS) ×6 IMPLANT
DRSG TEGADERM 4X4.75 (GAUZE/BANDAGES/DRESSINGS) ×6 IMPLANT
ELECT REM PT RETURN 9FT ADLT (ELECTROSURGICAL) ×3
ELECTRODE REM PT RTRN 9FT ADLT (ELECTROSURGICAL) ×3 IMPLANT
GAUZE 4X4 16PLY ~~LOC~~+RFID DBL (SPONGE) IMPLANT
GAUZE SPONGE 4X4 12PLY STRL (GAUZE/BANDAGES/DRESSINGS) ×3 IMPLANT
GLOVE BIO SURGEON STRL SZ7.5 (GLOVE) IMPLANT
GLOVE ECLIPSE 7.0 STRL STRAW (GLOVE) ×3 IMPLANT
GOWN STRL REUS W/ TWL LRG LVL3 (GOWN DISPOSABLE) ×3 IMPLANT
GOWN STRL REUS W/ TWL XL LVL3 (GOWN DISPOSABLE) IMPLANT
GOWN STRL REUS W/TWL LRG LVL3 (GOWN DISPOSABLE) ×3
GOWN STRL REUS W/TWL XL LVL3 (GOWN DISPOSABLE) ×9
GUIDEWIRE SAF TJ AMPL .035X180 (WIRE) ×3 IMPLANT
GUIDEWIRE SAFE TJ AMPLATZ EXST (WIRE) ×3 IMPLANT
KIT BASIN OR (CUSTOM PROCEDURE TRAY) ×3 IMPLANT
KIT HEART LEFT (KITS) ×3 IMPLANT
KIT SAPIAN 3 ULTRA RESILIA 29 (Valve) IMPLANT
KIT TURNOVER KIT B (KITS) ×3 IMPLANT
NS IRRIG 1000ML POUR BTL (IV SOLUTION) ×3 IMPLANT
PACK ENDO MINOR (CUSTOM PROCEDURE TRAY) ×3 IMPLANT
PAD ARMBOARD 7.5X6 YLW CONV (MISCELLANEOUS) ×6 IMPLANT
PAD ELECT DEFIB RADIOL ZOLL (MISCELLANEOUS) ×3 IMPLANT
PERCLOSE PROGLIDE 6F (VASCULAR PRODUCTS) ×6
POSITIONER HEAD DONUT 9IN (MISCELLANEOUS) ×3 IMPLANT
SET MICROPUNCTURE 5F STIFF (MISCELLANEOUS) ×3 IMPLANT
SHEATH BRITE TIP 7FR 35CM (SHEATH) ×3 IMPLANT
SHEATH PINNACLE 6F 10CM (SHEATH) ×3 IMPLANT
SHEATH PINNACLE 8F 10CM (SHEATH) ×3 IMPLANT
SLEEVE REPOSITIONING LENGTH 30 (MISCELLANEOUS) ×3 IMPLANT
SPIKE FLUID TRANSFER (MISCELLANEOUS) ×3 IMPLANT
SPONGE T-LAP 18X18 ~~LOC~~+RFID (SPONGE) ×3 IMPLANT
STOPCOCK MORSE 400PSI 3WAY (MISCELLANEOUS) ×6 IMPLANT
SUT PROLENE 6 0 C 1 30 (SUTURE) IMPLANT
SUT SILK  1 MH (SUTURE)
SUT SILK 1 MH (SUTURE) ×3 IMPLANT
SYR 3ML LL SCALE MARK (SYRINGE) IMPLANT
SYR 50ML LL SCALE MARK (SYRINGE) ×3 IMPLANT
SYR BULB IRRIG 60ML STRL (SYRINGE) IMPLANT
SYR MEDRAD MARK V 150ML (SYRINGE) IMPLANT
TOWEL GREEN STERILE (TOWEL DISPOSABLE) ×6 IMPLANT
TRANSDUCER W/STOPCOCK (MISCELLANEOUS) ×6 IMPLANT
TRAY FOLEY SLVR 14FR TEMP STAT (SET/KITS/TRAYS/PACK) IMPLANT
TUBE SUCT INTRACARD DLP 20F (MISCELLANEOUS) IMPLANT
TUBING ART PRESS 72  MALE/FEM (TUBING) ×3
TUBING ART PRESS 72 MALE/FEM (TUBING) IMPLANT
TUBING CIL FLEX 10 FLL-RA (TUBING) IMPLANT
WIRE EMERALD 3MM-J .035X150CM (WIRE) ×3 IMPLANT
WIRE EMERALD 3MM-J .035X260CM (WIRE) ×3 IMPLANT
WIRE EMERALD ST .035X260CM (WIRE) ×6 IMPLANT
WIRE MICRO SET SILHO 5FR 7 (SHEATH) IMPLANT

## 2022-07-22 NOTE — Discharge Instructions (Signed)

## 2022-07-22 NOTE — Anesthesia Procedure Notes (Signed)
Arterial Line Insertion Start/End10/31/2023 9:30 AM, 07/22/2022 9:32 AM Performed by: Darral Dash, DO, Griffin Dakin, CRNA, CRNA  Patient location: Pre-op. Preanesthetic checklist: patient identified, IV checked, site marked, risks and benefits discussed, surgical consent, monitors and equipment checked, pre-op evaluation, timeout performed and anesthesia consent Lidocaine 1% used for infiltration Right, radial was placed Catheter size: 20 G Hand hygiene performed  and maximum sterile barriers used  Allen's test indicative of satisfactory collateral circulation Attempts: 1 Procedure performed without using ultrasound guided technique. Following insertion, dressing applied. Post procedure assessment: normal and unchanged  Patient tolerated the procedure well with no immediate complications.

## 2022-07-22 NOTE — Transfer of Care (Signed)
Immediate Anesthesia Transfer of Care Note  Patient: Harold Leach  Procedure(s) Performed: Transcatheter Aortic Valve Replacement, Transfemoral (Chest) INTRAOPERATIVE TRANSTHORACIC ECHOCARDIOGRAM ULTRASOUND GUIDANCE FOR VASCULAR ACCESS BALLOON AORTIC VALVE VALVULOPLASTY  Patient Location: Cath Lab  Anesthesia Type:MAC  Level of Consciousness: awake, alert  and oriented  Airway & Oxygen Therapy: Patient Spontanous Breathing and Patient connected to face mask oxygen  Post-op Assessment: Report given to RN, Post -op Vital signs reviewed and stable and Patient moving all extremities  Post vital signs: Reviewed and stable  Last Vitals:  Vitals Value Taken Time  BP 160/54 07/22/22 1401  Temp 36.2 C 07/22/22 1359  Pulse 43 07/22/22 1403  Resp 18 07/22/22 1403  SpO2 93 % 07/22/22 1403  Vitals shown include unvalidated device data.  Last Pain:  Vitals:   07/22/22 1359  TempSrc: Temporal  PainSc: 0-No pain      Patients Stated Pain Goal: 0 (11/00/34 9611)  Complications: No notable events documented.

## 2022-07-22 NOTE — Progress Notes (Signed)
Dr. Ali Lowe paged and made aware of low BP. Bilateral groins level 0. Denies discomfort. Warm and dry, pink. Alert and oriented X 4. Ate PB crackers w/coke. No orders. Patient spoke w/wife on phone.

## 2022-07-22 NOTE — Anesthesia Preprocedure Evaluation (Signed)
Anesthesia Evaluation  Patient identified by MRN, date of birth, ID band Patient awake    Reviewed: Allergy & Precautions, NPO status , Patient's Chart, lab work & pertinent test results  Airway Mallampati: II  TM Distance: >3 FB Neck ROM: Full    Dental no notable dental hx.    Pulmonary neg pulmonary ROS, former smoker,    Pulmonary exam normal        Cardiovascular + Valvular Problems/Murmurs AS  Rhythm:Regular Rate:Normal + Systolic murmurs ECHO 40/08: 1. Heavily calcified aortic valve with severe AS (peak velocity 4.2 m/s;  mean gradient 36 mmHg; AVA 0.75 cm2; DI 0.17).  2. Left ventricular ejection fraction, by estimation, is 60 to 65%. The  left ventricle has normal function. The left ventricle has no regional  wall motion abnormalities. Left ventricular diastolic parameters are  consistent with Grade I diastolic  dysfunction (impaired relaxation).  3. Right ventricular systolic function is normal. The right ventricular  size is normal. There is normal pulmonary artery systolic pressure.  4. Left atrial size was mildly dilated.  5. Right atrial size was mildly dilated.  6. The mitral valve is normal in structure. Mild to moderate mitral valve  regurgitation. No evidence of mitral stenosis.  7. The aortic valve is calcified. Aortic valve regurgitation is not  visualized. Severe aortic valve stenosis.  8. Aortic dilatation noted. There is mild dilatation of the ascending  aorta, measuring 41 mm.  9. The inferior vena cava is normal in size with greater than 50%  respiratory variability, suggesting right atrial pressure of 3 mmHg.   Comparison(s): No prior Echocardiogram.    Neuro/Psych negative neurological ROS  negative psych ROS   GI/Hepatic Neg liver ROS, GERD  Medicated,  Endo/Other    Renal/GU negative Renal ROS  negative genitourinary   Musculoskeletal  (+) Arthritis , Osteoarthritis,     Abdominal Normal abdominal exam  (+)   Peds  Hematology   Anesthesia Other Findings   Reproductive/Obstetrics                             Anesthesia Physical Anesthesia Plan  ASA: 4  Anesthesia Plan: MAC   Post-op Pain Management:    Induction: Intravenous  PONV Risk Score and Plan: Ondansetron, Dexamethasone and Treatment may vary due to age or medical condition  Airway Management Planned: Simple Face Mask, Natural Airway and Nasal Cannula  Additional Equipment: Arterial line  Intra-op Plan:   Post-operative Plan:   Informed Consent: I have reviewed the patients History and Physical, chart, labs and discussed the procedure including the risks, benefits and alternatives for the proposed anesthesia with the patient or authorized representative who has indicated his/her understanding and acceptance.     Dental advisory given  Plan Discussed with: CRNA  Anesthesia Plan Comments: (Lab Results      Component                Value               Date                      WBC                      4.8                 07/18/2022  HGB                      12.8 (L)            07/18/2022                HCT                      37.8 (L)            07/18/2022                MCV                      89.4                07/18/2022                PLT                      146 (L)             07/18/2022           Lab Results      Component                Value               Date                      NA                       142                 07/18/2022                K                        4.3                 07/18/2022                CO2                      29                  07/18/2022                GLUCOSE                  79                  07/18/2022                BUN                      28 (H)              07/18/2022                CREATININE               1.43 (H)            07/18/2022                CALCIUM                  9.1  07/18/2022                EGFR                     55 (L)              05/14/2022                GFRNONAA                 50 (L)              07/18/2022          )        Anesthesia Quick Evaluation

## 2022-07-22 NOTE — Progress Notes (Signed)
Bedrest completed. Bilateral groin sites level 0. Ambulated to BR; tolerated well. Walked in holding area a few minutes and belched; states that helped his indigestion.

## 2022-07-22 NOTE — Op Note (Signed)
[facility]  [facility]R OPERATIVE NOTE   Date of Procedure:  07/22/2022  Preoperative Diagnosis: Severe Aortic Stenosis   Postoperative Diagnosis: Same   Procedure:   Transcatheter Aortic Valve Replacement - Transfemoral Approach  Edwards Sapien 3 Resilia THV (size 29 mm, model # 9755RLS, serial # [id number])   Co-Surgeons:   Justice Rocher, MD and Lenna Sciara, MD Anesthesiologist:  Rudean Haskell, MD  Echocardiographer:  Rudean Haskell, MD  Pre-operative Echo Findings: Severe aortic stenosis Normal left ventricular systolic function  Post-operative Echo Findings: Trace paravalvular leak Normal left ventricular systolic function  Left Heart Catheterization Findings: Left ventricular end-diastolic pressure of 24 mmHg   BRIEF CLINICAL NOTE AND INDICATIONS FOR SURGERY  The patient is a 81 year old male with a history of severe symptomatic bicuspid aortic valve stenosis and GERD suffering from NYHA class II dyspnea with lifestyle limiting symptoms.  During the course of the patient's preoperative work up they have been evaluated comprehensively by a multidisciplinary team of specialists coordinated through the [facility] in the [facility].  They have been demonstrated to suffer from symptomatic severe aortic stenosis as noted above. The patient has been counseled extensively as to the relative risks and benefits of all options for the treatment of severe aortic stenosis including long term medical therapy, conventional surgery for aortic valve replacement, and transcatheter aortic valve replacement.  The patient has been independently evaluated by Dr. Tenny Craw with CT surgery and they are felt to be at high risk for conventional surgical aortic valve replacement. The surgeon indicated the patient would be a poor candidate for conventional surgery. Based upon review of all of the patient's preoperative diagnostic tests  they are felt to be candidate for transcatheter aortic valve replacement using the transfemoral approach as an alternative to high risk conventional surgery.    Following the decision to proceed with transcatheter aortic valve replacement, a discussion has been held regarding what types of management strategies would be attempted intraoperatively in the event of life-threatening complications, including whether or not the patient would be considered a candidate for the use of cardiopulmonary bypass and/or conversion to open sternotomy for attempted surgical intervention.  The patient has been advised of a variety of complications that might develop peculiar to this approach including but not limited to risks of death, stroke, paravalvular leak, aortic dissection or other major vascular complications, aortic annulus rupture, device embolization, cardiac rupture or perforation, acute myocardial infarction, arrhythmia, heart block or bradycardia requiring permanent pacemaker placement, congestive heart failure, respiratory failure, renal failure, pneumonia, infection, other late complications related to structural valve deterioration or migration, or other complications that might ultimately cause a temporary or permanent loss of functional independence or other long term morbidity.  The patient provides full informed consent for the procedure as described and all questions were answered preoperatively.    DETAILS OF THE OPERATIVE PROCEDURE  PREPARATION:   The patient is brought to the operating room on the above mentioned date and central monitoring was established by the anesthesia team including placement of a radial arterial line. The patient is placed in the supine position on the operating table.  Intravenous antibiotics are administered. Conscious sedation is used.   Baseline transthoracic echocardiogram was performed. The patient's chest, abdomen, both groins, and both lower extremities are prepared  and draped in a sterile manner. A time out procedure is performed.   PERIPHERAL ACCESS:   Using the modified Seldinger technique, femoral arterial and venous access  were obtained with placement of a 6 Fr sheath in the artery and a 7 Fr sheath in the vein on the left and right sides using u/s guidance.  A pigtail diagnostic catheter was passed through the femoral arterial sheath under fluoroscopic guidance into the aortic root.  Aortic root angiography was performed in order to determine the optimal angiographic angle for valve deployment.  TRANSFEMORAL ACCESS:  A micropuncture kit was used to gain access to the right femoral artery using u/s guidance. Position confirmed with angiography. Pre-closure with double ProGlide closure devices. The patient was heparinized systemically and ACT verified > 250 seconds.    A 16 Fr transfemoral E-sheath was introduced into the right femoral artery after progressively dilating over an Amplatz superstiff wire. An AL-2 catheter was used to direct a straight-tip exchange length wire across the native aortic valve into the left ventricle. This was exchanged out for a pigtail catheter and position was confirmed in the LV apex. Simultaneous left ventricular, aortic, and left ventricular end-diastolic pressures were recorded.  The pigtail catheter was then exchanged for an Safari wire in the LV apex.  Direct LV pacing thresholds were assessed and found to be adequate.   BALLOON AORTIC VALVULOPLASTY: Due to a mean gradient across the aortic valve of greater than 50 mmHg a balloon aortic valvuloplasty with a 20 mm true balloon was performed with direct left ventricular rapid pacing.  TRANSCATHETER HEART VALVE DEPLOYMENT:  An Edwards Sapien 3 THV (size 29 mm) was prepared and crimped per manufacturer's guidelines, and the proper orientation of the valve is confirmed on the [employer] delivery system. The valve was advanced through the introducer sheath using normal  technique until in an appropriate position in the abdominal aorta beyond the sheath tip. The balloon was then retracted and using the fine-tuning wheel was centered on the valve. The valve was then advanced across the aortic arch using appropriate flexion of the catheter. The valve was carefully positioned across the aortic valve annulus. The Commander catheter was retracted using normal technique. Once final position of the valve has been confirmed by angiographic assessment, the valve is deployed while temporarily holding ventilation and during rapid ventricular pacing to maintain systolic blood pressure < 50 mmHg and pulse pressure < 10 mmHg. The balloon inflation is held for >3 seconds after reaching full deployment volume. Once the balloon has fully deflated the balloon is retracted into the ascending aorta and valve function is assessed using TTE. There is felt to be no paravalvular leak and no central aortic insufficiency.  The patient's hemodynamic recovery following valve deployment is good.  The deployment balloon and guidewire are both removed. Echo demostrated acceptable post-procedural gradients, stable mitral valve function, and no AI.   PROCEDURE COMPLETION:  The sheath was then removed and closure devices were completed. Protamine was administered once femoral arterial repair was complete. The temporary pacemaker, pigtail catheters and femoral sheaths were removed with a Mynx closure device placed in the artery and manual pressure used for venous hemostasis.    The patient tolerated the procedure well and is transported to the surgical intensive care in stable condition. There were no immediate intraoperative complications. All sponge instrument and needle counts are verified correct at completion of the operation.   No blood products were administered during the operation.  The patient received a total of 96 mL of intravenous contrast during the procedure.  Early Osmond  MD 07/22/2022 1:58 PM

## 2022-07-22 NOTE — H&P (Signed)
[address]       [place] [address]             [phone]                                        Erek Michael Mestre Wagoner Medical Record #[id number] Date of Birth: 12-02-1940   Referring: Minus Breeding, MD Primary Care: Inda Coke, [place] Primary Cardiologist:James Hochrein, MD   Chief Complaint:        Chief Complaint  Patient presents with   Aortic Stenosis      Surgical consult for TAVR, review all testing      History of Present Illness:     81 y.o. male here for for recommendations regarding his severe aortic stenosis.  He was recently seen by Dr. Percival Spanish and reported increasing fatigue, occasional chest discomfort, and dyspnea over the past year. Has not had any presyncope or syncope.   He does get short of breath and occasionally has chest pain when moving around heavy boxes.      Regarding the AS, he has Severe aortic stenosis with an aortic valve area of 0.75 cm2, mean gradient of 36 mmHg, and peak velocity of 4.2 m/s without ejection fraction of 60 to 65%; no conduction abnormalities.   He reports no history of aortic syndromes, hereditary aortopathy or sudden death in his family.          Past Medical History:  Diagnosis Date   Acne rosacea 01/02/2017   Arthralgia of left shoulder region 01/02/2017   Chronic insomnia 01/02/2017   Chronic midline low back pain without sciatica 01/02/2017   Gastroesophageal reflux disease without esophagitis 01/02/2017   Hyperlipidemia 11/28/2015   Macular degeneration 01/02/2017   Prostate cancer [facility])             Past Surgical History:  Procedure Laterality Date   ABDOMINAL AORTOGRAM N/A 06/06/2022    Procedure: ABDOMINAL AORTOGRAM;  Surgeon: Early Osmond, MD;  Location: [facility];  Service: Cardiovascular;  Laterality: N/A;   HERNIA REPAIR       PROSTATE SURGERY   2012   RIGHT/LEFT HEART CATH AND CORONARY ANGIOGRAPHY N/A 06/06/2022    Procedure: RIGHT/LEFT HEART CATH AND  CORONARY ANGIOGRAPHY;  Surgeon: Early Osmond, MD;  Location: [facility];  Service: Cardiovascular;  Laterality: N/A;      Social History        Tobacco Use  Smoking Status Former   Types: Pipe   Quit date: 03/20/1981   Years since quitting: 41.3  Smokeless Tobacco Never  Tobacco Comments    Quit in 1983 pipe smoker     Social History       Substance and Sexual Activity  Alcohol Use No        No Known Allergies         Current Outpatient Medications  Medication Sig Dispense Refill   aspirin EC 81 MG tablet Take 81 mg by mouth daily. Swallow whole.       Cholecalciferol (D3-1000 PO) Take 1,000 Units by mouth every other day.       CINNAMON PO Take 1,200 mg by mouth daily.       clobetasol cream (TEMOVATE) 0.05 % APPLY TOPICALLY TO THE AFFECTED AREA TWICE DAILY (Patient taking differently: Apply 1 Application topically 2 (two) times daily as needed (  irritation).) 45 g 1   cyanocobalamin (VITAMIN B12) 1000 MCG tablet Take 1,000 mcg by mouth daily.       Ginkgo Biloba 60 MG CAPS Take 60 mg by mouth daily.       Melatonin 10 MG TABS Take 10 mg by mouth at bedtime.       mirtazapine (REMERON) 30 MG tablet TAKE 1 TO 1 AND 1/2 TABLETS BY MOUTH AT NIGHT (Patient taking differently: Take 30-45 mg by mouth See admin instructions. Take 30 mg at bedtime, may increase to 45 mg as needed for severe insomnia) 135 tablet 0   Multiple Vitamins-Minerals (PRESERVISION AREDS 2+MULTI VIT PO) Take 2 capsules by mouth daily.       OVER THE COUNTER MEDICATION Take 4 tablets by mouth daily. Acuitol (Advanced Mental Acuity Support)       pantoprazole (PROTONIX) 40 MG tablet TAKE 1 TABLET(40 MG) BY MOUTH DAILY 90 tablet 3   traZODone (DESYREL) 150 MG tablet Take 50-100 mg by mouth See admin instructions. Take 50 mg at bedtime, may increase to 100 mg as needed for severe insomnia       Turmeric (QC TUMERIC COMPLEX PO) Take 1,000 mg by mouth daily.       Polyethyl Glycol-Propyl Glycol (SYSTANE  OP) Place 1 drop into both eyes daily as needed (dry eye).        No current facility-administered medications for this visit.      (Not in a hospital admission)          Family History  Problem Relation Age of Onset   Heart disease Mother          CHF   Hypertension Mother     Prostate cancer Father     Heart disease Maternal Grandmother     Stroke Maternal Grandmother          Review of Systems:    ROS   14 pt ros negative except as per above                Physical Exam: BP (!) 150/80   Pulse 65   Resp 20   Ht 5' 9.5" (1.765 m)   Wt 154 lb (69.9 kg)   SpO2 98% Comment: RA  BMI 22.42 kg/m    NAD Alert Nonlaboured resp Abd soft nd Extr wwp   Diagnostic Studies & Laboratory data:     Recent Radiology Findings:    Imaging Results (Last 48 hours)  No results found.     I have independently reviewed the above radiologic studies and discussed with the patient    Recent Lab Findings: Recent Labs       Lab Results  Component Value Date    WBC 4.8 07/18/2022    HGB 12.8 (L) 07/18/2022    HCT 37.8 (L) 07/18/2022    PLT 146 (L) 07/18/2022    GLUCOSE 79 07/18/2022    CHOL 181 07/24/2021    TRIG 89.0 07/24/2021    HDL 55.80 07/24/2021    LDLCALC 107 (H) 07/24/2021    ALT 32 07/18/2022    AST 41 07/18/2022    NA 142 07/18/2022    K 4.3 07/18/2022    CL 107 07/18/2022    CREATININE 1.43 (H) 07/18/2022    BUN 28 (H) 07/18/2022    CO2 29 07/18/2022    TSH 2.13 03/21/2020    INR 1.1 07/18/2022      CTA 9/23   ADDENDUM REPORT: 06/18/2022 12:45  CLINICAL DATA:  Aortic stenosis   EXAM: Cardiac TAVR CT   TECHNIQUE: The patient was scanned on a Siemens Force 408 slice scanner. A 120 kV retrospective scan was triggered in the descending thoracic aorta at 111 HU's. Gantry rotation speed was 270 msecs and collimation was .9 mm. No beta blockade or nitro were given. The 3D data set was reconstructed in 5% intervals of the R-R cycle. Systolic  and diastolic phases were analyzed on a dedicated work station using MPR, MIP and VRT modes. The patient received 80 cc of contrast.   FINDINGS: Aortic Valve: Calcified bicuspid with fused right/left cusp and calcified raphe calcium score 7477   Aorta: Minimal calcified atherosclerosis normal arch vessels no coarctation   Sinotubular Junction: 30 mm   Ascending Thoracic Aorta: 41 mm   Aortic Arch: 25 mm   Descending Thoracic Aorta: 23 mm   Non commisural sinus 43 mm   Commisural sinus 32 mm   Coronary Artery Height above Annulus:   Left Main: 23 mm above annulus   Right Coronary: 15.5 mm above annulus   Virtual Basal Annulus Measurements:   Maximum/Minimum Diameter: 28.7 mm x 28.5 mm   Perimeter: 95.6 mm   Area: 705 mm2   Coronary Arteries: Sufficient height above annulus for deployment   Optimum Fluoroscopic Angle for Delivery: LAO 14 Caudal 14 degrees   IMPRESSION: 1. 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 Annular area of 705 mm2 slightly over upper limit for 29 mm Sapien 3 valve but given bicuspid valve with calcified raphe suspect that would be good valve choice   3.  Coronary arteries sufficient height above annulus for deployment   4. Optimum angiographic angle for deployment LAO 14 Caudal 14 degrees   4.  Membranous septal length 6 mm   Given age, bicuspid valve and aneurysmal sinus/ascending thoracic aorta consideration for SAVR with root replacement would be appropriate as well   Jenkins Rouge     Electronically Signed   By: Jenkins Rouge M.D.   On: 06/18/2022 12:45  TAVR evaluation   EXAM: CT ANGIOGRAPHY CHEST, ABDOMEN AND PELVIS   TECHNIQUE: Multidetector CT imaging through the chest, abdomen and pelvis was performed using the standard protocol during bolus administration of intravenous contrast. Multiplanar reconstructed images and MIPs were obtained and reviewed to evaluate the vascular  anatomy.   RADIATION DOSE REDUCTION: This exam was performed according to the departmental dose-optimization program which includes automated exposure control, adjustment of the mA and/or kV according to patient size and/or use of iterative reconstruction technique.   CONTRAST:  113m OMNIPAQUE IOHEXOL 350 MG/ML SOLN   COMPARISON:  None Available.   FINDINGS: CTA CHEST FINDINGS   Cardiovascular: Heart size. No pericardial effusion. Severe aortic valve thickening and calcifications. Normal caliber thoracic aorta with mild atherosclerotic disease. No suspicious filling defects of the central pulmonary arteries. No visualized coronary artery calcifications.   Mediastinum/Nodes: Moderate hiatal hernia and patulous esophagus. Thyroid is unremarkable. Right-greater-than-left enlarged hilar lymph nodes reference right hilar lymph node measuring 1.7 cm short axis on series 4, image 71. Reference left hilar lymph node measuring 1.1 cm on image 77.   Lungs/Pleura: Central airways are patent. No consolidation, pleural effusion or pneumothorax. Numerous small solid pulmonary nodules, largest measures 5 mm and is located in the right lower lobe. Small fat containing left-sided Bochdalek hernia.   Musculoskeletal: No chest wall abnormality. No acute or significant osseous findings.   CTA ABDOMEN  AND PELVIS FINDINGS   Hepatobiliary: Numerous scattered low-attenuation liver lesions which are compatible with simple cysts. Gallbladder is unremarkable. No biliary ductal dilation.   Pancreas: Unremarkable. No pancreatic ductal dilatation or surrounding inflammatory changes.   Spleen: Normal in size without focal abnormality.   Adrenals/Urinary Tract: Bilateral adrenal glands are unremarkable. No hydronephrosis or nephrolithiasis. Simple appearing right renal cysts, no specific follow-up imaging is recommended. Bladder is unremarkable.   Stomach/Bowel: Stomach is within normal limits.  Mild diverticulosis. No evidence of bowel wall thickening, distention, or inflammatory changes.   Vascular/lymphatic: Normal caliber abdominal aorta with mild atherosclerotic disease. No significant stenosis. Pathologically enlarged lymph nodes seen in the abdomen or pelvis   Reproductive: Prostate is not visualized may be atrophic surgically absent.   Other: No abdominal wall hernia or abnormality. No abdominopelvic ascites.   Musculoskeletal: No acute or significant osseous findings.   VASCULAR MEASUREMENTS PERTINENT TO TAVR:   AORTA:   Minimal Aortic Diameter-12.6 mm   Severity of Aortic Calcification-mild   RIGHT PELVIS:   Right Common Iliac Artery -   Minimal Diameter-7.5 mm   Tortuosity-mild   Calcification-mild   Right External Iliac Artery -   Minimal Diameter-7.7 mm   Tortuosity-mild   Calcification-none   Right Common Femoral Artery -   Minimal Diameter-7.8 mm   Tortuosity-mild   Calcification-mild   LEFT PELVIS:   Left Common Iliac Artery -   Minimal Diameter-9.2 mm   Tortuosity-mild   Calcification-mild   Left External Iliac Artery -   Minimal Diameter-7.7 mm   Tortuosity-mild   Calcification-none   Left Common Femoral Artery -   Minimal Diameter-8.8 mm   Tortuosity-none   Calcification-mild   Review of the MIP images confirms the above findings.   IMPRESSION: 1. Vascular findings and measurements pertinent to potential TAVR procedure, as detailed above. 2. Thickening and calcification of the aortic valve, compatible with reported clinical history of aortic stenosis. 3. Mild aortoiliac atherosclerosis. 4. Enlarged right-greater-than-left hilar lymph nodes, possibly reactive. Recommend follow-up chest CT in 3 months to ensure stability or resolution. 5. Multiple small solid pulmonary nodules, largest measures 5 mm. No follow-up needed if patient is low-risk (and has no known or suspected primary neoplasm).  Non-contrast chest CT can be considered in 12 months if patient is high-risk. This recommendation follows the consensus statement: Guidelines for Management of Incidental Pulmonary Nodules Detected on CT Images: From the Fleischner Society 2017; Radiology 2017; 284:228-243.       Assessment / Plan:   57 M presents for symptomatic severe aortic stenosis.  R/L fusion bicuspid associated with a 4.2cm Aorta Meets criteria for AV replacement. 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 STS risk is 1%.  He is up to date with dental care.  We discussed him in multidisciplinary valve clinic and found his anatomy to be feasible for TAVR.     The patient is interested in TAVR at this time. He understands that if his aortic diameter increases, he may meet criteria for intervention on that in the future. Though at 73, the window for needing that done is limited.    He would be a candidate for surgical bailout if needed. Valve: likely 29 mm Edwards S3. Our read on the area is 705 mm2. Industry is 623m2. The recommended range for a 29S3 is 540-6879m.   We may need to add extra to the inflation.  Approach: Transfemoral NYHA class: II     DaJustice RocherD  CV Surgery

## 2022-07-22 NOTE — Progress Notes (Signed)
  Echocardiogram 2D Echocardiogram has been performed.  Harold Leach 07/22/2022, 1:34 PM

## 2022-07-22 NOTE — Discharge Summary (Signed)
[facility] VALVE TEAM  Discharge Summary    Patient ID: Harold Leach MRN: [id number]; DOB: 10-22-1940  Admit date: 07/22/2022 Discharge date: 07/23/2022  Primary Care Provider: Inda Coke, [place]  Primary Cardiologist: Minus Breeding, MD / Dr. Ali Lowe & Dr. Tenny Craw (TAVR)  Discharge Diagnoses    Principal Problem:   S/P TAVR (transcatheter aortic valve replacement) Active Problems:   Macular degeneration   Gastroesophageal reflux disease without esophagitis   Hyperlipidemia   Aortic stenosis, severe   Prostate cancer ([place])  Allergies No Known Allergies  Diagnostic Studies/Procedures    TAVR OPERATIVE NOTE     Date of Procedure:                07/22/2022   Preoperative Diagnosis:      Severe Aortic Stenosis    Postoperative Diagnosis:    Same    Procedure:        Transcatheter Aortic Valve Replacement - Transfemoral Approach             Edwards Sapien 3 Resilia THV (size 29 mm, model # 9755RLS, serial # [id number])              Co-Surgeons:                         Justice Rocher, MD and Lenna Sciara, MD Anesthesiologist:                  Rudean Haskell, MD   Echocardiographer:              Rudean Haskell, MD   Pre-operative Echo Findings: Severe aortic stenosis Normal left ventricular systolic function   Post-operative Echo Findings: Trace paravalvular leak Normal left ventricular systolic function   Left Heart Catheterization Findings: Left ventricular end-diastolic pressure of 24 mmHg   _____________   Echo 07/23/22: Completed but pending formal read at the time of discharge   History of Present Illness     Harold Leach is a 80 y.o. male with a history of prostate cancer, HLD, GERD and severe bicuspid aortic valve stenosis who presented to [facility] on 07/22/22 for planned TAVR.   He was recently seen by Dr. Percival Spanish and reported increasing fatigue, occasional chest discomfort, and  dyspnea. Echo 01/28/22 showed EF 60% and severe AS with a mean grad 34.2 mmHg, peak grad 62.5 mmHg, AVA 0.76 cm2, DVI 0.17 as well as mild to moderate MR. [facility], [place] 06/06/22 showed normal cors.  He was evaluated by the multidisciplinary valve team and felt to have severe, symptomatic aortic stenosis and to be a suitable candidate for TAVR, which was set up for 07/22/22.  Hospital Course     Consultants: none   Severe AS: s/p successful TAVR with a 29 mm Edwards Sapien 3 Ultra Resilia THV via the TF approach on 07/22/22. Post operative echo pending. Groin sites are stable. ECG with NSR and no high grade heart block. Continued on Asprin '81mg'$  daily. Plan for discharge home today with close follow up in the outpatient setting. Post TAVR instructions reviewed with the patient with understanding. Will require lifelong dental SBE.   Acute on chronic diastolic CHF: LVEDP elevated ~24 at the time of TAVR. He was treated with one dose of IV lasix.   Lymphadenopathy: pre TAVR CTs reported "enlarged right-greater-than-left hilar lymph nodes, possibly reactive. Recommend follow-up chest CT in 3 months to ensure stability or resolution." This will be discussed in the  outpatient setting.   Pulmonary nodules: pre TAVR CT reported "multiple small solid pulmonary nodules, largest measures 5 mm. No follow-up needed if patient is low-risk (and has no known or suspected primary neoplasm). Non-contrast chest CT can be considered in 12 months if patient is high-risk." This will be discussed in the outpatient setting.  _____________  Discharge Vitals Blood pressure (!) 105/51, pulse (!) 58, temperature 97.6 F (36.4 C), temperature source Oral, resp. rate 17, height 5' 9.5" (1.765 m), weight 69.5 kg, SpO2 95 %.  Filed Weights   07/22/22 0822 07/23/22 0507  Weight: 68 kg 69.5 kg   General: Well developed, well nourished, NAD Lungs:Clear to ausculation bilaterally. No wheezes, rales, or rhonchi. Breathing is  unlabored. Cardiovascular: RRR with S1 S2. No murmurs Extremities: No edema. Groin sites stable.  Neuro: Alert and oriented. No focal deficits. No facial asymmetry. MAE spontaneously. Psych: Responds to questions appropriately with normal affect.    Labs & Radiologic Studies    CBC Recent Labs    07/22/22 1413 07/23/22 0152  WBC  --  7.5  HGB 11.2* 11.9*  HCT 33.0* 34.8*  MCV  --  87.7  PLT  --  846*   Basic Metabolic Panel Recent Labs    07/22/22 1413 07/23/22 0152  NA 140 139  K 4.2 4.0  CL 105 103  CO2  --  24  GLUCOSE 123* 95  BUN 22 23  CREATININE 1.10 1.46*  CALCIUM  --  8.9  MG  --  1.9   Liver Function Tests No results for input(s): "AST", "ALT", "ALKPHOS", "BILITOT", "PROT", "ALBUMIN" in the last 72 hours. No results for input(s): "LIPASE", "AMYLASE" in the last 72 hours. Cardiac Enzymes No results for input(s): "CKTOTAL", "CKMB", "CKMBINDEX", "TROPONINI" in the last 72 hours. BNP Invalid input(s): "POCBNP" D-Dimer No results for input(s): "DDIMER" in the last 72 hours. Hemoglobin A1C No results for input(s): "HGBA1C" in the last 72 hours. Fasting Lipid Panel No results for input(s): "CHOL", "HDL", "LDLCALC", "TRIG", "CHOLHDL", "LDLDIRECT" in the last 72 hours. Thyroid Function Tests No results for input(s): "TSH", "T4TOTAL", "T3FREE", "THYROIDAB" in the last 72 hours.  Invalid input(s): "FREET3" _____________  ECHOCARDIOGRAM LIMITED  Result Date: 07/22/2022    ECHOCARDIOGRAM LIMITED REPORT   Patient Name:   Harold Leach [facility] Date of Exam: 07/22/2022 Medical Rec #:  [id number]             Height:       69.5 in Accession #:    [id number]            Weight:       150.0 lb Date of Birth:  11/10/1940            BSA:          1.837 m Patient Age:    11 years              BP:           146/82 mmHg Patient Gender: M                     HR:           48 bpm. Exam Location:  Inpatient Procedure: Limited Echo, Cardiac Doppler and Color Doppler Indications:      I35.0 Nonrheumatic aortic (valve) stenosis  History:         Patient has prior history of Echocardiogram examinations, most  recent 01/28/2022. Aortic Valve Disease; Risk                  Factors:Dyslipidemia. Aortic stenosis. Cancer.                  Aortic Valve: 29 mm Sapien prosthetic, stented (TAVR) valve is                  present in the aortic position. Procedure Date: 07/22/2022.  Sonographer:     Roseanna Rainbow RDCS Referring Phys:  [id number] Early [place] Diagnosing Phys: Rudean Haskell MD IMPRESSIONS  1. Interventional TTE for TAVR placement.  2. Prior to procedure, Siever's I Bicuspid Valve. Mild AI. Severe AS. Mild aortic dilation. Mean gradient 25 mm Hg. Peak gradient 44 mm Hg. DVI 0.25. AVA 1.24 cm2.  3. During the procedure, increased ectopy with pigtail in LV cavity. Though the pigtail is in the LV apex, there was not interaction with the chordae. No pericardial effusion.  4. After the procedure, there was a 29 mm Sapien Valve. Slight supra-annular deploment. No change in aortic dimensions. Trace PVL at the 12 o'clock position. mean gradient 2 mm Hg. Peak gradient 3 mm Hg. Normal DVI. EOA 4.41 cm2.  5. Left ventricular ejection fraction, by estimation, is 60 to 65%. The left ventricle has normal function.  6. The mitral valve is normal in structure. Mild to moderate mitral valve regurgitation. No evidence of mitral stenosis.  7. Aortic valve regurgitation is mild. There is a 29 mm Sapien prosthetic (TAVR) valve present in the aortic position. Procedure Date: 07/22/2022.  8. Aortic dilatation noted. There is mild dilatation of the ascending aorta, measuring 44 mm. Comparison(s): Successful TAVR placement. FINDINGS  Left Ventricle: Left ventricular ejection fraction, by estimation, is 60 to 65%. The left ventricle has normal function. Mitral Valve: The mitral valve is normal in structure. Mild to moderate mitral valve regurgitation. No evidence of mitral valve stenosis. Tricuspid  Valve: The tricuspid valve is normal in structure. Tricuspid valve regurgitation is not demonstrated. No evidence of tricuspid stenosis. Aortic Valve: Aortic valve regurgitation is mild. Aortic valve mean gradient measures 2.7 mmHg. Aortic valve peak gradient measures 4.9 mmHg. Aortic valve area, by VTI measures 3.75 cm. There is a 29 mm Sapien prosthetic, stented (TAVR) valve present in  the aortic position. Procedure Date: 07/22/2022. Aorta: Aortic dilatation noted. There is mild dilatation of the ascending aorta, measuring 44 mm. LEFT VENTRICLE PLAX 2D LVOT diam:     2.50 cm LV SV:         99 LV SV Index:   54 LVOT Area:     4.91 cm  LV Volumes (MOD) LV vol d, MOD A2C: 81.8 ml LV vol d, MOD A4C: 79.1 ml LV vol s, MOD A2C: 31.2 ml LV vol s, MOD A4C: 32.4 ml LV SV MOD A2C:     50.6 ml LV SV MOD A4C:     79.1 ml LV SV MOD BP:      53.2 ml AORTIC VALVE AV Area (Vmax):    4.08 cm AV Area (Vmean):   4.20 cm AV Area (VTI):     3.75 cm AV Vmax:           110.97 cm/s AV Vmean:          74.800 cm/s AV VTI:            0.263 m AV Peak Grad:      4.9 mmHg AV Mean Grad:  2.7 mmHg LVOT Vmax:         92.30 cm/s LVOT Vmean:        64.000 cm/s LVOT VTI:          0.201 m LVOT/AV VTI ratio: 0.76  SHUNTS Systemic VTI:  0.20 m Systemic Diam: 2.50 cm Rudean Haskell MD Electronically signed by Rudean Haskell MD Signature Date/Time: 07/22/2022/2:08:14 PM    Final    Structural Heart Procedure  Result Date: 07/22/2022 See surgical note for result.  DG Chest 2 View  Result Date: 07/21/2022 CLINICAL DATA:  [id number] Pre-op exam [id number] EXAM: CHEST - 2 VIEW COMPARISON:  CT chest September 2022 FINDINGS: Cardiomediastinal silhouette is within normal limits. No pleural effusion. No pneumothorax. Small fat containing left diaphragmatic hernia, as seen on recent CT chest. Healed left-sided rib fracture. IMPRESSION: No acute findings in the chest. Electronically Signed   By: Albin Felling M.D.   On: 07/21/2022 08:54    Disposition   Pt is being discharged home today in good condition.  Follow-up Plans & Appointments    Follow-up Information     Eileen Stanford, PA-C. Go on 08/06/2022.   Specialties: Cardiology, Radiology Why: @ 1:30pm, please arrive at least 10 minutes early. Contact information: [address] [place] [address] [phone]                Discharge Instructions     Call MD for:  difficulty breathing, headache or visual disturbances   Complete by: As directed    Call MD for:  extreme fatigue   Complete by: As directed    Call MD for:  hives   Complete by: As directed    Call MD for:  persistant dizziness or light-headedness   Complete by: As directed    Call MD for:  persistant nausea and vomiting   Complete by: As directed    Call MD for:  redness, tenderness, or signs of infection (pain, swelling, redness, odor or green/yellow discharge around incision site)   Complete by: As directed    Call MD for:  severe uncontrolled pain   Complete by: As directed    Call MD for:  temperature >100.4   Complete by: As directed    Diet - low sodium heart healthy   Complete by: As directed    Discharge instructions   Complete by: As directed    ACTIVITY AND EXERCISE  Daily activity and exercise are an important part of your recovery. People recover at different rates depending on their general health and type of valve procedure.  Most people recovering from TAVR feel better relatively quickly   No lifting, pushing, pulling more than 10 pounds (examples to avoid: groceries, vacuuming, gardening, golfing):             - For one week with a procedure through the groin.             - For six weeks for procedures through the chest wall or neck. NOTE: You will typically see one of our providers 7-14 days after your procedure to discuss [place] the above activities.      DRIVING  Do not drive until you are seen for follow up and cleared by a  provider. Generally, we ask patient to not drive for 1 week after their procedure.  If you have been told by your doctor in the past that you may not drive, you must talk with him/her before you begin driving  again.   DRESSING  Groin site: you may leave the clear dressing over the site for up to one week or until it falls off.   HYGIENE  If you had a femoral (leg) procedure, you may take a shower when you return home. After the shower, pat the site dry. Do NOT use powder, oils or lotions in your groin area until the site has completely healed.  If you had a chest procedure, you may shower when you return home unless specifically instructed not to by your discharging practitioner.             - DO NOT scrub incision; pat dry with a towel.             - DO NOT apply any lotions, oils, powders to the incision.             - No tub baths / swimming for at least 2 weeks.  If you notice any fevers, chills, increased pain, swelling, bleeding or pus, please contact your doctor.   ADDITIONAL INFORMATION  If you are going to have an upcoming dental procedure, please contact our office as you will require antibiotics ahead of time to prevent infection on your heart valve.    If you have any questions or concerns you can call the structural heart phone during normal business hours 8am-4pm. If you have an urgent need after hours or weekends please call [phone] to talk to the on call provider for general cardiology. If you have an emergency that requires immediate attention, please call 911.    After TAVR Checklist  Check  Test Description  Follow up appointment in 1-2 weeks  You will see our structural heart advanced practice provider. Your incision sites will be checked and you will be cleared to drive and resume all normal activities if you are doing well.    1 month echo and follow up  You will have an echo to check on your new heart valve and be seen back in the office by a structural  heart advanced practice provider.  Follow up with your primary cardiologist You will need to be seen by your primary cardiologist in the following 3-6 months after your 1 month appointment in the valve clinic.   1 year echo and follow up You will have another echo to check on your heart valve after 1 year and be seen back in the office by a structural heart advanced practice provider. This your last structural heart visit.  Bacterial endocarditis prophylaxis  You will have to take antibiotics for the rest of your life before all dental procedures (even teeth cleanings) to protect your heart valve. Antibiotics are also required before some surgeries. Please check with your cardiologist before scheduling any surgeries. Also, please make sure to tell us if you have a penicillin allergy as you will require an alternative antibiotic.   Discharge wound care:   Complete by: As directed    May remove dressing tomorrow if irritating to the skin and replace with a Bandaid or leave open to air.   Increase activity slowly   Complete by: As directed       Discharge Medications   Allergies as of 07/23/2022   No Known Allergies      Medication List     TAKE these medications    aspirin EC 81 MG tablet Take 81 mg by mouth daily. Swallow whole.   CINNAMON PO Take 1,200 mg by mouth daily.  clobetasol cream 0.05 % Commonly known as: TEMOVATE APPLY TOPICALLY TO THE AFFECTED AREA TWICE DAILY What changed:  how much to take how to take this when to take this reasons to take this additional instructions   cyanocobalamin 1000 MCG tablet Commonly known as: VITAMIN B12 Take 1,000 mcg by mouth daily.   D3-1000 PO Take 1,000 Units by mouth every other day.   Ginkgo Biloba 60 MG Caps Take 60 mg by mouth daily.   Melatonin 10 MG Tabs Take 10 mg by mouth at bedtime.   mirtazapine 30 MG tablet Commonly known as: REMERON TAKE 1 TO 1 AND 1/2 TABLETS BY MOUTH AT NIGHT What changed:  how much to  take how to take this when to take this additional instructions   OVER THE COUNTER MEDICATION Take 4 tablets by mouth daily. Acuitol (Advanced Mental Acuity Support)   pantoprazole 40 MG tablet Commonly known as: PROTONIX TAKE 1 TABLET(40 MG) BY MOUTH DAILY   PRESERVISION AREDS 2+MULTI VIT PO Take 2 capsules by mouth daily.   QC TUMERIC COMPLEX PO Take 1,000 mg by mouth daily.   SYSTANE OP Place 1 drop into both eyes daily as needed (dry eye).   traZODone 150 MG tablet Commonly known as: DESYREL Take 50-100 mg by mouth See admin instructions. Take 50 mg at bedtime, may increase to 100 mg as needed for severe insomnia               Discharge Care Instructions  (From admission, onward)           Start     Ordered   07/23/22 0000  Discharge wound care:       Comments: May remove dressing tomorrow if irritating to the skin and replace with a Bandaid or leave open to air.   07/23/22 0933            Outstanding Labs/Studies   None   Duration of Discharge Encounter   Greater than 30 minutes including physician time.  SignedKathyrn Drown, NP 07/23/2022, 9:33 AM [phone]

## 2022-07-22 NOTE — Progress Notes (Addendum)
  [place] VALVE TEAM  Patient doing well s/p TAVR. He is hemodynamically stable. Groin sites stable. ECG with sinus brady and new 1st deg AV block but no high grade block.  LVEDP elevated at the time of TAVR. Will treat with one dose of IV lasix and follow. Plan to DC arterial line and transfer to 4E. Plan for early ambulation after bedrest completed and hopeful discharge over the next 24-48 hours.   Angelena Form PA-C  [facility]  Pager [phone]

## 2022-07-22 NOTE — Op Note (Signed)
  Operative Note: Patient Name: Harold Leach Date of Birth: Sep 13, 1941 Date of Operation: 07/22/22   Operation: 80 S3 Sapien, Transfemoral Balloon valvuloplasty (6m True)   Surgeon: DJustice RocherMD   Co-Surgeon: ALenna SciaraMD   Findings: Excellent implant depth Trace AI Initial gradient across valve 50-564mg    Description of Procedure: The patient was brought in the operating room and laid in supine position.  The patient was prepped and draped in standard fashion.  Arterial and venous lines were placed by anesthesia. Patient was sedated. Left femoral arterial access was gained and checked with contrast. Right femoral arterial access gained and checked with contrast. Right femoral venous access gained and small Fr sheath inserted. Left french arterial pigtail was advanced into the aortic sinuses. Right femoral arterial two percloses were used. Pigtail on a stiff wire was navigated up to the aortic root. Edwards large bore sheath had been placed. LV pacing equipment was set up as well. We crossed the valve with an AL and straight wire. The angle across the vale looked awkward at an acute angle. We crossed again and it looked similar. We checked on TTE and it was  in good position and we were not entangled with the mitral apparatus. We then exchanged to a safari wire and brought up a 20 True BAV (as it was a type I bicuspid and the gradient was greater than 5069m) device and inflated it to an angiographic result. We then advanced the 55m3m Sapien TAVR system into descending aorta. 50% flex and valve was prepared and it was then advanced into the aortic valve and it was deployed when pacing. Balloon was up for 3 seconds fully inflated. Checked by echo and the valve had trivial AR and implant depth was good. We then removed the TAVR system at 0% flex and then removed the large bore Edwards sheath and used the 2 percloses. One of the percloses broke and the other looked to have  good seal. We then did an angiogram of the right leg and there was no significant obstruction. Manual pressure had been held and protamine given as well. Defer to Dr. ThukAli Lowe treatment of remaining arterial and venous small bore sheaths. Patient was moved to recovery area in stable condition.    DaniJustice RocherCV Surgery

## 2022-07-22 NOTE — Interval H&P Note (Signed)
History and Physical Interval Note:  07/22/2022 9:45 AM  Harold Leach  has presented today for surgery, with the diagnosis of Severe Aortic Stenosis.  The various methods of treatment have been discussed with the patient and family. After consideration of risks, benefits and other options for treatment, the patient has consented to  Procedure(s): Transcatheter Aortic Valve Replacement, Transfemoral (N/A) INTRAOPERATIVE TRANSTHORACIC ECHOCARDIOGRAM (N/A) as a surgical intervention.  The patient's history has been reviewed, patient examined, no change in status, stable for surgery.  I have reviewed the patient's chart and labs.  Questions were answered to the patient's satisfaction.     Pierre Bali Charlesia Canaday

## 2022-07-22 NOTE — Progress Notes (Signed)
C/O slight nausea. Medicated w/Zofran

## 2022-07-22 NOTE — Progress Notes (Signed)
Pt arrived to unit from  cath lab  A/O x 4,  CCMD called ,CHG given, pt oriented to unit,Will continue to monitor. Bilateral groin level 0  Phoebe Sharps, RN

## 2022-07-22 NOTE — Anesthesia Procedure Notes (Addendum)
Procedure Name: MAC Date/Time: 07/22/2022 11:32 AM  Performed by: Amadeo Garnet, CRNAPre-anesthesia Checklist: Patient identified, Emergency Drugs available, Suction available and Patient being monitored Patient Re-evaluated:Patient Re-evaluated prior to induction Oxygen Delivery Method: Simple face mask Preoxygenation: Pre-oxygenation with 100% oxygen Induction Type: IV induction Placement Confirmation: positive ETCO2

## 2022-07-23 ENCOUNTER — Inpatient Hospital Stay (HOSPITAL_COMMUNITY): Payer: Medicare Other

## 2022-07-23 ENCOUNTER — Encounter (HOSPITAL_COMMUNITY): Payer: Self-pay | Admitting: Internal Medicine

## 2022-07-23 DIAGNOSIS — I350 Nonrheumatic aortic (valve) stenosis: Secondary | ICD-10-CM | POA: Diagnosis not present

## 2022-07-23 DIAGNOSIS — I700 Atherosclerosis of aorta: Secondary | ICD-10-CM | POA: Diagnosis not present

## 2022-07-23 DIAGNOSIS — Z952 Presence of prosthetic heart valve: Secondary | ICD-10-CM

## 2022-07-23 DIAGNOSIS — Q231 Congenital insufficiency of aortic valve: Secondary | ICD-10-CM

## 2022-07-23 DIAGNOSIS — I5033 Acute on chronic diastolic (congestive) heart failure: Secondary | ICD-10-CM | POA: Diagnosis not present

## 2022-07-23 DIAGNOSIS — Z006 Encounter for examination for normal comparison and control in clinical research program: Secondary | ICD-10-CM | POA: Diagnosis not present

## 2022-07-23 LAB — BASIC METABOLIC PANEL
Anion gap: 12 (ref 5–15)
BUN: 23 mg/dL (ref 8–23)
CO2: 24 mmol/L (ref 22–32)
Calcium: 8.9 mg/dL (ref 8.9–10.3)
Chloride: 103 mmol/L (ref 98–111)
Creatinine, Ser: 1.46 mg/dL — ABNORMAL HIGH (ref 0.61–1.24)
GFR, Estimated: 49 mL/min — ABNORMAL LOW (ref >60)
Glucose, Bld: 95 mg/dL (ref 70–99)
Potassium: 4 mmol/L (ref 3.5–5.1)
Sodium: 139 mmol/L (ref 135–145)

## 2022-07-23 LAB — CBC
HCT: 34.8 % — ABNORMAL LOW (ref 39.0–52.0)
Hemoglobin: 11.9 g/dL — ABNORMAL LOW (ref 13.0–17.0)
MCH: 30 pg (ref 26.0–34.0)
MCHC: 34.2 g/dL (ref 30.0–36.0)
MCV: 87.7 fL (ref 80.0–100.0)
Platelets: 118 10*3/uL — ABNORMAL LOW (ref 150–400)
RBC: 3.97 MIL/uL — ABNORMAL LOW (ref 4.22–5.81)
RDW: 14.1 % (ref 11.5–15.5)
WBC: 7.5 10*3/uL (ref 4.0–10.5)
nRBC: 0 % (ref 0.0–0.2)

## 2022-07-23 LAB — ECHOCARDIOGRAM COMPLETE
AR max vel: 5.3 cm2
AV Area VTI: 4.81 cm2
AV Area mean vel: 4.46 cm2
AV Mean grad: 6 mmHg
AV Peak grad: 9.2 mmHg
Ao pk vel: 1.52 m/s
Area-P 1/2: 3.85 cm2
Calc EF: 60 %
Height: 69.5 in
Single Plane A2C EF: 65 %
Single Plane A4C EF: 60.3 %
Weight: 2452.8 oz

## 2022-07-23 LAB — MAGNESIUM: Magnesium: 1.9 mg/dL (ref 1.7–2.4)

## 2022-07-23 NOTE — Progress Notes (Signed)
  Echocardiogram 2D Echocardiogram has been performed.  Harold Leach 07/23/2022, 11:36 AM

## 2022-07-23 NOTE — Plan of Care (Signed)

## 2022-07-23 NOTE — Progress Notes (Signed)
CARDIAC REHAB PHASE I     Ambulated well with mobility. Back to room to chair with call bell and bedside table in reach. Post TAVR education including site care, restrictions, heart healthy diet, risk factors, exercise guidelines and CRP2 reviewed. All questions and concerns addressed.  Not interested in CRP2 at this time. Plan for home today.   [id number]  Vanessa Barbara, RN BSN 07/23/2022 10:04 AM

## 2022-07-23 NOTE — Progress Notes (Signed)
Patient and spouse given discharge instructions and verbalized understanding. PIV x2 removed. All belongings with patient. He is ready for discharge after he eats lunch that just arrived.

## 2022-07-23 NOTE — Progress Notes (Signed)
Mobility Specialist Progress Note:   07/23/22 0950  Mobility  Activity Ambulated with assistance in hallway  Level of Assistance Contact guard assist, steadying assist  Assistive Device None  Distance Ambulated (ft) 550 ft  Activity Response Tolerated well  $Mobility charge 1 Mobility   During Mobility: 78 HR Post Mobility:  71 HR  Pt received in bed willing to participate in mobility. Complaints of soreness in groin sites. Left in chair with call bell in reach and all needs met.   [facility] Surveyor, mining Chat only

## 2022-07-23 NOTE — Progress Notes (Signed)
Dictation on: 07/23/2022  9:23 AM by: Neomia Glass [[id number]]

## 2022-07-23 NOTE — Plan of Care (Signed)

## 2022-07-23 NOTE — Anesthesia Postprocedure Evaluation (Signed)
Anesthesia Post Note  Patient: Harold Leach  Procedure(s) Performed: Transcatheter Aortic Valve Replacement, Transfemoral (Chest) INTRAOPERATIVE TRANSTHORACIC ECHOCARDIOGRAM ULTRASOUND GUIDANCE FOR VASCULAR ACCESS BALLOON AORTIC VALVE VALVULOPLASTY     Patient location during evaluation: PACU Anesthesia Type: MAC Level of consciousness: awake and alert Pain management: pain level controlled Vital Signs Assessment: post-procedure vital signs reviewed and stable Respiratory status: spontaneous breathing, nonlabored ventilation, respiratory function stable and patient connected to nasal cannula oxygen Cardiovascular status: stable and blood pressure returned to baseline Postop Assessment: no apparent nausea or vomiting Anesthetic complications: no   No notable events documented.  Last Vitals:  Vitals:   07/23/22 0641 07/23/22 0749  BP: (!) 99/54 (!) 105/51  Pulse: (!) 58   Resp: 17   Temp: 36.9 C 36.4 C  SpO2: 95% 95%    Last Pain:  Vitals:   07/23/22 0826  TempSrc:   PainSc: 0-No pain                 [name]

## 2022-07-24 ENCOUNTER — Telehealth: Payer: Self-pay | Admitting: Cardiology

## 2022-07-24 ENCOUNTER — Telehealth: Payer: Self-pay

## 2022-07-24 NOTE — Telephone Encounter (Signed)
  [facility]   MULTIDISCIPLINARY HEART VALVE TEAM   Attempted TOC post TAVR call however with no answer. Left message for patient to return my call. Will try him again tomorrow.   Kathyrn Drown NP-C Structural Heart Team  Pager: [phone]

## 2022-07-24 NOTE — Telephone Encounter (Signed)
Transition Care Management Follow-up Telephone Call Date of discharge and from where: [facility] 07/22/2022 How have you been since you were released from the hospital? good Any questions or concerns? No  Items Reviewed: Did the pt receive and understand the discharge instructions provided? Yes  Medications obtained and verified? Yes  Other? Yes  Any new allergies since your discharge? No  Dietary orders reviewed? Yes Do you have support at home? Yes   Home Care and Equipment/Supplies: Were home health services ordered? no If so, what is the name of the agency? N/a  Has the agency set up a time to come to the patient's home? not applicable Were any new equipment or medical supplies ordered?  No What is the name of the medical supply agency? N/a Were you able to get the supplies/equipment? not applicable Do you have any questions related to the use of the equipment or supplies? No  Functional Questionnaire: (I = Independent and D = Dependent) ADLs: I  Bathing/Dressing- I  Meal Prep- I  Eating- I  Maintaining continence- I  Transferring/Ambulation- I  Managing Meds- I  Follow up appointments reviewed:  PCP Hospital f/u appt confirmed? Yes  Scheduled to see Inda Coke on 07/25/2022 @ 9:00. [facility] f/u appt confirmed? Yes  Scheduled to see Dr Grandville Silos on 08/06/2022 @ 1:30. Are transportation arrangements needed? No  If their condition worsens, is the pt aware to call PCP or go to the Emergency Dept.? Yes Was the patient provided with contact information for the PCP's office or ED? Yes Was to pt encouraged to call back with questions or concerns? Yes  Juanda Crumble, LPN [place] Direct Dial [phone]

## 2022-07-24 NOTE — Progress Notes (Addendum)
Subjective:    Harold Leach is a 81 y.o. male and is here for a comprehensive physical exam.  HPI  There are no preventive care reminders to display for this patient.  Acute Concerns: Right cerumen impaction -- has cerumen that he would like assessed. Tries to keep his ears clean regularly.  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 Hasn't noticed much of a significant difference yet in regards to energy levels or SOB.  Chronic Issues: Insomnia -- remeron 30 mg + trazodone 50 mg + melatonin 10 mg  GERD -- currently taking protonix 40 mg and tolerating well.  Hx of prostate cancer -- denies concerns.   Health Maintenance: Immunizations -- needs updated COVID Colonoscopy -- due in 2027  Diet -- overall healhty Exercise -- very active regularly  Sleep habits -- see above Mood -- mood overall stable  UTD with dentist? - yes UTD with eye doctor? - yes  Weight history: Wt Readings from Last 10 Encounters:  07/25/22 159 lb 4 oz (72.2 kg)  07/23/22 153 lb 4.8 oz (69.5 kg)  07/10/22 154 lb (69.9 kg)  06/11/22 154 lb 12.8 oz (70.2 kg)  06/06/22 150 lb (68 kg)  05/16/22 153 lb 6.4 oz (69.6 kg)  05/14/22 151 lb 12.8 oz (68.9 kg)  05/12/22 160 lb (72.6 kg)  02/10/22 160 lb (72.6 kg)  12/18/21 160 lb 3.2 oz (72.7 kg)   Body mass index is 23.52 kg/m. No LMP for male patient.  Alcohol use:  reports no history of alcohol use.  Tobacco use:  Tobacco Use: Medium Risk (07/25/2022)   Patient History    Smoking Tobacco Use: Former    Smokeless Tobacco Use: Never    Passive Exposure: Not on file   Eligible for lung cancer screening? no     05/12/2022    1:58 PM  Depression screen PHQ 2/9  Decreased Interest 0  Down, Depressed, Hopeless 0  PHQ - 2 Score 0     Other providers/specialists: Patient Care Team: Inda Coke, [place] as PCP - General (Physician Assistant) Minus Breeding, MD as PCP - Cardiology (Cardiology) Raynelle Bring, MD as Consulting  Physician (Urology)    PMHx, SurgHx, SocialHx, Medications, and Allergies were reviewed in the Visit Navigator and updated as appropriate.   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 TAli Loweand Dr. ETenny Craw     Past Surgical History:  Procedure Laterality Date   ABDOMINAL AORTOGRAM N/A 06/06/2022   Procedure: ABDOMINAL AORTOGRAM;  Surgeon: TEarly Osmond MD;  [facility];  Service: Cardiovascular;  Laterality: N/A;   BALLOON AORTIC VALVE VALVULOPLASTY  07/22/2022   Procedure: BALLOON AORTIC VALVE VALVULOPLASTY;  Surgeon: TEarly Osmond MD;  [place] [facility]  Service: Open Heart Surgery;;   HERNIA REPAIR     INTRAOPERATIVE TRANSTHORACIC ECHOCARDIOGRAM N/A 07/22/2022   Procedure: INTRAOPERATIVE TRANSTHORACIC ECHOCARDIOGRAM;  Surgeon: TEarly Osmond MD;  [place] [facility]  Service: Open Heart Surgery;  Laterality: N/A;   PROSTATE SURGERY  2012   RIGHT/LEFT HEART CATH AND CORONARY ANGIOGRAPHY  N/A 06/06/2022   Procedure: RIGHT/LEFT HEART CATH AND CORONARY ANGIOGRAPHY;  Surgeon: Early Osmond, MD;  Location: [facility];  Service: Cardiovascular;  Laterality: N/A;   TRANSCATHETER AORTIC VALVE REPLACEMENT, TRANSFEMORAL N/A 07/22/2022   Procedure: Transcatheter Aortic Valve Replacement, Transfemoral;  Surgeon: Early Osmond, MD;  Location: [facility];  Service: Open Heart Surgery;  Laterality: N/A;   ULTRASOUND GUIDANCE FOR VASCULAR ACCESS N/A 07/22/2022   Procedure: ULTRASOUND GUIDANCE FOR VASCULAR ACCESS;  Surgeon: Early Osmond, MD;  Location: [facility];  Service: Open Heart Surgery;  Laterality: N/A;     Family History  Problem Relation Age of Onset   Heart disease Mother        CHF   Hypertension Mother    Prostate cancer Father    Valvular heart disease Brother    Heart disease Maternal Grandmother    Stroke Maternal Grandmother     Social History   Tobacco Use   Smoking status: Former    Types: Pipe    Quit date: 03/20/1981    Years since quitting: 41.3   Smokeless tobacco: Never   Tobacco comments:    Quit in 1983 pipe smoker   Substance Use Topics   Alcohol use: No   Drug use: No    Review of Systems:   Review of Systems  Constitutional:  Negative for chills, fever, malaise/fatigue and weight loss.  HENT:  Negative for hearing loss, sinus pain and sore throat.   Respiratory:  Negative for cough and hemoptysis.   Cardiovascular:  Negative for chest pain, palpitations, leg swelling and PND.  Gastrointestinal:  Negative for abdominal pain, constipation, diarrhea, heartburn, nausea and vomiting.  Genitourinary:  Negative for dysuria, frequency and urgency.  Musculoskeletal:  Negative for back pain, myalgias and neck pain.  Skin:  Negative for itching and rash.  Neurological:  Negative for dizziness, tingling, seizures and headaches.  Endo/Heme/Allergies:  Negative for polydipsia.  Psychiatric/Behavioral:  Negative for depression. The patient is not nervous/anxious.     Objective:   BP 122/70 (BP Location: Left Arm, Patient Position: Sitting, Cuff Size: Normal)   Pulse (!) 59   Temp 98.2 F (36.8 C) (Temporal)   Ht '5\' 9"'$  (1.753 m)   Wt 159 lb 4 oz (72.2 kg)   SpO2 97%   BMI 23.52 kg/m  Body mass index is 23.52 kg/m.   General Appearance:    Alert, cooperative, no distress, appears stated age  Head:    Normocephalic, without obvious abnormality, atraumatic  Eyes:    PERRL, conjunctiva/corneas clear, EOM's intact, fundi    benign, both eyes  Ears:    Normal TM's and external  ear canals, both ears R ear with mild cerumen  Nose:   Nares normal, septum midline, mucosa normal, no drainage    or sinus tenderness  Throat:   Lips, mucosa, and tongue normal; teeth and gums normal  Neck:   Supple, symmetrical, trachea midline, no adenopathy;    thyroid:  no enlargement/tenderness/nodules; no carotid   bruit or JVD  Back:     Symmetric, no curvature, ROM normal, no CVA tenderness  Lungs:     Clear to auscultation bilaterally, respirations unlabored  Chest Wall:    No tenderness or deformity   Heart:    Regular rate and rhythm, S1 and S2 normal, no murmur, rub or gallop     Abdomen:     Soft, non-tender, bowel sounds active all four quadrants,    no masses,  no organomegaly  Genitalia:    Deferred  Extremities:   Extremities normal, atraumatic, no cyanosis or edema  Pulses:   2+ and symmetric all extremities  Skin:   Skin color, texture, turgor normal, no rashes or lesions  Lymph nodes:   Cervical, supraclavicular, and axillary nodes normal  Neurologic:   CNII-XII intact, normal strength, sensation and reflexes    throughout   Ceruminosis is noted.  Wax is removed by syringing and manual debridement.   Assessment/Plan:   Routine physical examination Today patient counseled on age appropriate routine health concerns for screening and prevention, each reviewed and up to date or declined. Immunizations reviewed and up to date or declined. Labs ordered and reviewed. Risk factors for depression reviewed and negative. Hearing function and visual acuity are intact. ADLs screened and addressed as needed. Functional ability and level of safety reviewed and appropriate. Education, counseling and referrals performed based on assessed risks today. Patient provided with a copy of personalized plan for preventive services.  Pure hypercholesterolemia Update lipid panel and make recommendations accordingly  Hx of prostate cancer Update PSA  Impacted cerumen of right  ear Tolerated well Denies concerns  Need for immunization against influenza Completed today  S/P TAVR (transcatheter aortic valve replacement) Doing well, denies concerns   Inda Coke, PA-C [facility]

## 2022-07-24 NOTE — Telephone Encounter (Signed)
  [facility] VALVE TEAM   Patient contacted regarding discharge from [facility] on 07/23/22   Patient understands to follow up with provider Structural Heart APP  Patient understands discharge instructions? Yes  Patient understands medications and regimen? Yes  Patient understands to bring all medications to this visit? Yes   Kathyrn Drown NP-C Structural Heart Team  Pager: [phone]

## 2022-07-25 ENCOUNTER — Ambulatory Visit (INDEPENDENT_AMBULATORY_CARE_PROVIDER_SITE_OTHER): Payer: Medicare Other | Admitting: Physician Assistant

## 2022-07-25 ENCOUNTER — Encounter: Payer: Self-pay | Admitting: Physician Assistant

## 2022-07-25 VITALS — BP 122/70 | HR 59 | Temp 98.2°F | Ht 69.0 in | Wt 159.2 lb

## 2022-07-25 DIAGNOSIS — Z23 Encounter for immunization: Secondary | ICD-10-CM | POA: Diagnosis not present

## 2022-07-25 DIAGNOSIS — H6121 Impacted cerumen, right ear: Secondary | ICD-10-CM | POA: Diagnosis not present

## 2022-07-25 DIAGNOSIS — Z952 Presence of prosthetic heart valve: Secondary | ICD-10-CM

## 2022-07-25 DIAGNOSIS — E7800 Pure hypercholesterolemia, unspecified: Secondary | ICD-10-CM

## 2022-07-25 DIAGNOSIS — C61 Malignant neoplasm of prostate: Secondary | ICD-10-CM

## 2022-07-25 DIAGNOSIS — Z8546 Personal history of malignant neoplasm of prostate: Secondary | ICD-10-CM | POA: Diagnosis not present

## 2022-07-25 DIAGNOSIS — Z0000 Encounter for general adult medical examination without abnormal findings: Secondary | ICD-10-CM

## 2022-07-25 LAB — LIPID PANEL
Cholesterol: 164 mg/dL (ref 0–200)
HDL: 59.7 mg/dL (ref >39.00)
LDL Cholesterol: 86 mg/dL (ref 0–99)
NonHDL: 104.26
Total CHOL/HDL Ratio: 3
Triglycerides: 89 mg/dL (ref 0.0–149.0)
VLDL: 17.8 mg/dL (ref 0.0–40.0)

## 2022-07-25 LAB — PSA: PSA: 0 ng/mL — ABNORMAL LOW (ref 0.10–4.00)

## 2022-07-25 NOTE — Patient Instructions (Signed)
It was great to see you! ? ?Please go to the lab for blood work.  ? ?Our office will call you with your results unless you have chosen to receive results via MyChart. ? ?If your blood work is normal we will follow-up each year for physicals and as scheduled for chronic medical problems. ? ?If anything is abnormal we will treat accordingly and get you in for a follow-up. ? ?Take care, ? ?Vickii Volland ?  ? ? ?

## 2022-07-31 MED FILL — Heparin Sodium (Porcine) Inj 1000 Unit/ML: Qty: 1000 | Status: AC

## 2022-07-31 MED FILL — Magnesium Sulfate Inj 50%: INTRAMUSCULAR | Qty: 10 | Status: AC

## 2022-07-31 MED FILL — Norepinephrine-Dextrose IV Solution 4 MG/250ML-5%: INTRAVENOUS | Qty: 250 | Status: AC

## 2022-07-31 MED FILL — Potassium Chloride Inj 2 mEq/ML: INTRAVENOUS | Qty: 40 | Status: AC

## 2022-08-04 NOTE — Progress Notes (Unsigned)
[facility] [place]                                     Cardiology Office Note:    Date:  08/06/2022   ID:  Harold Leach, DOB [birth year], MRN [id number]  PCP:  Inda Coke, [facility] Cardiologist:  Minus Breeding, MD / Dr. Ali Lowe & Dr. Tenny Craw (TAVR)  [place] Electrophysiologist:  None   Referring MD: Inda Coke, PA   [facility] s/p TAVR  History of Present Illness:    Harold Leach is a 81 y.o. 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   He was recently seen by Dr. Percival Spanish and reported increasing fatigue, occasional chest discomfort, and dyspnea. Echo 01/28/22 showed EF 60% and severe AS with a mean grad 34.2 mmHg, peak grad 62.5 mmHg, AVA 0.76 cm2, DVI 0.17 as well as mild to moderate MR. [facility] 06/06/22 showed normal cors.   He was evaluated by the multidisciplinary valve team and underwent  successful TAVR with a 29 mm Edwards Sapien 3 Ultra Resilia THV via the TF approach on 07/22/22. Post operative echo showed EF 60%, normally functioning TAVR with a mean gradient of 6 mmHg and trivial PVL. LVEDP elevated ~24 at the time of TAVR and treated with one dose of IV lasix. Continued on Asprin '81mg'$  daily.   Today the patient presents to clinic for follow up.  Here with wife. Doing well with an improvement in exercise tolerance and breathing. No CP or SOB. No LE edema, orthopnea or PND. No dizziness or syncope. No blood in stool or urine. No palpitations.    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 TAli Loweand Dr. ETenny Craw    Past Surgical History:  Procedure Laterality Date   ABDOMINAL AORTOGRAM N/A 06/06/2022   Procedure: ABDOMINAL AORTOGRAM;  Surgeon: TEarly Osmond MD;  [facility];  Service: Cardiovascular;  Laterality: N/A;   BALLOON AORTIC VALVE VALVULOPLASTY  07/22/2022   Procedure: BALLOON AORTIC VALVE VALVULOPLASTY;  Surgeon: TEarly Osmond MD;  [facility] [facility]  Service: Open Heart Surgery;;   HERNIA REPAIR     INTRAOPERATIVE TRANSTHORACIC ECHOCARDIOGRAM N/A 07/22/2022   Procedure: INTRAOPERATIVE TRANSTHORACIC ECHOCARDIOGRAM;  Surgeon: TEarly Osmond MD;  Location[facility]  Service: Open Heart Surgery;  Laterality: N/A;   PROSTATE SURGERY  2012   RIGHT/LEFT HEART CATH AND CORONARY ANGIOGRAPHY N/A 06/06/2022   Procedure: RIGHT/LEFT HEART CATH AND CORONARY ANGIOGRAPHY;  Surgeon: TEarly Osmond MD;  [facility];  Service: Cardiovascular;  Laterality: N/A;   TRANSCATHETER AORTIC VALVE REPLACEMENT, TRANSFEMORAL N/A 07/22/2022   Procedure: Transcatheter Aortic Valve Replacement, Transfemoral;  Surgeon: TEarly Osmond MD;  [place] [facility]  Service: Open Heart Surgery;  Laterality: N/A;   ULTRASOUND GUIDANCE FOR VASCULAR ACCESS N/A 07/22/2022  Procedure: ULTRASOUND GUIDANCE FOR VASCULAR ACCESS;  Surgeon: Early Osmond, MD;  Location: [place];  Service: Open Heart Surgery;  Laterality: N/A;    Current Medications: Current Meds  Medication Sig   amoxicillin (AMOXIL) 500 MG tablet Take 4 tablets by mouth one hour prior to dental appointments.   aspirin EC 81 MG tablet Take 81 mg by mouth daily. Swallow whole.   Cholecalciferol (D3-1000 PO) Take 1,000 Units by mouth every other day.   CINNAMON PO Take 1,200 mg by mouth daily.   clobetasol cream (TEMOVATE) 0.05 % APPLY TOPICALLY TO THE AFFECTED AREA TWICE  DAILY (Patient taking differently: Apply 1 Application topically 2 (two) times daily as needed (irritation).)   cyanocobalamin (VITAMIN B12) 1000 MCG tablet Take 1,000 mcg by mouth daily.   Ginkgo Biloba 60 MG CAPS Take 60 mg by mouth daily.   Melatonin 10 MG TABS Take 10 mg by mouth at bedtime.   mirtazapine (REMERON SOL-TAB) 30 MG disintegrating tablet Take 30 mg by mouth at bedtime. Pt. Take 1 table at night.   Multiple Vitamins-Minerals (PRESERVISION AREDS 2+MULTI VIT PO) Take 2 capsules by mouth daily.   OVER THE COUNTER MEDICATION Take 4 tablets by mouth daily. Acuitol (Advanced Mental Acuity Support)   pantoprazole (PROTONIX) 40 MG tablet TAKE 1 TABLET(40 MG) BY MOUTH DAILY   Polyethyl Glycol-Propyl Glycol (SYSTANE OP) Place 1 drop into both eyes daily as needed (dry eye).   traZODone (DESYREL) 150 MG tablet Take 50-100 mg by mouth See admin instructions. Take 50 mg at bedtime, may increase to 100 mg as needed for severe insomnia   Turmeric (QC TUMERIC COMPLEX PO) Take 1,000 mg by mouth daily.     Allergies:   Patient has no known allergies.   Social History   Socioeconomic History   Marital status: Married    Spouse name: Not on file   Number of children: Not on file   Years of education: Not on file   Highest education level: Not on file  Occupational History   Occupation: Retired  Tobacco Use   Smoking status: Former    Types: Pipe    Quit date: 03/20/1981    Years since quitting: 41.4   Smokeless tobacco: Never   Tobacco comments:    Quit in 1983 pipe smoker   Substance and Sexual Activity   Alcohol use: No   Drug use: No   Sexual activity: Yes    Partners: Female  Other Topics Concern   Not on file  Social History Narrative   Not on file   Social Determinants of Health   Financial Resource Strain: Low Risk  (05/12/2022)   Overall Financial Resource Strain (CARDIA)    Difficulty of Paying Living Expenses: Not hard at all  Food Insecurity: No Food Insecurity  (05/12/2022)   Hunger Vital Sign    Worried About Running Out of Food in the Last Year: Never true    [place] in the Last Year: Never true  Transportation Needs: No Transportation Needs (05/12/2022)   PRAPARE - Hydrologist (Medical): No    Lack of Transportation (Non-Medical): No  Physical Activity: Insufficiently Active (05/12/2022)   Exercise Vital Sign    Days of Exercise per Week: 7 days    Minutes of Exercise per Session: 20 min  Stress: No Stress Concern Present (05/12/2022)   [place]    Feeling of Stress : Not  at all  Social Connections: Moderately Integrated (05/12/2022)   Social Connection and Isolation Panel [NHANES]    Frequency of Communication with Friends and Family: Once a week    Frequency of Social Gatherings with Friends and Family: More than three times a week    Attends Religious Services: Never    Marine scientist or Organizations: Yes    Attends Music therapist: 1 to 4 times per year    Marital Status: Married     Family History: The patient's family history includes Heart disease in his maternal grandmother and mother; Hypertension in his mother; Prostate cancer in his father; Stroke in his maternal grandmother; Valvular heart disease in his brother.  ROS:   Please see the history of present illness.    All other systems reviewed and are negative.  EKGs/Labs/Other Studies Reviewed:    The following studies were reviewed today:  TAVR OPERATIVE NOTE     Date of Procedure:                07/22/2022   Preoperative Diagnosis:      Severe Aortic Stenosis    Postoperative Diagnosis:    Same    Procedure:        Transcatheter Aortic Valve Replacement - Transfemoral Approach             Edwards Sapien 3 Resilia THV (size 29 mm, model # 9755RLS, serial # [id number])              Co-Surgeons:                         Justice Rocher, MD and  Lenna Sciara, MD Anesthesiologist:                  Rudean Haskell, MD   Echocardiographer:              Rudean Haskell, MD   Pre-operative Echo Findings: Severe aortic stenosis Normal left ventricular systolic function   Post-operative Echo Findings: Trace paravalvular leak Normal left ventricular systolic function   Left Heart Catheterization Findings: Left ventricular end-diastolic pressure of 24 mmHg   _____________   Echo 07/23/22:  IMPRESSIONS   1. Left ventricular ejection fraction, by estimation, is 60%. Left  ventricular ejection fraction by 2D MOD biplane is 60.0 %. The left  ventricle has normal function. The left ventricle has no regional wall  motion abnormalities. There is mild asymmetric  left ventricular hypertrophy of the septal segment. Left ventricular  diastolic parameters are consistent with Grade I diastolic dysfunction  (impaired relaxation).   2. Right ventricular systolic function is normal. The right ventricular  size is normal. There is normal pulmonary artery systolic pressure. The  estimated right ventricular systolic pressure is 54.0 mmHg.   3. Left atrial size was mildly dilated.   4. The mitral valve is normal in structure. Trivial mitral valve  regurgitation. No evidence of mitral stenosis.   5. The aortic valve has been repaired/replaced. Aortic valve  regurgitation is trivial. There is a 29 mm Sapien prosthetic (TAVR) valve  present in the aortic position. Procedure Date: 07/22/2022. Echo findings  are consistent with normal structure and  function of the aortic valve prosthesis. Aortic valve area, by VTI  measures 4.81 cm. Aortic valve mean gradient measures 6.0 mmHg. Peak  gradient 9 mm Hg. DVI 0.7. EOA 4.8 cm2. Trivial PVL at the 2 o'clock PSAX,  also seen in PLAX.  6. Aortic dilatation noted. 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   Comparison(s): No change in PVL. Similar gradients.     EKG:  EKG is ordered today.  The ekg ordered today demonstrates sinus brady with a HR 51, PR slightly prolonged  Recent Labs: 07/18/2022: ALT 32 07/23/2022: BUN 23; Creatinine, Ser 1.46; Hemoglobin 11.9; Magnesium 1.9; Platelets 118; Potassium 4.0; Sodium 139  Recent Lipid Panel    Component Value Date/Time   CHOL 164 07/25/2022 0943   TRIG 89.0 07/25/2022 0943   HDL 59.70 07/25/2022 0943   CHOLHDL 3 07/25/2022 0943   VLDL 17.8 07/25/2022 0943   LDLCALC 86 07/25/2022 0943   LDLCALC 98 07/23/2020 0936     Risk Assessment/Calculations:       Physical Exam:    VS:  BP 120/64   Pulse (!) 51   Ht '5\' 9"'$  (1.753 m)   Wt 162 lb 3.2 oz (73.6 kg)   SpO2 96%   BMI 23.95 kg/m     Wt Readings from Last 3 Encounters:  08/06/22 162 lb 3.2 oz (73.6 kg)  07/25/22 159 lb 4 oz (72.2 kg)  07/23/22 153 lb 4.8 oz (69.5 kg)     GEN:  Well nourished, well developed in no acute distress HEENT: Normal NECK: No JVD LYMPHATICS: No lymphadenopathy CARDIAC: RRR, no murmurs, rubs, gallops RESPIRATORY:  Clear to auscultation without rales, wheezing or rhonchi  ABDOMEN: Soft, non-tender, non-distended MUSCULOSKELETAL:  No edema; No deformity  SKIN: Warm and dry.  Groin sites clear without hematoma or ecchymosis. Right groin with 1 cm open wound but no sign of infection.  NEUROLOGIC:  Alert and oriented x 3 PSYCHIATRIC:  Normal affect   ASSESSMENT:    1. S/P TAVR (transcatheter aortic valve replacement)   2. Chronic diastolic CHF (congestive heart failure) (HCC)   3. Lymphadenopathy   4. Pulmonary nodules    PLAN:    In order of problems listed above:  Severe AS s/p TAVR: doing well two weeks out from TAVR. ECG with no HAVB. Groin sites healing well. SBE prophylaxis discussed; I have RX'd amoxicillin. I will see him back for 1 month follow up and echo.    Chronic diastolic CHF: appears euvolemic off diuretics   Lymphadenopathy: pre TAVR CTs reported "enlarged  right-greater-than-left hilar lymph nodes, possibly reactive. Recommend follow-up chest CT in 3 months to ensure stability or resolution." This was discussed today and will be set up at the 1 month visit. Due end of December.   Pulmonary nodules: pre TAVR CT reported "multiple small solid pulmonary nodules, largest measures 5 mm. No follow-up needed if patient is low-risk (and has no known or suspected primary neoplasm). Non-contrast chest CT can be considered in 12 months if patient is high-risk." Will follow on CT ordered for above and follow recs.    Medication Adjustments/Labs and Tests Ordered: Current medicines are reviewed at length with the patient today.  Concerns regarding medicines are outlined above.  No orders of the defined types were placed in this encounter.  Meds ordered this encounter  Medications   amoxicillin (AMOXIL) 500 MG tablet    Sig: Take 4 tablets by mouth one hour prior to dental appointments.    Dispense:  4 tablet    Refill:  3    Patient Instructions  Medication Instructions:  Your physician recommends that you continue on your current medications as directed. Please refer to the Current Medication list given to you today.  *  If you need a refill on your cardiac medications before your next appointment, please call your pharmacy*   Lab Work: none If you have labs (blood work) drawn today and your tests are completely normal, you will receive your results only by: [place] (if you have MyChart) OR A paper copy in the mail If you have any lab test that is abnormal or we need to change your treatment, we will call you to review the results.   Testing/Procedures: Your physician has requested that you have an echocardiogram. Echocardiography is a painless test that uses sound waves to create images of your heart. It provides your doctor with information about the size and shape of your heart and how well your heart's chambers and valves are working.  This procedure takes approximately one hour. There are no restrictions for this procedure. Please do NOT wear cologne, perfume, aftershave, or lotions (deodorant is allowed). Please arrive 15 minutes prior to your appointment time. Scheduled for 08/22/22   Follow-Up: At [facility], you and your health needs are our priority.  As part of our continuing mission to provide you with exceptional heart care, we have created designated Provider Care Teams.  These Care Teams include your primary Cardiologist (physician) and Advanced Practice Providers (APPs -  Physician Assistants and Nurse Practitioners) who all work together to provide you with the care you need, when you need it.  We recommend signing up for the patient portal called "MyChart".  Sign up information is provided on this After Visit Summary.  MyChart is used to connect with patients for Virtual Visits (Telemedicine).  Patients are able to view lab/test results, encounter notes, upcoming appointments, etc.  Non-urgent messages can be sent to your provider as well.   To learn more about what you can do with MyChart, go to [email or web address].    Your next appointment:   08/22/22  The format for your next appointment:   In Person  Provider:   Structural heart team .   Other Instruction Your physician discussed the importance of taking an antibiotic prior to any dental, gastrointestinal, genitourinary procedures to prevent damage to the heart valves from infection. You were given a prescription for an antibiotic based on current SBE prophylaxis guidelines.  A prescription for Amoxicillin has been sent to your pharmacy   Important Information About Sugar         Signed, Angelena Form, PA-C  08/06/2022 2:19 PM    [place]

## 2022-08-06 ENCOUNTER — Ambulatory Visit: Payer: Medicare Other | Attending: Physician Assistant | Admitting: Physician Assistant

## 2022-08-06 VITALS — BP 120/64 | HR 51 | Ht 69.0 in | Wt 162.2 lb

## 2022-08-06 DIAGNOSIS — R918 Other nonspecific abnormal finding of lung field: Secondary | ICD-10-CM | POA: Insufficient documentation

## 2022-08-06 DIAGNOSIS — I5032 Chronic diastolic (congestive) heart failure: Secondary | ICD-10-CM | POA: Diagnosis not present

## 2022-08-06 DIAGNOSIS — Z952 Presence of prosthetic heart valve: Secondary | ICD-10-CM | POA: Insufficient documentation

## 2022-08-06 DIAGNOSIS — R591 Generalized enlarged lymph nodes: Secondary | ICD-10-CM | POA: Insufficient documentation

## 2022-08-06 MED ORDER — AMOXICILLIN 500 MG PO TABS: ORAL_TABLET | ORAL | 3 refills | Status: AC

## 2022-08-06 NOTE — Patient Instructions (Signed)
Medication Instructions:  Your physician recommends that you continue on your current medications as directed. Please refer to the Current Medication list given to you today.  *If you need a refill on your cardiac medications before your next appointment, please call your pharmacy*   Lab Work: none If you have labs (blood work) drawn today and your tests are completely normal, you will receive your results only by: [place] (if you have MyChart) OR A paper copy in the mail If you have any lab test that is abnormal or we need to change your treatment, we will call you to review the results.   Testing/Procedures: Your physician has requested that you have an echocardiogram. Echocardiography is a painless test that uses sound waves to create images of your heart. It provides your doctor with information about the size and shape of your heart and how well your heart's chambers and valves are working. This procedure takes approximately one hour. There are no restrictions for this procedure. Please do NOT wear cologne, perfume, aftershave, or lotions (deodorant is allowed). Please arrive 15 minutes prior to your appointment time. Scheduled for 08/22/22   Follow-Up: At [facility], you and your health needs are our priority.  As part of our continuing mission to provide you with exceptional heart care, we have created designated Provider Care Teams.  These Care Teams include your primary Cardiologist (physician) and Advanced Practice Providers (APPs -  Physician Assistants and Nurse Practitioners) who all work together to provide you with the care you need, when you need it.  We recommend signing up for the patient portal called "MyChart".  Sign up information is provided on this After Visit Summary.  MyChart is used to connect with patients for Virtual Visits (Telemedicine).  Patients are able to view lab/test results, encounter notes, upcoming appointments, etc.  Non-urgent  messages can be sent to your provider as well.   To learn more about what you can do with MyChart, go to [email or web address].    Your next appointment:   08/22/22  The format for your next appointment:   In Person  Provider:   Structural heart team .   Other Instruction Your physician discussed the importance of taking an antibiotic prior to any dental, gastrointestinal, genitourinary procedures to prevent damage to the heart valves from infection. You were given a prescription for an antibiotic based on current SBE prophylaxis guidelines.  A prescription for Amoxicillin has been sent to your pharmacy   Important Information About [place]

## 2022-08-07 NOTE — Addendum Note (Signed)
Addended byDanielle Dess on: 08/07/2022 09:59 AM   Modules accepted: Orders

## 2022-08-11 NOTE — Addendum Note (Signed)
Addended byDanielle Dess on: 08/11/2022 07:55 AM   Modules accepted: Orders

## 2022-08-21 NOTE — Progress Notes (Addendum)
[facility] [place]                                     Cardiology Office Note:    Date:  08/22/2022   ID:  Harold Leach, DOB 21-Dec-1940, MRN [id number]  PCP:  Inda Coke, [facility] Cardiologist:  Minus Breeding, MD / Dr. Ali Lowe & Dr. Tenny Craw (TAVR)  [place] Electrophysiologist:  None   Referring MD: Inda Coke, PA   1 month s/p TAVR  History of Present Illness:    Harold Leach is a 81 y.o. 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   He was recently seen by Dr. Percival Spanish and reported increasing fatigue, occasional chest discomfort, and dyspnea. Echo 01/28/22 showed EF 60% and severe AS with a mean grad 34.2 mmHg, peak grad 62.5 mmHg, AVA 0.76 cm2, DVI 0.17 as well as mild to moderate MR. [facility] 06/06/22 showed normal cors.   He was evaluated by the multidisciplinary valve team and underwent  successful TAVR with a 29 mm Edwards Sapien 3 Ultra Resilia THV via the TF approach on 07/22/22. Post operative echo showed EF 60%, normally functioning TAVR with a mean gradient of 6 mmHg and trivial PVL. LVEDP elevated ~24 at the time of TAVR and treated with one dose of IV lasix. Continued on Asprin '81mg'$  daily.   Today the patient presents to clinic for follow up.  Here with his wife. Doing great. Can now walk so much further without stopping. No CP or SOB. No LE edema, orthopnea or PND. No dizziness or syncope. No blood in stool or urine. No palpitations.     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 TAli Loweand Dr. ETenny Craw    Past Surgical History:  Procedure Laterality Date   ABDOMINAL AORTOGRAM N/A 06/06/2022   Procedure: ABDOMINAL AORTOGRAM;  Surgeon: TEarly Osmond MD;  [facility];  Service: Cardiovascular;  Laterality: N/A;   BALLOON AORTIC VALVE VALVULOPLASTY  07/22/2022   Procedure: BALLOON AORTIC VALVE VALVULOPLASTY;  Surgeon: TEarly Osmond MD;  [facility]  Service: Open Heart Surgery;;   HERNIA REPAIR     INTRAOPERATIVE TRANSTHORACIC ECHOCARDIOGRAM N/A 07/22/2022   Procedure: INTRAOPERATIVE TRANSTHORACIC ECHOCARDIOGRAM;  Surgeon: TEarly Osmond MD;  [place]  Service: Open Heart Surgery;  Laterality: N/A;   PROSTATE SURGERY  2012   RIGHT/LEFT HEART CATH AND CORONARY ANGIOGRAPHY N/A 06/06/2022   Procedure: RIGHT/LEFT HEART CATH AND CORONARY ANGIOGRAPHY;  Surgeon: TEarly Osmond MD;  [facility];  Service: Cardiovascular;  Laterality: N/A;   TRANSCATHETER AORTIC VALVE REPLACEMENT, TRANSFEMORAL N/A 07/22/2022   Procedure: Transcatheter Aortic Valve Replacement, Transfemoral;  Surgeon: TEarly Osmond MD;  [facility]  Service: Open Heart Surgery;  Laterality: N/A;   ULTRASOUND GUIDANCE FOR VASCULAR ACCESS N/A  07/22/2022   Procedure: ULTRASOUND GUIDANCE FOR VASCULAR ACCESS;  Surgeon: Early Osmond, MD;  Location: [place];  Service: Open Heart Surgery;  Laterality: N/A;    Current Medications: Current Meds  Medication Sig   amoxicillin (AMOXIL) 500 MG tablet Take 4 tablets by mouth one hour prior to dental appointments.   aspirin EC 81 MG tablet Take 81 mg by mouth daily. Swallow whole.   Cholecalciferol (D3-1000 PO) Take 1,000 Units by mouth every other day.   CINNAMON PO Take 1,200 mg by mouth daily.   clobetasol cream (TEMOVATE) 0.05 % APPLY TOPICALLY TO THE AFFECTED AREA TWICE  DAILY (Patient taking differently: Apply 1 Application topically 2 (two) times daily as needed (irritation).)   cyanocobalamin (VITAMIN B12) 1000 MCG tablet Take 1,000 mcg by mouth daily.   Ginkgo Biloba 60 MG CAPS Take 60 mg by mouth daily.   Melatonin 10 MG TABS Take 10 mg by mouth at bedtime.   mirtazapine (REMERON SOL-TAB) 30 MG disintegrating tablet Take 30 mg by mouth at bedtime. Pt. Take 1 table at night.   Multiple Vitamins-Minerals (PRESERVISION AREDS 2+MULTI VIT PO) Take 2 capsules by mouth daily.   OVER THE COUNTER MEDICATION Take 4 tablets by mouth daily. Acuitol (Advanced Mental Acuity Support)   pantoprazole (PROTONIX) 40 MG tablet TAKE 1 TABLET(40 MG) BY MOUTH DAILY   Polyethyl Glycol-Propyl Glycol (SYSTANE OP) Place 1 drop into both eyes daily as needed (dry eye).   traZODone (DESYREL) 150 MG tablet Take 50-100 mg by mouth See admin instructions. Take 50 mg at bedtime, may increase to 100 mg as needed for severe insomnia   Turmeric (QC TUMERIC COMPLEX PO) Take 1,000 mg by mouth daily.     Allergies:   Patient has no known allergies.   Social History   Socioeconomic History   Marital status: Married    Spouse name: Not on file   Number of children: Not on file   Years of education: Not on file   Highest education level: Not on file  Occupational History   Occupation: Retired  Tobacco Use   Smoking status: Former    Types: Pipe    Quit date: 03/20/1981    Years since quitting: 41.4   Smokeless tobacco: Never   Tobacco comments:    Quit in 1983 pipe smoker   Substance and Sexual Activity   Alcohol use: No   Drug use: No   Sexual activity: Yes    Partners: Female  Other Topics Concern   Not on file  Social History Narrative   Not on file   Social Determinants of Health   Financial Resource Strain: Low Risk  (05/12/2022)   Overall Financial Resource Strain (CARDIA)    Difficulty of Paying Living Expenses: Not hard at all  Food Insecurity: No Food Insecurity  (05/12/2022)   Hunger Vital Sign    Worried About Running Out of Food in the Last Year: Never true    [place] in the Last Year: Never true  Transportation Needs: No Transportation Needs (05/12/2022)   PRAPARE - Hydrologist (Medical): No    Lack of Transportation (Non-Medical): No  Physical Activity: Insufficiently Active (05/12/2022)   Exercise Vital Sign    Days of Exercise per Week: 7 days    Minutes of Exercise per Session: 20 min  Stress: No Stress Concern Present (05/12/2022)   [place]    Feeling of  Stress : Not at all  Social Connections: Moderately Integrated (05/12/2022)   Social Connection and Isolation Panel [NHANES]    Frequency of Communication with Friends and Family: Once a week    Frequency of Social Gatherings with Friends and Family: More than three times a week    Attends Religious Services: Never    Marine scientist or Organizations: Yes    Attends Music therapist: 1 to 4 times per year    Marital Status: Married     Family History: The patient's family history includes Heart disease in his maternal grandmother and mother; Hypertension in his mother; Prostate cancer in his father; Stroke in his maternal grandmother; Valvular heart disease in his brother.  ROS:   Please see the history of present illness.    All other systems reviewed and are negative.  EKGs/Labs/Other Studies Reviewed:    The following studies were reviewed today:  TAVR OPERATIVE NOTE     Date of Procedure:                07/22/2022   Preoperative Diagnosis:      Severe Aortic Stenosis    Postoperative Diagnosis:    Same    Procedure:        Transcatheter Aortic Valve Replacement - Transfemoral Approach             Edwards Sapien 3 Resilia THV (size 29 mm, model # 9755RLS, serial # [id number])              Co-Surgeons:                         Justice Rocher, MD and  Lenna Sciara, MD Anesthesiologist:                  Rudean Haskell, MD   Echocardiographer:              Rudean Haskell, MD   Pre-operative Echo Findings: Severe aortic stenosis Normal left ventricular systolic function   Post-operative Echo Findings: Trace paravalvular leak Normal left ventricular systolic function   Left Heart Catheterization Findings: Left ventricular end-diastolic pressure of 24 mmHg   _____________   Echo 07/23/22:  IMPRESSIONS   1. Left ventricular ejection fraction, by estimation, is 60%. Left  ventricular ejection fraction by 2D MOD biplane is 60.0 %. The left  ventricle has normal function. The left ventricle has no regional wall  motion abnormalities. There is mild asymmetric  left ventricular hypertrophy of the septal segment. Left ventricular  diastolic parameters are consistent with Grade I diastolic dysfunction  (impaired relaxation).   2. Right ventricular systolic function is normal. The right ventricular  size is normal. There is normal pulmonary artery systolic pressure. The  estimated right ventricular systolic pressure is 08.6 mmHg.   3. Left atrial size was mildly dilated.   4. The mitral valve is normal in structure. Trivial mitral valve  regurgitation. No evidence of mitral stenosis.   5. The aortic valve has been repaired/replaced. Aortic valve  regurgitation is trivial. There is a 29 mm Sapien prosthetic (TAVR) valve  present in the aortic position. Procedure Date: 07/22/2022. Echo findings  are consistent with normal structure and  function of the aortic valve prosthesis. Aortic valve area, by VTI  measures 4.81 cm. Aortic valve mean gradient measures 6.0 mmHg. Peak  gradient 9 mm Hg. DVI 0.7. EOA 4.8 cm2. Trivial PVL at the 2 o'clock PSAX,  also seen  in [place].   6. Aortic dilatation noted. 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   Comparison(s): No change in PVL. Similar gradients.    ___________________  Echo 08/22/22 IMPRESSIONS  1. Left ventricular ejection fraction, by estimation, is 60 to 65%. The left ventricle has normal function. The left ventricle has no regional wall motion abnormalities. Left ventricular diastolic parameters are consistent with Grade I diastolic dysfunction (impaired relaxation).  2. Right ventricular systolic function is normal. The right ventricular size is moderately enlarged.  3. Left atrial size was moderately dilated.  4. Right atrial size was moderately dilated.  5. The mitral valve is normal in structure. Mild mitral valve regurgitation. No evidence of mitral stenosis.  6. The aortic valve has been repaired/replaced. Aortic valve regurgitation is not visualized. No aortic stenosis is present. There is a 29 mm Sapien prosthetic (TAVR) valve present in the aortic position. Echo findings are consistent with normal structure and function of the aortic valve prosthesis. Aortic valve mean gradient measures 6.0 mmHg. Aortic valve Vmax measures 1.62 m/s.  7. Aortic dilatation noted. There is mild dilatation of the ascending aorta, measuring 41 mm.  8. The inferior vena cava is normal in size with greater than 50% respiratory variability, suggesting right atrial pressure of 3 mmHg.    EKG:  EKG is NOT ordered today.   Recent Labs: 07/18/2022: ALT 32 07/23/2022: BUN 23; Creatinine, Ser 1.46; Hemoglobin 11.9; Magnesium 1.9; Platelets 118; Potassium 4.0; Sodium 139  Recent Lipid Panel    Component Value Date/Time   CHOL 164 07/25/2022 0943   TRIG 89.0 07/25/2022 0943   HDL 59.70 07/25/2022 0943   CHOLHDL 3 07/25/2022 0943   VLDL 17.8 07/25/2022 0943   LDLCALC 86 07/25/2022 0943   LDLCALC 98 07/23/2020 0936     Risk Assessment/Calculations:       Physical Exam:    VS:  BP 136/72   Pulse (!) 50   Ht '5\' 9"'$  (1.753 m)   Wt 158 lb 9.6 oz (71.9 kg)   SpO2 98%   BMI 23.42 kg/m     Wt Readings from Last 3 Encounters:   08/22/22 158 lb 9.6 oz (71.9 kg)  08/06/22 162 lb 3.2 oz (73.6 kg)  07/25/22 159 lb 4 oz (72.2 kg)     GEN:  Well nourished, well developed in no acute distress HEENT: Normal NECK: No JVD LYMPHATICS: No lymphadenopathy CARDIAC: RRR, no murmurs, rubs, gallops RESPIRATORY:  Clear to auscultation without rales, wheezing or rhonchi  ABDOMEN: Soft, non-tender, non-distended MUSCULOSKELETAL:  No edema; No deformity  SKIN: Warm and dry.   NEUROLOGIC:  Alert and oriented x 3 PSYCHIATRIC:  Normal affect   ASSESSMENT:    1. S/P TAVR (transcatheter aortic valve replacement)   2. Chronic diastolic CHF (congestive heart failure) (HCC)   3. Lymphadenopathy   4. Pulmonary nodules     PLAN:    In order of problems listed above:  Severe AS s/p TAVR: echo today shows EF 60%, normally functioning TAVR with a mean gradient of 6 mm hg and no PVL as well as mild dilatation of the ascending aorta, measuring 41 mm. He has had a marked clinical improvement in symptoms and has NYHA class I symptoms. SBE prophylaxis discussed; he has amoxicillin. I will see him back for 1 year follow up and echo.    Chronic diastolic CHF: appears euvolemic off diuretics   Lymphadenopathy: pre TAVR CTs reported "enlarged right-greater-than-left hilar lymph nodes,  possibly reactive. Recommend follow-up chest CT in 3 months to ensure stability or resolution." Will get this set up today.   Pulmonary nodules: pre TAVR CT reported "multiple small solid pulmonary nodules, largest measures 5 mm. No follow-up needed if patient is low-risk (and has no known or suspected primary neoplasm). Non-contrast chest CT can be considered in 12 months if patient is high-risk." Will follow on CT ordered for above and follow recs.    Medication Adjustments/Labs and Tests Ordered: Current medicines are reviewed at length with the patient today.  Concerns regarding medicines are outlined above.  Orders Placed This Encounter  Procedures   CT  CHEST WO CONTRAST   ECHOCARDIOGRAM COMPLETE   No orders of the defined types were placed in this encounter.   Patient Instructions  Medication Instructions:  Your physician recommends that you continue on your current medications as directed. Please refer to the Current Medication list given to you today.   *If you need a refill on your cardiac medications before your next appointment, please call your pharmacy*   Lab Work: None ordered   If you have labs (blood work) drawn today and your tests are completely normal, you will receive your results only by: [place] (if you have MyChart) OR A paper copy in the mail If you have any lab test that is abnormal or we need to change your treatment, we will call you to review the results.   Testing/Procedures: Your physician has requested that you have an echocardiogram in October 2024. Echocardiography is a painless test that uses sound waves to create images of your heart. It provides your doctor with information about the size and shape of your heart and how well your heart's chambers and valves are working. This procedure takes approximately one hour. There are no restrictions for this procedure. Please do NOT wear cologne, perfume, aftershave, or lotions (deodorant is allowed). Please arrive 15 minutes prior to your appointment time.   Your Physician has ordered for you to have a Chest CT     Follow-Up: Follow up as scheduled   Other Instructions   Important Information About Sugar         Signed, Angelena Form, PA-C  08/22/2022 3:19 PM    [place]

## 2022-08-22 ENCOUNTER — Ambulatory Visit (HOSPITAL_COMMUNITY): Payer: Medicare Other | Attending: Physician Assistant | Admitting: Physician Assistant

## 2022-08-22 ENCOUNTER — Ambulatory Visit (HOSPITAL_BASED_OUTPATIENT_CLINIC_OR_DEPARTMENT_OTHER): Payer: Medicare Other

## 2022-08-22 VITALS — BP 136/72 | HR 50 | Ht 69.0 in | Wt 158.6 lb

## 2022-08-22 DIAGNOSIS — Z952 Presence of prosthetic heart valve: Secondary | ICD-10-CM | POA: Insufficient documentation

## 2022-08-22 DIAGNOSIS — R591 Generalized enlarged lymph nodes: Secondary | ICD-10-CM | POA: Diagnosis not present

## 2022-08-22 DIAGNOSIS — R918 Other nonspecific abnormal finding of lung field: Secondary | ICD-10-CM | POA: Diagnosis not present

## 2022-08-22 DIAGNOSIS — I5032 Chronic diastolic (congestive) heart failure: Secondary | ICD-10-CM | POA: Diagnosis not present

## 2022-08-22 LAB — ECHOCARDIOGRAM COMPLETE
AV Mean grad: 6 mmHg
AV Peak grad: 10.5 mmHg
Ao pk vel: 1.62 m/s
Area-P 1/2: 3.59 cm2
S' Lateral: 2.9 cm

## 2022-08-22 NOTE — Patient Instructions (Addendum)
Medication Instructions:  Your physician recommends that you continue on your current medications as directed. Please refer to the Current Medication list given to you today.   *If you need a refill on your cardiac medications before your next appointment, please call your pharmacy*   Lab Work: None ordered   If you have labs (blood work) drawn today and your tests are completely normal, you will receive your results only by: [place] (if you have MyChart) OR A paper copy in the mail If you have any lab test that is abnormal or we need to change your treatment, we will call you to review the results.   Testing/Procedures: Your physician has requested that you have an echocardiogram in October 2024. Echocardiography is a painless test that uses sound waves to create images of your heart. It provides your doctor with information about the size and shape of your heart and how well your heart's chambers and valves are working. This procedure takes approximately one hour. There are no restrictions for this procedure. Please do NOT wear cologne, perfume, aftershave, or lotions (deodorant is allowed). Please arrive 15 minutes prior to your appointment time.   Your Physician has ordered for you to have a Chest CT     Follow-Up: Follow up as scheduled   Other Instructions   Important Information About Sugar

## 2022-09-04 ENCOUNTER — Ambulatory Visit (HOSPITAL_COMMUNITY)
Admission: RE | Admit: 2022-09-04 | Discharge: 2022-09-04 | Disposition: A | Discharge status: Home / Self Care | Payer: Medicare Other | Source: Ambulatory Visit | Attending: Physician Assistant | Admitting: Physician Assistant

## 2022-09-04 DIAGNOSIS — R591 Generalized enlarged lymph nodes: Secondary | ICD-10-CM | POA: Insufficient documentation

## 2022-09-04 DIAGNOSIS — C61 Malignant neoplasm of prostate: Secondary | ICD-10-CM | POA: Diagnosis not present

## 2022-09-04 DIAGNOSIS — R918 Other nonspecific abnormal finding of lung field: Secondary | ICD-10-CM | POA: Diagnosis not present

## 2022-09-04 DIAGNOSIS — Z23 Encounter for immunization: Secondary | ICD-10-CM | POA: Diagnosis not present

## 2022-09-09 ENCOUNTER — Telehealth: Payer: Self-pay

## 2022-09-09 DIAGNOSIS — R911 Solitary pulmonary nodule: Secondary | ICD-10-CM

## 2022-09-10 NOTE — Telephone Encounter (Signed)
Reviewed CT results with patient and advised that he needed a follow up CT in 6-8 weeks. Patient verbalized understanding.

## 2022-10-02 DIAGNOSIS — R918 Other nonspecific abnormal finding of lung field: Secondary | ICD-10-CM | POA: Insufficient documentation

## 2022-10-02 DIAGNOSIS — R9389 Abnormal findings on diagnostic imaging of other specified body structures: Secondary | ICD-10-CM | POA: Insufficient documentation

## 2022-10-02 NOTE — Progress Notes (Signed)
Cardiology Office Note   Date:  10/03/2022   ID:  Harold Leach, Harold Leach November 29, 1940, MRN [id number]  PCP:  Inda Coke, PA  Cardiologist:   Minus Breeding, MD Referring:  Inda Coke, PA   Chief Complaint  Patient presents with   AVR      History of Present Illness: Harold Leach is a 82 y.o. male who is referred by Inda Coke, [place] for evaluation of chest pain.    In 2021 a POET (Plain Old Exercise Treadmill) was negative for ischemia.  Echo in May demonstrated a normal EF with AS with a mean gradient of 36 mm.  DI 0.17, AVA 0.75 cm2.   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  Post procedure echo demonstrated NL function in May.  Of note there is no mention of paravalvular leak that I can see but there is evidence of this.  This is mild.  He said since having the procedure he has been breathing much better.  He denies any palpitations, presyncope or syncope.  He had no new shortness of breath.  He is working on his model As.    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 TAli Loweand Dr. ETenny Craw    Past Surgical History:  Procedure Laterality Date   ABDOMINAL AORTOGRAM N/A 06/06/2022   Procedure: ABDOMINAL AORTOGRAM;  Surgeon: TEarly Osmond MD;  [facility];  Service: Cardiovascular;  Laterality: N/A;   BALLOON AORTIC VALVE VALVULOPLASTY  07/22/2022   Procedure: BALLOON AORTIC VALVE VALVULOPLASTY;  Surgeon: TEarly Osmond  MD;  [facility]  Service: Open Heart Surgery;;   HERNIA REPAIR     INTRAOPERATIVE TRANSTHORACIC ECHOCARDIOGRAM N/A 07/22/2022   Procedure: INTRAOPERATIVE TRANSTHORACIC ECHOCARDIOGRAM;  Surgeon: TEarly Osmond MD;  [facility]  Service: Open Heart Surgery;  Laterality: N/A;   PROSTATE SURGERY  2012   RIGHT/LEFT HEART CATH AND CORONARY ANGIOGRAPHY N/A 06/06/2022   Procedure: RIGHT/LEFT HEART CATH AND CORONARY ANGIOGRAPHY;  Surgeon: TEarly Osmond MD;  [facility];  Service: Cardiovascular;  Laterality: N/A;   TRANSCATHETER AORTIC VALVE REPLACEMENT, TRANSFEMORAL N/A 07/22/2022   Procedure: Transcatheter Aortic Valve Replacement, Transfemoral;  Surgeon: TEarly Osmond MD;  [facility]  Service: Open Heart Surgery;  Laterality: N/A;   ULTRASOUND GUIDANCE FOR VASCULAR ACCESS N/A 07/22/2022   Procedure: ULTRASOUND GUIDANCE FOR VASCULAR ACCESS;  Surgeon: TEarly Osmond MD;  [facility]  Service: Open Heart Surgery;  Laterality: N/A;     Current Outpatient Medications  Medication Sig Dispense Refill   amoxicillin (AMOXIL) 500 MG tablet Take 4 tablets by mouth one hour prior to dental appointments. 4 tablet 3   aspirin EC 81 MG tablet Take 81 mg by mouth daily. Swallow whole.     Cholecalciferol (D3-1000 PO) Take 1,000 Units by mouth every other day.     CINNAMON PO Take 1,200 mg by mouth daily.     clobetasol  cream (TEMOVATE) 0.05 % APPLY TOPICALLY TO THE AFFECTED AREA TWICE DAILY (Patient taking differently: Apply 1 Application topically 2 (two) times daily as needed (irritation).) 45 g 1   cyanocobalamin (VITAMIN B12) 1000 MCG tablet Take 1,000 mcg by mouth daily.     Ginkgo Biloba 60 MG CAPS Take 60 mg by mouth daily.     Melatonin 10 MG TABS Take 10 mg by mouth at bedtime.     mirtazapine (REMERON SOL-TAB) 30 MG disintegrating tablet Take 30 mg by mouth at bedtime. Pt. Take 1 table at night.     Multiple Vitamins-Minerals (PRESERVISION AREDS  2+MULTI VIT PO) Take 2 capsules by mouth daily.     OVER THE COUNTER MEDICATION Take 4 tablets by mouth daily. Acuitol (Advanced Mental Acuity Support)     pantoprazole (PROTONIX) 40 MG tablet TAKE 1 TABLET(40 MG) BY MOUTH DAILY 90 tablet 3   Polyethyl Glycol-Propyl Glycol (SYSTANE OP) Place 1 drop into both eyes daily as needed (dry eye).     traZODone (DESYREL) 150 MG tablet Take 50-100 mg by mouth See admin instructions. Take 50 mg at bedtime, may increase to 100 mg as needed for severe insomnia     Turmeric (QC TUMERIC COMPLEX PO) Take 1,000 mg by mouth daily.     No current facility-administered medications for this visit.    Allergies:   Patient has no known allergies.    ROS:  Please see the history of present illness.   Otherwise, review of systems are positive for none.   All other systems are reviewed and negative.    PHYSICAL EXAM: VS:  BP 136/78   Pulse 67   Ht 5' 9.5" (1.765 m)   Wt 162 lb 9.6 oz (73.8 kg)   SpO2 94%   BMI 23.67 kg/m  , BMI Body mass index is 23.67 kg/m. GENERAL:  Well appearing NECK:  No jugular venous distention, waveform within normal limits, carotid upstroke brisk and symmetric, no bruits, no thyromegaly LUNGS:  Clear to auscultation bilaterally CHEST:  Unremarkable HEART:  PMI not displaced or sustained,S1 and S2 within normal limits, no S3, no S4, no clicks, no rubs, 2 out of 6 brief apical systolic murmur only at the apex, 2 out of 6 diastolic murmur heard at the third left intercostal space murmurs ABD:  Flat, positive bowel sounds normal in frequency in pitch, no bruits, no rebound, no guarding, no midline pulsatile mass, no hepatomegaly, no splenomegaly EXT:  2 plus pulses throughout, no edema, no cyanosis no clubbing    EKG:  EKG is not ordered today.    Recent Labs: 07/18/2022: ALT 32 07/23/2022: BUN 23; Creatinine, Ser 1.46; Hemoglobin 11.9; Magnesium 1.9; Platelets 118; Potassium 4.0; Sodium 139    Lipid Panel    Component Value  Date/Time   CHOL 164 07/25/2022 0943   TRIG 89.0 07/25/2022 0943   HDL 59.70 07/25/2022 0943   CHOLHDL 3 07/25/2022 0943   VLDL 17.8 07/25/2022 0943   LDLCALC 86 07/25/2022 0943   LDLCALC 98 07/23/2020 0936      Wt Readings from Last 3 Encounters:  10/03/22 162 lb 9.6 oz (73.8 kg)  08/22/22 158 lb 9.6 oz (71.9 kg)  08/06/22 162 lb 3.2 oz (73.6 kg)      Other studies Reviewed: Additional studies/ records that were reviewed today include:  Echo Review of the above records demonstrates:  Please see elsewhere in the note.     ASSESSMENT AND PLAN:   AS:  He is  status post TAVR.  He has follow-up echo scheduled.  No change in therapy.  BRADYCARDIA:   Tolerates this and has had no symptoms.  No change in therapy.  DYSLIPIDEMIA: His LDL was 86.  HDL was 60.  He has no coronary artery disease.  No change in therapy.    ABNORMAL CT:   There was no evidence of lymphadenopathy.  There is a new left lobe nodularity in December and he has follow up CT scheduled.     Current medicines are reviewed at length with the patient today.  The patient does not have concerns regarding medicines.  The following changes have been made: None  Labs/ tests ordered today include: None  No orders of the defined types were placed in this encounter.    Disposition:   FU with me in 12 months.   Signed, Minus Breeding, MD  10/03/2022 9:56 AM    [place]

## 2022-10-03 ENCOUNTER — Encounter: Payer: Self-pay | Admitting: Cardiology

## 2022-10-03 ENCOUNTER — Ambulatory Visit: Payer: Medicare Other | Attending: Cardiology | Admitting: Cardiology

## 2022-10-03 VITALS — BP 136/78 | HR 67 | Ht 69.5 in | Wt 162.6 lb

## 2022-10-03 DIAGNOSIS — R9389 Abnormal findings on diagnostic imaging of other specified body structures: Secondary | ICD-10-CM | POA: Diagnosis not present

## 2022-10-03 DIAGNOSIS — R072 Precordial pain: Secondary | ICD-10-CM | POA: Diagnosis not present

## 2022-10-03 DIAGNOSIS — E785 Hyperlipidemia, unspecified: Secondary | ICD-10-CM | POA: Diagnosis not present

## 2022-10-03 DIAGNOSIS — Z952 Presence of prosthetic heart valve: Secondary | ICD-10-CM | POA: Insufficient documentation

## 2022-10-03 DIAGNOSIS — R001 Bradycardia, unspecified: Secondary | ICD-10-CM | POA: Diagnosis not present

## 2022-10-03 NOTE — Patient Instructions (Signed)
Medication Instructions:  No changes *If you need a refill on your cardiac medications before your next appointment, please call your pharmacy*   Lab Work: None If you have labs (blood work) drawn today and your tests are completely normal, you will receive your results only by: [place] (if you have MyChart) OR A paper copy in the mail If you have any lab test that is abnormal or we need to change your treatment, we will call you to review the results.   Testing/Procedures: None   Follow-Up: At [facility], you and your health needs are our priority.  As part of our continuing mission to provide you with exceptional heart care, we have created designated Provider Care Teams.  These Care Teams include your primary Cardiologist (physician) and Advanced Practice Providers (APPs -  Physician Assistants and Nurse Practitioners) who all work together to provide you with the care you need, when you need it.  We recommend signing up for the patient portal called "MyChart".  Sign up information is provided on this After Visit Summary.  MyChart is used to connect with patients for Virtual Visits (Telemedicine).  Patients are able to view lab/test results, encounter notes, upcoming appointments, etc.  Non-urgent messages can be sent to your provider as well.   To learn more about what you can do with MyChart, go to [email or web address].    Your next appointment:   1 year(s)  Provider:   Minus Breeding, MD

## 2022-10-20 DIAGNOSIS — H2512 Age-related nuclear cataract, left eye: Secondary | ICD-10-CM | POA: Diagnosis not present

## 2022-10-20 DIAGNOSIS — H524 Presbyopia: Secondary | ICD-10-CM | POA: Diagnosis not present

## 2022-10-29 ENCOUNTER — Ambulatory Visit (HOSPITAL_COMMUNITY)
Admission: RE | Admit: 2022-10-29 | Discharge: 2022-10-29 | Disposition: A | Discharge status: Home / Self Care | Payer: Medicare Other | Source: Ambulatory Visit | Attending: Physician Assistant | Admitting: Physician Assistant

## 2022-10-29 DIAGNOSIS — R918 Other nonspecific abnormal finding of lung field: Secondary | ICD-10-CM | POA: Diagnosis not present

## 2022-10-29 DIAGNOSIS — R911 Solitary pulmonary nodule: Secondary | ICD-10-CM | POA: Diagnosis not present

## 2022-10-29 DIAGNOSIS — J9811 Atelectasis: Secondary | ICD-10-CM | POA: Diagnosis not present

## 2023-02-04 ENCOUNTER — Other Ambulatory Visit: Payer: Self-pay | Admitting: *Deleted

## 2023-02-04 ENCOUNTER — Other Ambulatory Visit: Payer: Self-pay | Admitting: Physician Assistant

## 2023-02-04 ENCOUNTER — Telehealth: Payer: Self-pay | Admitting: Physician Assistant

## 2023-02-04 MED ORDER — MIRTAZAPINE 30 MG PO TABS: ORAL_TABLET | ORAL | 0 refills | Status: DC

## 2023-02-04 NOTE — Progress Notes (Signed)
Pt called due to Rx for Remeron denied. Spoke to pt told him I am sorry thought it was discontinued.  Lelon Mast said okay to refill. Told him will send Rx to pharmacy now. Asked him if taking disintegrating tablet? Pt said no, just a regular tablet. Told him okay. Rx sent to pharmacy. Pt verbalized understanding.

## 2023-02-24 ENCOUNTER — Other Ambulatory Visit: Payer: Self-pay | Admitting: Physician Assistant

## 2023-02-24 DIAGNOSIS — K219 Gastro-esophageal reflux disease without esophagitis: Secondary | ICD-10-CM

## 2023-04-02 ENCOUNTER — Ambulatory Visit: Payer: Medicare Other | Admitting: Physician Assistant

## 2023-04-14 ENCOUNTER — Other Ambulatory Visit (HOSPITAL_COMMUNITY)
Admission: AD | Admit: 2023-04-14 | Discharge: 2023-04-14 | Disposition: A | Discharge status: Home / Self Care | Payer: Medicare Other | Source: Ambulatory Visit | Attending: Ophthalmology | Admitting: Ophthalmology

## 2023-04-14 ENCOUNTER — Encounter (HOSPITAL_COMMUNITY): Payer: Self-pay | Admitting: Ophthalmology

## 2023-04-14 DIAGNOSIS — G453 Amaurosis fugax: Secondary | ICD-10-CM | POA: Insufficient documentation

## 2023-04-14 LAB — C-REACTIVE PROTEIN: CRP: 0.6 mg/dL (ref <1.0)

## 2023-04-14 LAB — SEDIMENTATION RATE: Sed Rate: 6 mm/hr (ref 0–16)

## 2023-04-15 ENCOUNTER — Other Ambulatory Visit (HOSPITAL_COMMUNITY): Payer: Self-pay | Admitting: Ophthalmology

## 2023-04-15 DIAGNOSIS — G453 Amaurosis fugax: Secondary | ICD-10-CM

## 2023-04-20 ENCOUNTER — Ambulatory Visit (HOSPITAL_COMMUNITY): Payer: Medicare Other | Attending: Ophthalmology

## 2023-04-20 DIAGNOSIS — G453 Amaurosis fugax: Secondary | ICD-10-CM | POA: Insufficient documentation

## 2023-04-20 LAB — ECHOCARDIOGRAM COMPLETE
AR max vel: 3.88 cm2
AV Area VTI: 4.13 cm2
AV Area mean vel: 3.82 cm2
AV Mean grad: 5 mmHg
AV Peak grad: 9.1 mmHg
Ao pk vel: 1.51 m/s
Area-P 1/2: 2.99 cm2
MV M vel: 4.44 m/s
MV Peak grad: 78.9 mmHg
S' Lateral: 3 cm

## 2023-04-21 DIAGNOSIS — H2512 Age-related nuclear cataract, left eye: Secondary | ICD-10-CM | POA: Diagnosis not present

## 2023-05-07 ENCOUNTER — Other Ambulatory Visit (HOSPITAL_COMMUNITY): Payer: Self-pay | Admitting: Ophthalmology

## 2023-05-07 DIAGNOSIS — G453 Amaurosis fugax: Secondary | ICD-10-CM

## 2023-05-13 ENCOUNTER — Ambulatory Visit (HOSPITAL_COMMUNITY)
Admission: RE | Admit: 2023-05-13 | Discharge: 2023-05-13 | Disposition: A | Discharge status: Home / Self Care | Payer: Medicare Other | Source: Ambulatory Visit | Attending: Cardiovascular Disease | Admitting: Cardiovascular Disease

## 2023-05-13 DIAGNOSIS — G453 Amaurosis fugax: Secondary | ICD-10-CM

## 2023-06-05 DIAGNOSIS — Z23 Encounter for immunization: Secondary | ICD-10-CM | POA: Diagnosis not present

## 2023-06-12 ENCOUNTER — Other Ambulatory Visit: Payer: Self-pay | Admitting: Physician Assistant

## 2023-07-02 NOTE — Progress Notes (Signed)
[facility]   MULTIDISCIPLINARY HEART VALVE CLINIC                                     Cardiology Office Note:    Date:  07/03/2023   ID:  Harold Leach, DOB 1941/08/19, MRN [id number]  PCP:  Jarold Motto, PA  [facility] Cardiologist:  Rollene Rotunda, MD / Dr. Lynnette Caffey & Dr. Delia Chimes (TAVR)  [facility] Electrophysiologist:  None   Referring MD: Jarold Motto, PA   1 year s/p TAVR  History of Present Illness:    Harold Leach is a 82 y.o. 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   Echo 01/28/22 showed EF 60% and severe AS with a mean grad 34.2 mmHg, AVA 0.76 cm2, as well as mild to moderate MR. [facility] 06/06/22 showed normal cors. S/p TAVR with a 29 mm Edwards Sapien 3 Ultra Resilia THV via the TF approach on 07/22/22. Post operative echo showed EF 60%, normally functioning TAVR with a mean gradient of 6 mmHg and trivial PVL. Continued on Asprin 81mg  daily. 1 month echo showed EF 60%, normally functioning TAVR with a mean gradient of 6 mm hg and no PVL as well as mild dilatation of the ascending aorta, measuring 41 mm.  Had an episode where he lost vision in one eye. Carotid dopplers were clear. Seen by eye dr.   Today the patient presents to clinic for follow up. No CP or SOB. No LE edema, orthopnea or PND. No dizziness or syncope. No blood in stool or urine. No palpitations.    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 Lynnette Caffey and Dr. Delia Chimes.    Past Surgical History:  Procedure Laterality Date   ABDOMINAL AORTOGRAM N/A 06/06/2022   Procedure: ABDOMINAL AORTOGRAM;  Surgeon: Orbie Pyo, MD;  Location: [facility] INVAS[facility] CV LAB;  Service: Cardiovascular;  Laterality: N/A;   BALLOON AORTIC VALVE VALVULOPLASTY  07/22/2022   Procedure: BALLOON AORTIC VALVE VALVULOPLASTY;  Surgeon: Orbie Pyo, MD;  Location: [facility] OR;  Service: Open Heart Surgery;;   HERNIA REPAIR     INTRAOPERATIVE TRANSTHORACIC ECHOCARDIOGRAM N/A 07/22/2022   Procedure: INTRAOPERATIVE TRANSTHORACIC ECHOCARDIOGRAM;  Surgeon: Orbie Pyo, MD;  Location: [facility] OR;  Service: Open Heart Surgery;  Laterality: N/A;   PROSTATE SURGERY  2012   RIGHT/LEFT HEART CATH AND CORONARY ANGIOGRAPHY N/A 06/06/2022   Procedure: RIGHT/LEFT HEART CATH AND CORONARY ANGIOGRAPHY;  Surgeon: Orbie Pyo, MD;  Location: [facility]VAS[facility] CV LAB;  Service: Cardiovascular;  Laterality: N/A;   TRANSCATHETER AORTIC VALVE REPLACEMENT, TRANSFEMORAL N/A 07/22/2022   Procedure: Transcatheter Aortic Valve Replacement, Transfemoral;  Surgeon: Orbie Pyo, MD;  [facility];  Service: Open Heart Surgery;  Laterality: N/A;   ULTRASOUND GUIDANCE FOR VASCULAR ACCESS N/A 07/22/2022   Procedure: ULTRASOUND GUIDANCE FOR VASCULAR ACCESS;  Surgeon: Orbie Pyo, MD;  Location: [facility] OR;  Service: Open Heart Surgery;  Laterality: N/A;    Current Medications: Current Meds  Medication Sig   amoxicillin (AMOXIL) 500 MG tablet Take 4 tablets by mouth one hour prior to dental appointments.   aspirin EC 81 MG tablet Take 81 mg by mouth daily. Swallow whole.   Cholecalciferol (D3-1000 PO) Take 1,000 Units by mouth every other day.   CINNAMON PO Take 1,200 mg by mouth daily.   clobetasol cream (TEMOVATE) 0.05 % APPLY TOPICALLY TO THE AFFECTED AREA TWICE DAILY (Patient taking differently: Apply 1 Application topically 2 (two) times daily as needed (irritation).)    cyanocobalamin (VITAMIN B12) 1000 MCG tablet Take 1,000 mcg by mouth daily.   Ginkgo Biloba 60 MG CAPS Take 60 mg by mouth daily.   Melatonin 10 MG TABS Take 10 mg by mouth at bedtime.   mirtazapine (REMERON) 30 MG tablet TAKE 1 TO 1 AND 1/2 TABLETS BY MOUTH AT NIGHT   Multiple Vitamins-Minerals (PRESERVISION AREDS 2+MULTI VIT PO) Take 2 capsules by mouth daily.   OVER THE COUNTER MEDICATION Take 4 tablets by mouth daily. Acuitol (Advanced Mental Acuity Support)   pantoprazole (PROTONIX) 40 MG tablet TAKE 1 TABLET(40 MG) BY MOUTH DAILY   Polyethyl Glycol-Propyl Glycol (SYSTANE OP) Place 1 drop into both eyes daily as needed (dry eye).   traZODone (DESYREL) 150 MG tablet Take 50-100 mg by mouth See admin instructions. Take 50 mg at bedtime, may increase to 100 mg as needed for severe insomnia   Turmeric (QC TUMERIC COMPLEX PO) Take 1,000 mg by mouth daily.     Allergies:   Patient has no known allergies.   Social History   Socioeconomic History   Marital status: Married    Spouse name: Not on file   Number of children: Not on file   Years of education: Not on file   Highest education level: Not on file  Occupational History   Occupation: Retired  Tobacco Use   Smoking status: Former    Types: Pipe    Quit date: 03/20/1981    Years since quitting: 42.3   Smokeless tobacco: Never   Tobacco comments:    Quit in 1983 pipe smoker   Substance and Sexual Activity   Alcohol use: No   Drug use: No   Sexual activity: Yes    Partners: Female  Other Topics Concern   Not on file  Social History Narrative   Not on file   Social Determinants of Health   Financial Resource Strain: Low Risk  (05/12/2022)   Overall Financial Resource Strain (CARDIA)    Difficulty of Paying Living Expenses: Not hard at all  Food Insecurity: No Food Insecurity (05/12/2022)   Hunger Vital Sign    Worried About Running Out of Food in the Last Year: Never true    Ran Out of Food in the Last Year: Never true   Transportation Needs: No Transportation Needs (05/12/2022)   PRAPARE - Administrator, Civil Service (Medical): No    Lack of Transportation (Non-Medical): No  Physical Activity: Insufficiently Active (05/12/2022)   Exercise Vital Sign    Days of Exercise per Week: 7 days    Minutes of Exercise per Session: 20 min  Stress: No Stress Concern Present (05/12/2022)   [employer] of Occupational Health - Occupational Stress Questionnaire    Feeling of Stress : Not at all  Social Connections: Moderately Integrated (05/12/2022)   Social Connection and Isolation Panel [NHANES]  Frequency of Communication with Friends and Family: Once a week    Frequency of Social Gatherings with Friends and Family: More than three times a week    Attends Religious Services: Never    Database administrator or Organizations: Yes    Attends Engineer, structural: 1 to 4 times per year    Marital Status: Married     Family History: The patient's family history includes Heart disease in his maternal grandmother and mother; Hypertension in his mother; Prostate cancer in his father; Stroke in his maternal grandmother; Valvular heart disease in his brother.  ROS:   Please see the history of present illness.    All other systems reviewed and are negative.   EKG:  EKG is NOT ordered today.   Recent Labs: 07/18/2022: ALT 32 07/23/2022: BUN 23; Creatinine, Ser 1.46; Hemoglobin 11.9; Magnesium 1.9; Platelets 118; Potassium 4.0; Sodium 139  Recent Lipid Panel    Component Value Date/Time   CHOL 164 07/25/2022 0943   TRIG 89.0 07/25/2022 0943   HDL 59.70 07/25/2022 0943   CHOLHDL 3 07/25/2022 0943   VLDL 17.8 07/25/2022 0943   LDLCALC 86 07/25/2022 0943   LDLCALC 98 07/23/2020 0936     Risk Assessment/Calculations:       Physical Exam:    VS:  BP 130/60 (BP Location: Left Arm, Patient Position: Sitting, Cuff Size: Normal)   Pulse 98   Ht 5' 9.5" (1.765 m)   Wt 160 lb 3.2 oz  (72.7 kg)   SpO2 98%   BMI 23.32 kg/m     Wt Readings from Last 3 Encounters:  07/03/23 160 lb 3.2 oz (72.7 kg)  10/03/22 162 lb 9.6 oz (73.8 kg)  08/22/22 158 lb 9.6 oz (71.9 kg)     GEN:  Well nourished, well developed in no acute distress HEENT: Normal NECK: No JVD LYMPHATICS: No lymphadenopathy CARDIAC: RRR, no murmurs, rubs, gallops RESPIRATORY:  Clear to auscultation without rales, wheezing or rhonchi  ABDOMEN: Soft, non-tender, non-distended MUSCULOSKELETAL:  No edema; No deformity  SKIN: Warm and dry.   NEUROLOGIC:  Alert and oriented x 3 PSYCHIATRIC:  Normal affect   ASSESSMENT:    1. S/P TAVR (transcatheter aortic valve replacement)   2. Chronic diastolic CHF (congestive heart failure) (HCC)   3. Lymphadenopathy   4. Pulmonary nodules   5. Thoracic aortic aneurysm (TAA), unspecified part, unspecified whether ruptured (HCC)     PLAN:    In order of problems listed above:  Severe AS s/p TAVR: echo today shows EF 55%, normally functioning TAVR with a mean gradient of 4 mm hg and trivial PVL as well as mild MR and moderate dilation of aorta at 44 mm. He has had a marked clinical improvement in symptoms and has NYHA class I symptoms. SBE prophylaxis discussed; he has amoxicillin. Continue regular follow up with Dr. Antoine Poche.   Chronic diastolic CHF: appears euvolemic off diuretics   Lymphadenopathy: pre TAVR CTs reported "enlarged right-greater-than-left hilar lymph nodes, possibly reactive. Recommend follow-up chest CT in 3 months to ensure stability or resolution." Repeat CT did not mention lymphadenopathy   Pulmonary nodules: CT 10/29/22 showed "peripheral opacity of the left lower lobe which was described on prior exam is similar in size but appears less dense, favored to be evolving scarring or shifting atelectasis. Additional follow-up could be considered in 3 months to ensure complete resolution." Pt was offered to repeat the CT scan but decided not to. Will assess  on upcoming  CTA  TAA: noted to have aneurysmal sinus/ascending thoracic aorta on pre TAVR scans. Echo showed moderate dilation at 44mm. Will get a bmet today and a CT angio to assess aorta.   PVCs: noted on echo. Asymptomatic.    Medication Adjustments/Labs and Tests Ordered: Current medicines are reviewed at length with the patient today.  Concerns regarding medicines are outlined above.  Orders Placed This Encounter  Procedures   CT ANGIO CHEST AORTA W/CM & OR WO/CM   Basic metabolic panel   No orders of the defined types were placed in this encounter.   Patient Instructions  Medication Instructions:  Your physician recommends that you continue on your current medications as directed. Please refer to the Current Medication list given to you today.  *If you need a refill on your cardiac medications before your next appointment, please call your pharmacy*   Lab Work: BMET - Today   If you have labs (blood work) drawn today and your tests are completely normal, you will receive your results only by: MyChart Message (if you have MyChart) OR A paper copy in the mail If you have any lab test that is abnormal or we need to change your treatment, we will call you to review the results.   Testing/Procedures: Your physician has requested that you have a CT angio of your Aorta    Follow-Up: Follow up as scheduled   Other Instructions     Signed, Cline Crock, PA-C  07/03/2023 11:03 AM    [facility]

## 2023-07-03 ENCOUNTER — Other Ambulatory Visit (HOSPITAL_COMMUNITY): Payer: Medicare Other

## 2023-07-03 ENCOUNTER — Ambulatory Visit (HOSPITAL_COMMUNITY): Payer: Medicare Other | Attending: Cardiology | Admitting: Physician Assistant

## 2023-07-03 ENCOUNTER — Ambulatory Visit (HOSPITAL_BASED_OUTPATIENT_CLINIC_OR_DEPARTMENT_OTHER): Payer: Medicare Other

## 2023-07-03 VITALS — BP 130/60 | HR 98 | Ht 69.5 in | Wt 160.2 lb

## 2023-07-03 DIAGNOSIS — Z952 Presence of prosthetic heart valve: Secondary | ICD-10-CM

## 2023-07-03 DIAGNOSIS — I712 Thoracic aortic aneurysm, without rupture, unspecified: Secondary | ICD-10-CM | POA: Diagnosis not present

## 2023-07-03 DIAGNOSIS — R591 Generalized enlarged lymph nodes: Secondary | ICD-10-CM | POA: Diagnosis not present

## 2023-07-03 DIAGNOSIS — I5032 Chronic diastolic (congestive) heart failure: Secondary | ICD-10-CM | POA: Insufficient documentation

## 2023-07-03 DIAGNOSIS — R918 Other nonspecific abnormal finding of lung field: Secondary | ICD-10-CM | POA: Diagnosis not present

## 2023-07-03 LAB — ECHOCARDIOGRAM COMPLETE
AR max vel: 3.17 cm2
AV Area VTI: 3.14 cm2
AV Area mean vel: 3.02 cm2
AV Mean grad: 4 mm[Hg]
AV Peak grad: 9 mm[Hg]
Ao pk vel: 1.5 m/s
Area-P 1/2: 2.81 cm2
S' Lateral: 3.1 cm

## 2023-07-03 LAB — BASIC METABOLIC PANEL
BUN/Creatinine Ratio: 20 (ref 10–24)
BUN: 24 mg/dL (ref 8–27)
CO2: 26 mmol/L (ref 20–29)
Calcium: 9.3 mg/dL (ref 8.6–10.2)
Chloride: 103 mmol/L (ref 96–106)
Creatinine, Ser: 1.22 mg/dL (ref 0.76–1.27)
Glucose: 83 mg/dL (ref 70–99)
Potassium: 4.5 mmol/L (ref 3.5–5.2)
Sodium: 140 mmol/L (ref 134–144)
eGFR: 60 mL/min/{1.73_m2} (ref >59)

## 2023-07-03 NOTE — Patient Instructions (Addendum)
Medication Instructions:  Your physician recommends that you continue on your current medications as directed. Please refer to the Current Medication list given to you today.  *If you need a refill on your cardiac medications before your next appointment, please call your pharmacy*   Lab Work: BMET - Today   If you have labs (blood work) drawn today and your tests are completely normal, you will receive your results only by: MyChart Message (if you have MyChart) OR A paper copy in the mail If you have any lab test that is abnormal or we need to change your treatment, we will call you to review the results.   Testing/Procedures: Your physician has requested that you have a CT angio of your Aorta    Follow-Up: Follow up as scheduled   Other Instructions

## 2023-07-13 ENCOUNTER — Ambulatory Visit (HOSPITAL_BASED_OUTPATIENT_CLINIC_OR_DEPARTMENT_OTHER)
Admission: RE | Admit: 2023-07-13 | Discharge: 2023-07-13 | Disposition: A | Discharge status: Home / Self Care | Payer: Medicare Other | Source: Ambulatory Visit | Attending: Physician Assistant | Admitting: Physician Assistant

## 2023-07-13 DIAGNOSIS — I517 Cardiomegaly: Secondary | ICD-10-CM | POA: Diagnosis not present

## 2023-07-13 DIAGNOSIS — I712 Thoracic aortic aneurysm, without rupture, unspecified: Secondary | ICD-10-CM | POA: Diagnosis not present

## 2023-07-13 DIAGNOSIS — I77810 Thoracic aortic ectasia: Secondary | ICD-10-CM | POA: Diagnosis not present

## 2023-07-13 DIAGNOSIS — K7689 Other specified diseases of liver: Secondary | ICD-10-CM | POA: Diagnosis not present

## 2023-07-13 DIAGNOSIS — N281 Cyst of kidney, acquired: Secondary | ICD-10-CM | POA: Diagnosis not present

## 2023-07-13 MED ORDER — IOHEXOL 350 MG/ML SOLN
100.0000 mL | Freq: Once | INTRAVENOUS | Status: AC | PRN
  Administered 2023-07-13: 100 mL via INTRAVENOUS

## 2023-07-14 ENCOUNTER — Ambulatory Visit (INDEPENDENT_AMBULATORY_CARE_PROVIDER_SITE_OTHER): Payer: Medicare Other

## 2023-07-14 VITALS — Wt 160.0 lb

## 2023-07-14 DIAGNOSIS — Z0000 Encounter for general adult medical examination without abnormal findings: Secondary | ICD-10-CM

## 2023-07-14 NOTE — Patient Instructions (Signed)
Harold Leach , Thank you for taking time to come for your Medicare Wellness Visit. I appreciate your ongoing commitment to your health goals. Please review the following plan we discussed and let me know if I can assist you in the future.   Referrals/Orders/Follow-Ups/Clinician Recommendations: Aim for 30 minutes of exercise or brisk walking, 6-8 glasses of water, and 5 servings of fruits and vegetables each day.    This is a list of the screening recommended for you and due dates:  Health Maintenance  Topic Date Due   Medicare Annual Wellness Visit  05/13/2023   COVID-19 Vaccine (6 - 2023-24 season) 05/24/2023   DTaP/Tdap/Td vaccine (2 - Td or Tdap) 09/12/2026   Pneumonia Vaccine  Completed   Flu Shot  Completed   Hepatitis C Screening  Completed   Zoster (Shingles) Vaccine  Completed   HPV Vaccine  Aged Out   Colon Cancer Screening  Discontinued    Advanced directives: (In Chart) A copy of your advanced directives are scanned into your chart should your provider ever need it.  Next Medicare Annual Wellness Visit scheduled for next year: Yes

## 2023-07-14 NOTE — Progress Notes (Signed)
Subjective:   Harold Leach is a 82 y.o. male who presents for Medicare Annual/Subsequent preventive examination.  Visit Complete: Virtual I connected with  Gabrielle Dare on 07/14/23 by a audio enabled telemedicine application and verified that I am speaking with the correct person using two identifiers.  Patient Location: Home  Provider Location: Office/Clinic  I discussed the limitations of evaluation and management by telemedicine. The patient expressed understanding and agreed to proceed.  Vital Signs: Because this visit was a virtual/telehealth visit, some criteria may be missing or patient reported. Any vitals not documented were not able to be obtained and vitals that have been documented are patient reported.   Cardiac Risk Factors include: advanced age (>31men, >33 women);dyslipidemia;male gender     Objective:    Today's Vitals   07/14/23 1057  Weight: 160 lb (72.6 kg)   Body mass index is 23.29 kg/m.     07/14/2023   11:02 AM 07/22/2022    8:33 AM 06/06/2022    9:49 AM 05/12/2022    1:59 PM 05/03/2021   11:43 AM 04/26/2019   10:37 AM 01/21/2018    4:32 PM  Advanced Directives  Does Patient Have a Medical Advance Directive? Yes No Yes Yes Yes Yes Yes  Type of Estate agent of [place];Living will  Healthcare Power of [place];Living will Healthcare Power of [place];Living will Healthcare Power of [employer] of [place];Living will   Does patient want to make changes to medical advance directive? No - Patient declined   No - Patient declined     Copy of Healthcare Power of Attorney in Chart? Yes - validated most recent copy scanned in chart (See row information)   Yes - validated most recent copy scanned in chart (See row information) Yes - validated most recent copy scanned in chart (See row information) No - copy requested   Would patient like information on creating a medical advance directive?  No - Patient  declined         Current Medications (verified) Outpatient Encounter Medications as of 07/14/2023  Medication Sig   aspirin EC 81 MG tablet Take 81 mg by mouth daily. Swallow whole.   Cholecalciferol (D3-1000 PO) Take 1,000 Units by mouth every other day.   CINNAMON PO Take 1,200 mg by mouth daily.   clobetasol cream (TEMOVATE) 0.05 % APPLY TOPICALLY TO THE AFFECTED AREA TWICE DAILY (Patient taking differently: Apply 1 Application topically 2 (two) times daily as needed (irritation).)   cyanocobalamin (VITAMIN B12) 1000 MCG tablet Take 1,000 mcg by mouth daily.   Ginkgo Biloba 60 MG CAPS Take 60 mg by mouth daily.   Melatonin 10 MG TABS Take 10 mg by mouth at bedtime.   mirtazapine (REMERON) 30 MG tablet TAKE 1 TO 1 AND 1/2 TABLETS BY MOUTH AT NIGHT   Multiple Vitamins-Minerals (PRESERVISION AREDS 2+MULTI VIT PO) Take 2 capsules by mouth daily.   pantoprazole (PROTONIX) 40 MG tablet TAKE 1 TABLET(40 MG) BY MOUTH DAILY   Polyethyl Glycol-Propyl Glycol (SYSTANE OP) Place 1 drop into both eyes daily as needed (dry eye).   traZODone (DESYREL) 150 MG tablet Take 50-100 mg by mouth See admin instructions. Take 50 mg at bedtime, may increase to 100 mg as needed for severe insomnia   Turmeric (QC TUMERIC COMPLEX PO) Take 1,000 mg by mouth daily.   amoxicillin (AMOXIL) 500 MG tablet Take 4 tablets by mouth one hour prior to dental appointments. (Patient not taking: Reported on 07/14/2023)  OVER THE COUNTER MEDICATION Take 4 tablets by mouth daily. Acuitol (Advanced Mental Acuity Support) (Patient not taking: Reported on 07/14/2023)   No facility-administered encounter medications on file as of 07/14/2023.    Allergies (verified) Patient has no known allergies.   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 Lynnette Caffey and Dr. Delia Chimes.   Past Surgical History:  Procedure Laterality Date   ABDOMINAL AORTOGRAM N/A 06/06/2022   Procedure: ABDOMINAL AORTOGRAM;  Surgeon: Orbie Pyo, MD;  Location: [facility]ASIVE [facility];  Service: Cardiovascular;  Laterality: N/A;   BALLOON AORTIC VALVE VALVULOPLASTY  07/22/2022   Procedure: BALLOON AORTIC VALVE VALVULOPLASTY;  Surgeon: Orbie Pyo, MD;  Location: [facility] OR;  Service: Open Heart Surgery;;   HERNIA REPAIR     INTRAOPERATIVE TRANSTHORACIC ECHOCARDIOGRAM N/A 07/22/2022   Procedure: INTRAOPERATIVE TRANSTHORACIC ECHOCARDIOGRAM;  Surgeon: Orbie Pyo, MD;  Location[facility];  Service: Open Heart Surgery;  Laterality: N/A;   PROSTATE SURGERY  2012   RIGHT/LEFT HEART CATH AND CORONARY ANGIOGRAPHY N/A 06/06/2022   Procedure: RIGHT/LEFT HEART CATH AND CORONARY ANGIOGRAPHY;  Surgeon: Orbie Pyo, MD;  Location: [facility];  Service: Cardiovascular;  Laterality: N/A;   TRANSCATHETER AORTIC VALVE REPLACEMENT, TRANSFEMORAL N/A 07/22/2022   Procedure: Transcatheter Aortic Valve Replacement, Transfemoral;  Surgeon: Orbie Pyo, MD;  Location: [facility] OR;  Service: Open Heart Surgery;  Laterality: N/A;   ULTRASOUND GUIDANCE FOR VASCULAR ACCESS N/A 07/22/2022   Procedure: ULTRASOUND GUIDANCE FOR VASCULAR ACCESS;  Surgeon: Orbie Pyo, MD;  Location: [facility] OR;  Service: Open Heart Surgery;  Laterality: N/A;   Family History  Problem Relation Age of Onset   Heart disease Mother        CHF   Hypertension Mother    Prostate cancer Father    Valvular heart disease Brother    Heart disease Maternal Grandmother    Stroke Maternal Grandmother    Social History   Socioeconomic History   Marital  status: Married    Spouse name: Not on file   Number of children: Not on file   Years of education: Not on file   Highest education level: Not on file  Occupational History   Occupation: Retired  Tobacco Use   Smoking status: Former    Types: Pipe    Quit date: 03/20/1981    Years since quitting: 42.3   Smokeless tobacco: Never   Tobacco comments:    Quit in 1983 pipe smoker   Substance and Sexual Activity   Alcohol use: No   Drug use: No   Sexual activity: Yes    Partners: Female  Other Topics Concern   Not on file  Social History Narrative   Not on file   Social Determinants of Health   Financial Resource Strain: Low Risk  (07/14/2023)   Overall Financial Resource Strain (CARDIA)    Difficulty of Paying Living Expenses: Not hard at all  Food Insecurity: No Food Insecurity (07/14/2023)   Hunger Vital Sign    Worried About Running Out of Food in the  Last Year: Never true    Ran Out of Food in the Last Year: Never true  Transportation Needs: No Transportation Needs (07/14/2023)   PRAPARE - Administrator, Civil Service (Medical): No    Lack of Transportation (Non-Medical): No  Physical Activity: Sufficiently Active (07/14/2023)   Exercise Vital Sign    Days of Exercise per Week: 7 days    Minutes of Exercise per Session: 30 min  Stress: No Stress Concern Present (07/14/2023)   [employer] of Occupational Health - Occupational Stress Questionnaire    Feeling of Stress : Not at all  Social Connections: Socially Integrated (07/14/2023)   Social Connection and Isolation Panel [NHANES]    Frequency of Communication with Friends and Family: Once a week    Frequency of Social Gatherings with Friends and Family: More than three times a week    Attends Religious Services: More than 4 times per year    Active Member of [employer] or Organizations: Yes    Attends Banker Meetings: 1 to 4 times per year    Marital Status: Married    Tobacco  Counseling Counseling given: Not Answered Tobacco comments: Quit in 1983 pipe smoker    Clinical Intake:  Pre-visit preparation completed: Yes  Pain : No/denies pain     BMI - recorded: 23.29 Nutritional Status: BMI of 19-24  Normal Nutritional Risks: None Diabetes: No  How often do you need to have someone help you when you read instructions, pamphlets, or other written materials from your doctor or pharmacy?: 1 - Never  Interpreter Needed?: No  Information entered by :: Lanier Ensign, LPN   Activities of Daily Living    07/14/2023   10:59 AM 07/22/2022    8:36 AM  In your present state of health, do you have any difficulty performing the following activities:  Hearing? 1 0  Comment hearing aids   Vision? 0 0  Difficulty concentrating or making decisions? 0 0  Walking or climbing stairs? 0 0  Dressing or bathing? 0 0  Doing errands, shopping? 0   Preparing Food and eating ? N   Using the Toilet? N   In the past six months, have you accidently leaked urine? Y   Comment wears a pad   Do you have problems with loss of bowel control? N   Managing your Medications? N   Managing your Finances? N   Housekeeping or managing your Housekeeping? N     Patient Care Team: Jarold Motto, [place] as PCP - General (Physician Assistant) Rollene Rotunda, MD as PCP - Cardiology (Cardiology) Heloise Purpura, MD as Consulting Physician (Urology)  Indicate any recent Medical Services you may have received from other than [facility] providers in the past year (date may be approximate).     Assessment:   This is a routine wellness examination for Harold Leach.  Hearing/Vision screen Hearing Screening - Comments:: Pt has hearing aids  Vision Screening - Comments:: Pt follows up with Dr Cathey Endow  for annual eye exams    Goals Addressed             This Visit's Progress    Patient Stated       Maintain health and activity        Depression Screen    07/14/2023   11:02 AM  05/12/2022    1:58 PM 07/24/2021    8:33 AM 05/03/2021   11:39 AM 07/23/2020    8:32 AM 04/26/2020   12:04 PM 07/08/2019  9:50 AM  PHQ 2/9 Scores  PHQ - 2 Score 0 0 0 0 0 0 0  PHQ- 9 Score       2    Fall Risk    07/14/2023   11:05 AM 05/12/2022    1:58 PM 07/24/2021    8:33 AM 05/03/2021   11:44 AM 04/26/2020   12:09 PM  Fall Risk   Falls in the past year? 1 0  1 1  Number falls in past yr: 1 0 1 1 1   Injury with Fall? 0  0 0 0  Risk for fall due to : History of fall(s)  Other (Comment) Impaired vision Impaired vision  Risk for fall due to: Comment   Pt says he trips alot in his yard over roots, does not watch wear he is walking    Follow up Falls prevention discussed Falls evaluation completed Falls evaluation completed Falls prevention discussed Falls prevention discussed    MEDICARE RISK AT HOME: Medicare Risk at Home Any stairs in or around the home?: Yes If so, are there any without handrails?: No Home free of loose throw rugs in walkways, pet beds, electrical cords, etc?: Yes Adequate lighting in your home to reduce risk of falls?: Yes Life alert?: No Use of a cane, walker or w/c?: No Grab bars in the bathroom?: No Shower chair or bench in shower?: No Elevated toilet seat or a handicapped toilet?: No  TIMED UP AND GO:  Was the test performed?  No    Cognitive Function:    01/02/2017    9:19 AM  MMSE - Mini Mental State Exam  Orientation to time 5  Orientation to Place 5  Registration 3  Attention/ Calculation 5  Recall 3  Language- name 2 objects 2  Language- repeat 1  Language- follow 3 step command 3  Language- read & follow direction 1  Write a sentence 1  Copy design 1  Total score 30        07/14/2023   11:06 AM 05/12/2022    2:13 PM 05/03/2021   11:47 AM 04/26/2020   12:13 PM 04/26/2019   10:45 AM  6CIT Screen  What Year? 0 points 0 points 0 points 0 points 0 points  What month? 0 points 0 points 0 points 0 points 0 points  What time? 0 points 0  points 0 points  0 points  Count back from 20 0 points 0 points 0 points 0 points 0 points  Months in reverse 2 points 0 points 0 points 0 points 0 points  Repeat phrase 0 points 0 points 0 points 0 points 0 points  Total Score 2 points 0 points 0 points  0 points    Immunizations Immunization History  Administered Date(s) Administered   Fluad Quad(high Dose 65+) 07/25/2022   Influenza Whole 06/29/2017   Influenza, High Dose Seasonal PF 08/21/2016, 07/07/2018, 06/06/2019, 06/13/2020, 07/10/2021   Influenza, Seasonal, Injecte, Preservative Fre 06/29/2017   Influenza,inj,quad, With Preservative 06/23/2019   Influenza-Unspecified 08/21/2016   Moderna Covid-19 Vaccine Bivalent Booster 61yrs & up 06/04/2021   Moderna Sars-Covid-2 Vaccination 10/04/2019, 11/04/2019, 06/27/2020, 04/02/2022   Pneumococcal Conjugate-13 10/10/2015   Pneumococcal Polysaccharide-23 10/06/2009   Tdap 09/12/2016   Zoster Recombinant(Shingrix) 06/06/2019, 12/18/2019    TDAP status: Up to date  Flu Vaccine status: Up to date  Pneumococcal vaccine status: Up to date  Covid-19 vaccine status: Information provided on how to obtain vaccines.   Qualifies for Shingles Vaccine? Yes   Zostavax  completed Yes   Shingrix Completed?: Yes  Screening Tests Health Maintenance  Topic Date Due   COVID-19 Vaccine (6 - 2023-24 season) 05/24/2023   Medicare Annual Wellness (AWV)  07/13/2024   DTaP/Tdap/Td (2 - Td or Tdap) 09/12/2026   Pneumonia Vaccine 52+ Years old  Completed   INFLUENZA VACCINE  Completed   Hepatitis C Screening  Completed   Zoster Vaccines- Shingrix  Completed   HPV VACCINES  Aged Out   Colonoscopy  Discontinued    Health Maintenance  Health Maintenance Due  Topic Date Due   COVID-19 Vaccine (6 - 2023-24 season) 05/24/2023    Colorectal cancer screening: No longer required.    Additional Screening:  Hepatitis C Screening:  Completed 11/12/15  Vision Screening: Recommended annual  ophthalmology exams for early detection of glaucoma and other disorders of the eye. Is the patient up to date with their annual eye exam?  Yes  Who is the provider or what is the name of the office in which the patient attends annual eye exams? Dr Cathey Endow  If pt is not established with a provider, would they like to be referred to a provider to establish care? No .   Dental Screening: Recommended annual dental exams for proper oral hygiene   Community Resource Referral / Chronic Care Management: CRR required this visit?  No   CCM required this visit?  No     Plan:     I have personally reviewed and noted the following in the patient's chart:   Medical and social history Use of alcohol, tobacco or illicit drugs  Current medications and supplements including opioid prescriptions. Patient is not currently taking opioid prescriptions. Functional ability and status Nutritional status Physical activity Advanced directives List of other physicians Hospitalizations, surgeries, and ER visits in previous 12 months Vitals Screenings to include cognitive, depression, and falls Referrals and appointments  In addition, I have reviewed and discussed with patient certain preventive protocols, quality metrics, and best practice recommendations. A written personalized care plan for preventive services as well as general preventive health recommendations were provided to patient.     Marzella Schlein, LPN   [id number]/06/[id number]   After Visit Summary: (MyChart) Due to this being a telephonic visit, the after visit summary with patients personalized plan was offered to patient via MyChart   Nurse Notes: none

## 2023-07-27 ENCOUNTER — Encounter: Payer: Self-pay | Admitting: Physician Assistant

## 2023-07-27 ENCOUNTER — Ambulatory Visit: Payer: Medicare Other | Admitting: Physician Assistant

## 2023-07-27 VITALS — BP 130/80 | HR 54 | Temp 98.2°F | Ht 69.5 in | Wt 165.0 lb

## 2023-07-27 DIAGNOSIS — R413 Other amnesia: Secondary | ICD-10-CM | POA: Diagnosis not present

## 2023-07-27 DIAGNOSIS — Z8546 Personal history of malignant neoplasm of prostate: Secondary | ICD-10-CM | POA: Diagnosis not present

## 2023-07-27 DIAGNOSIS — H539 Unspecified visual disturbance: Secondary | ICD-10-CM

## 2023-07-27 DIAGNOSIS — E7800 Pure hypercholesterolemia, unspecified: Secondary | ICD-10-CM | POA: Diagnosis not present

## 2023-07-27 LAB — CBC WITH DIFFERENTIAL/PLATELET
Basophils Absolute: 0.1 10*3/uL (ref 0.0–0.1)
Basophils Relative: 1.7 % (ref 0.0–3.0)
Eosinophils Absolute: 0.1 10*3/uL (ref 0.0–0.7)
Eosinophils Relative: 1.8 % (ref 0.0–5.0)
HCT: 42.7 % (ref 39.0–52.0)
Hemoglobin: 13.8 g/dL (ref 13.0–17.0)
Lymphocytes Relative: 22 % (ref 12.0–46.0)
Lymphs Abs: 1 10*3/uL (ref 0.7–4.0)
MCHC: 32.4 g/dL (ref 30.0–36.0)
MCV: 90.1 fL (ref 78.0–100.0)
Monocytes Absolute: 0.5 10*3/uL (ref 0.1–1.0)
Monocytes Relative: 10.9 % (ref 3.0–12.0)
Neutro Abs: 3 10*3/uL (ref 1.4–7.7)
Neutrophils Relative %: 63.6 % (ref 43.0–77.0)
Platelets: 161 10*3/uL (ref 150.0–400.0)
RBC: 4.74 Mil/uL (ref 4.22–5.81)
RDW: 14.7 % (ref 11.5–15.5)
WBC: 4.8 10*3/uL (ref 4.0–10.5)

## 2023-07-27 LAB — LIPID PANEL
Cholesterol: 179 mg/dL (ref 0–200)
HDL: 56.3 mg/dL (ref >39.00)
LDL Cholesterol: 104 mg/dL — ABNORMAL HIGH (ref 0–99)
NonHDL: 122.57
Total CHOL/HDL Ratio: 3
Triglycerides: 92 mg/dL (ref 0.0–149.0)
VLDL: 18.4 mg/dL (ref 0.0–40.0)

## 2023-07-27 LAB — COMPREHENSIVE METABOLIC PANEL
ALT: 21 U/L (ref 0–53)
AST: 27 U/L (ref 0–37)
Albumin: 4.3 g/dL (ref 3.5–5.2)
Alkaline Phosphatase: 58 U/L (ref 39–117)
BUN: 24 mg/dL — ABNORMAL HIGH (ref 6–23)
CO2: 31 meq/L (ref 19–32)
Calcium: 9.3 mg/dL (ref 8.4–10.5)
Chloride: 103 meq/L (ref 96–112)
Creatinine, Ser: 1.23 mg/dL (ref 0.40–1.50)
GFR: 55.66 mL/min — ABNORMAL LOW (ref >60.00)
Glucose, Bld: 86 mg/dL (ref 70–99)
Potassium: 4.6 meq/L (ref 3.5–5.1)
Sodium: 141 meq/L (ref 135–145)
Total Bilirubin: 0.5 mg/dL (ref 0.2–1.2)
Total Protein: 6.8 g/dL (ref 6.0–8.3)

## 2023-07-27 LAB — TSH: TSH: 3.15 u[IU]/mL (ref 0.35–5.50)

## 2023-07-27 LAB — VITAMIN B12: Vitamin B-12: 1279 pg/mL — ABNORMAL HIGH (ref 211–911)

## 2023-07-27 LAB — PSA: PSA: 0.01 ng/mL — ABNORMAL LOW (ref 0.10–4.00)

## 2023-07-27 NOTE — Progress Notes (Signed)
Subjective:    Harold Leach is a 82 y.o. male and is here for a comprehensive physical exam.  HPI  There are no preventive care reminders to display for this patient.  Accompanied by his Wife   Acute Concerns: Memory Loss  He states that his memory has been worsening.  His wife states that she often has to direct him when driving. He states that he often misplaces certain items around the house. He denies having difficulty recalling words when having a conversation. He does reports having difficulty recalling distant events and certain names.  He reports having a maternal history of dementia. He denies any sleeping issues, anger, anxiety, or depression. He also denies any confusion at the end of the day.   He is unsure if he has had a previous concussion.   Blurred Vision He reports having an episode where he lost vision in one eye.  His eye turned opaque and milky.  His vision was back to normal after 20 minutes. He went to his ophthalmologist and had an echocardiogram and carotid ultrasound performed.  He was told that these results were normal. He reports compliance with Aspirin 81 mg.  He denies any cholesterol medications   Chronic Issues: Insomnia  He reports compliance and good tolerance of  Remeron 30 mg, trazodone 50 mg and melatonin 10 mg Denies any complains at this time.   GERD  He reports compliance and good tolerance of protonix 40 mg. Denies any symptoms at this time.  Cardiac History He is currently 1 year s/p TAVR. Denies any symptoms at this time.  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   Health Maintenance: Immunizations -- n/a Colonoscopy -- n/a PSA --  Lab Results  Component Value Date   PSA 0.00 (L) 07/25/2022   PSA 0.00 (L) 08/02/2021   PSA <0.04 07/23/2020   Diet -- healthy diet Sleep habits -- no complains Exercise -- normal exercise levels  Weight --  Recent weight history Wt Readings from Last 10  Encounters:  07/27/23 165 lb (74.8 kg)  07/14/23 160 lb (72.6 kg)  07/03/23 160 lb 3.2 oz (72.7 kg)  10/03/22 162 lb 9.6 oz (73.8 kg)  08/22/22 158 lb 9.6 oz (71.9 kg)  08/06/22 162 lb 3.2 oz (73.6 kg)  07/25/22 159 lb 4 oz (72.2 kg)  07/23/22 153 lb 4.8 oz (69.5 kg)  07/10/22 154 lb (69.9 kg)  06/11/22 154 lb 12.8 oz (70.2 kg)   Body mass index is 24.02 kg/m.  Mood -- stable.  Alcohol use --  reports no history of alcohol use.  Tobacco use --  Tobacco Use: Medium Risk (07/27/2023)   Patient History    Smoking Tobacco Use: Former    Smokeless Tobacco Use: Never    Passive Exposure: Not on file    Eligible for Low Dose CT? no  UTD with eye doctor? yes UTD with dentist? yes     07/27/2023    9:59 AM  Depression screen PHQ 2/9  Decreased Interest 0  Down, Depressed, Hopeless 0  PHQ - 2 Score 0    Other providers/specialists: Patient Care Team: Jarold Motto, [place] as PCP - General (Physician Assistant) Rollene Rotunda, MD as PCP - Cardiology (Cardiology) Heloise Purpura, MD as Consulting Physician (Urology)    PMHx, SurgHx, SocialHx, Medications, and Allergies were reviewed in the Visit Navigator and updated as appropriate.   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 Lynnette Caffey and Dr. Delia Chimes.     Past Surgical History:  Procedure Laterality Date   ABDOMINAL AORTOGRAM N/A 06/06/2022   Procedure: ABDOMINAL AORTOGRAM;  Surgeon: Orbie Pyo, MD;  Location: [facility];  Service: Cardiovascular;  Laterality: N/A;   BALLOON  AORTIC VALVE VALVULOPLASTY  07/22/2022   Procedure: BALLOON AORTIC VALVE VALVULOPLASTY;  Surgeon: Orbie Pyo, MD;  Location: [facility] OR;  Service: Open Heart Surgery;;   HERNIA REPAIR     INTRAOPERATIVE TRANSTHORACIC ECHOCARDIOGRAM N/A 07/22/2022   Procedure: INTRAOPERATIVE TRANSTHORACIC ECHOCARDIOGRAM;  Surgeon: Orbie Pyo, MD;  Location: [facility] OR;  Service: Open Heart Surgery;  Laterality: N/A;   PROSTATE SURGERY  2012   RIGHT/LEFT HEART CATH AND CORONARY ANGIOGRAPHY N/A 06/06/2022   Procedure: RIGHT/LEFT HEART CATH AND CORONARY ANGIOGRAPHY;  Surgeon: Orbie Pyo, MD;  Location: [facility];  Service: Cardiovascular;  Laterality: N/A;   TRANSCATHETER AORTIC VALVE REPLACEMENT, TRANSFEMORAL N/A 07/22/2022   Procedure: Transcatheter Aortic Valve Replacement, Transfemoral;  Surgeon: Orbie Pyo, MD;  Location: [facility] OR;  Service: Open Heart Surgery;  Laterality: N/A;   ULTRASOUND GUIDANCE FOR VASCULAR ACCESS N/A 07/22/2022   Procedure: ULTRASOUND GUIDANCE FOR VASCULAR ACCESS;  Surgeon: Orbie Pyo, MD;  Location: [facility] OR;  Service: Open Heart Surgery;  Laterality: N/A;     Family History  Problem Relation Age of Onset   Heart disease Mother        CHF   Hypertension Mother    Dementia Mother        concussion x 2   Prostate cancer Father    Valvular heart disease Brother    Heart disease Maternal Grandmother    Stroke Maternal Grandmother     Social History   Tobacco Use   Smoking status: Former    Types: Pipe    Quit date: 03/20/1981    Years since quitting: 42.3   Smokeless tobacco: Never   Tobacco comments:    Quit in 1983 pipe smoker   Substance Use Topics   Alcohol use: No   Drug use: No    Review of Systems:   Review of Systems  Constitutional:  Negative for chills, fever, malaise/fatigue and weight loss.  HENT:  Negative for hearing loss, sinus pain and sore throat.   Eyes:  Positive for blurred vision.  Respiratory:  Negative for cough and  hemoptysis.   Cardiovascular:  Negative for chest pain, palpitations, leg swelling and PND.  Gastrointestinal:  Negative for abdominal pain, constipation, diarrhea, heartburn, nausea and vomiting.  Genitourinary:  Negative for dysuria, frequency and urgency.  Musculoskeletal:  Positive for back pain. Negative for myalgias and neck pain.  Skin:  Negative for itching and rash.  Neurological:  Negative for dizziness, tingling, seizures and headaches.  Endo/Heme/Allergies:  Negative for polydipsia.  Psychiatric/Behavioral:  Positive for memory loss. Negative for depression. The patient is not nervous/anxious.     Objective:    Vitals:   07/27/23 0958  BP: 130/80  Pulse: (!) 54  Temp: 98.2 F (36.8 C)  SpO2: 97%    Body mass index is  24.02 kg/m.  General  Alert, cooperative, no distress, appears stated age  Head:  Normocephalic, without obvious abnormality, atraumatic  Eyes:  PERRL, conjunctiva/corneas clear, EOM's intact, fundi benign, both eyes       Ears:  Normal TM's and external ear canals, both ears  Nose: Nares normal, septum midline, mucosa normal, no drainage or sinus tenderness  Throat: Lips, mucosa, and tongue normal; teeth and gums normal  Neck: Supple, symmetrical, trachea midline, no adenopathy;     thyroid:  No enlargement/tenderness/nodules; no carotid bruit or JVD  Back:   Symmetric, no curvature, ROM normal, no CVA tenderness  Lungs:   Clear to auscultation bilaterally, respirations unlabored  Chest wall:  No tenderness or deformity  Heart:  Regular rate and rhythm, S1 and S2 normal, no murmur, rub or gallop  Abdomen:   Soft, non-tender, bowel sounds active all four quadrants, no masses, no organomegaly  Extremities: Extremities normal, atraumatic, no cyanosis or edema  Prostate : Not done   Skin: Skin color, texture, turgor normal, no rashes or lesions  Lymph nodes: Cervical, supraclavicular, and axillary nodes normal  Neurologic: CNII-XII grossly intact.  Normal strength, sensation and reflexes throughout   AssessmentPlan:   Pure hypercholesterolemia Update lipid panel I am concerned that he had possible TIA with his recent vision issue based on what he described that his eye doctor discussed with him -- recommend LDL < 70  Will update arteriosclerotic cardiovascular disease calculation and provide further recommendations accordingly Consider crestor 5 mg  Memory deficits Update blood work Referral to neurology  History of prostate cancer Update PSA  Vision changes I am concerned that he had possible TIA with his recent vision issue based on what he described with the eye doctor -- recommend LDL < 70  Discussed getting MRI brain but he declined Consider heart rate monitor to ensure no underlying atrial fibrillation or other concerning arrhythmias  Will update arteriosclerotic cardiovascular disease calculation and provide further recommendations accordingly Low threshold to go to the ER if symptom(s) return   Jarold Motto, PA-C [facility] M Engineer, manufacturing systems as a Neurosurgeon for [employer], PA.,have documented all relevant documentation on the behalf of Jarold Motto, PA,as directed by  Jarold Motto, PA while in the presence of Jarold Motto, [place].   I, Jarold Motto, [place], have reviewed all documentation for this visit. The documentation on 07/27/23 for the exam, diagnosis, procedures, and orders are all accurate and complete.   I spent a total of 65 minutes on this visit, today 07/27/23, which included reviewing previous notes from cardiology, ordering tests, discussing plan of care with patient and using shared-decision making on next steps, refilling medications, and documenting the findings in the note.

## 2023-07-27 NOTE — Patient Instructions (Addendum)
It was great to see you!  We will place referral for neurology If you lose vision in your eye again - go to the ER ASAP  Please go to the lab for blood work.   Our office will call you with your results unless you have chosen to receive results via MyChart.  If your blood work is normal we will follow-up each year for physicals and as scheduled for chronic medical problems.  If anything is abnormal we will treat accordingly and get you in for a follow-up.  Take care,  Lelon Mast

## 2023-07-28 ENCOUNTER — Other Ambulatory Visit: Payer: Self-pay | Admitting: *Deleted

## 2023-07-28 ENCOUNTER — Other Ambulatory Visit: Payer: Self-pay | Admitting: Physician Assistant

## 2023-07-28 ENCOUNTER — Ambulatory Visit: Payer: Medicare Other | Attending: Physician Assistant

## 2023-07-28 DIAGNOSIS — G459 Transient cerebral ischemic attack, unspecified: Secondary | ICD-10-CM

## 2023-07-28 MED ORDER — ROSUVASTATIN CALCIUM 5 MG PO TABS: 5.0000 mg | ORAL_TABLET | Freq: Every day | ORAL | 3 refills | Status: AC

## 2023-07-28 NOTE — Progress Notes (Unsigned)
EP to read

## 2023-07-29 ENCOUNTER — Other Ambulatory Visit: Payer: Self-pay | Admitting: Family

## 2023-07-30 DIAGNOSIS — G459 Transient cerebral ischemic attack, unspecified: Secondary | ICD-10-CM | POA: Diagnosis not present

## 2023-08-05 NOTE — Telephone Encounter (Signed)
Pt requesting refill for Trazodone 150 mg. Historical provider.

## 2023-08-05 NOTE — Telephone Encounter (Signed)
Patient's wife called requesting refill from PCP.

## 2023-08-19 DIAGNOSIS — G459 Transient cerebral ischemic attack, unspecified: Secondary | ICD-10-CM | POA: Diagnosis not present

## 2023-08-24 ENCOUNTER — Telehealth: Payer: Self-pay | Admitting: Physician Assistant

## 2023-08-24 NOTE — Telephone Encounter (Signed)
Pt states he was returning Donna's call.

## 2023-08-24 NOTE — Telephone Encounter (Signed)
See result notes. 

## 2023-08-25 ENCOUNTER — Encounter: Payer: Self-pay | Admitting: Physician Assistant

## 2023-08-25 ENCOUNTER — Other Ambulatory Visit: Payer: Self-pay | Admitting: *Deleted

## 2023-08-25 DIAGNOSIS — R413 Other amnesia: Secondary | ICD-10-CM

## 2023-09-09 ENCOUNTER — Other Ambulatory Visit: Payer: Self-pay | Admitting: Physician Assistant

## 2023-09-15 NOTE — Progress Notes (Signed)
Assessment/Plan:     Harold Leach is a very pleasant 81 y.o. year old RH male with a history of hypertension, hyperlipidemia, remote history of prostate cancer, GERD, bradycardia, CAD status post TAVR, chronic insomnia, macular degeneration,  recent diagnosis of Afib, questionable history of TIA in 04/2023  seen today for evaluation of memory loss. MoCA today is 20/30. 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  Baby aspirin daily Recommend no further chiropractor manipulation tot the neck  Continue to control mood as per PCP Fol[place] up in 3 months  Subjective:    The patient is accompanied by his wife  who supplements the history.    How long did patient have memory difficulties? " I never had a memory for my whole life, but worse over the last 2-3 years". Patient reports some difficulty remembering new information, recent conversations, names (especially the grandchildren).  He has trouble recalling some words during the conversation.  His long-term memory is also affected, at times having trouble remembering past events. repeats oneself?  Endorsed Disoriented when walking into a room?  Patient denies    Leaving objects in unusual places?  Endorsed by his wife.  Wandering behavior? Denies.    Any personality changes, or depression, anxiety? Denies  Hallucinations or paranoia? Denies.   Seizures? denies    Any sleep changes?  Sleeps well with Remeron and trazodone as well as melatonin, denies vivid dreams, REM behavior or sleepwalking   Sleep apnea? Denies.   Any hygiene concerns?  Denies.   Independent of bathing and dressing? Endorsed  Does the patient need help with medications? Wife is in charge  Who is in charge of the finances? Both of them  in charge     Any changes in appetite?   Denies.     Patient have trouble swallowing?  Denies.   Does the patient cook? Never did  Any headaches?  Denies.   Chronic pain?  Chronic back issues uses a chiropractor, "he also fixes my neck"-he says, last visit 2 weeks ago  Ambulates with difficulty? Denies  Recent falls or head injuries?  In the remote past he may have had a concussion but he cannot give details.     Vision changes?  He has a history of glaucoma.  In the recent past, he had what it appears to be described as amaurosis fugax, and this is being evaluated by [facility], ophthalmology, and cardiology.  Per his report, the results of all the studies were normal, including carotid ultrasound and echo. Any strokelike symptoms? this summer went to [facility] and had  an episode of  R eye blurriness, ?amaurosis fugax/TIA, with vision returning after 20 mins without other symptoms and without recurrence   Any tremors? Denies.  Any anosmia? Denies.   Any incontinence of urine? Denies.   Any bowel dysfunction? Denies.  Patient lives with his wife   History of heavy alcohol intake? Denies.   History of heavy tobacco use? Denies.   Family history of dementia?  Mother with dementia  Does patient drive?  Wife reports that he she has to direct him with driving more often than before.   Recent labs November 2024 LDL 104, B12 1279, normal CBC with differential, TSH 3.15.   No Known Allergies  Current Outpatient Medications   Medication Instructions   amoxicillin (AMOXIL) 500 MG tablet Take 4 tablets by mouth one hour prior to dental appointments.   aspirin EC 81 mg, Daily   Cholecalciferol (D3-1000 PO) 1,000 Units, Every other day   CINNAMON PO 1,200 mg, Daily   clobetasol cream (TEMOVATE) 0.05 % APPLY TOPICALLY TO THE AFFECTED AREA TWICE DAILY   cyanocobalamin (VITAMIN B12) 1,000 mcg, Every other day   Ginkgo Biloba 60 mg, Daily   magnesium oxide (MAG-OX) 400 mg, Daily   Melatonin 10 mg, Daily at bedtime   memantine (NAMENDA) 5 MG tablet Take 1 tablet (5 mg at night) for 2 weeks, then increase to 1 tablet (5 mg) twice a day   mirtazapine (REMERON) 30 MG tablet TAKE 1 TO 1 AND 1/2 TABLETS BY MOUTH AT NIGHT   Multiple Vitamins-Minerals (PRESERVISION AREDS 2+MULTI VIT PO) 2 capsules, Daily   pantoprazole (PROTONIX) 40 MG tablet TAKE 1 TABLET(40 MG) BY MOUTH DAILY   Polyethyl Glycol-Propyl Glycol (SYSTANE OP) 1 drop, Daily PRN   Probiotic Product (ALIGN DUALBIOTIC PO) Take by mouth.   rosuvastatin (CRESTOR) 5 mg, Oral, Daily   traZODone (DESYREL) 150 MG tablet TAKE 1 TABLET(150 MG) BY MOUTH AT BEDTIME AS NEEDED FOR SLEEP   Turmeric (QC TUMERIC COMPLEX PO) 1,000 mg, Daily     VITALS:   Vitals:   09/18/23 0941 09/18/23 1028  BP: (!) 169/146 (!) 141/74  Pulse: (!) 59   SpO2: 98%   Weight: 163 lb (73.9 kg)   Height: 5' 9.5" (1.765 m)       PHYSICAL EXAM   HEENT:  Normocephalic, atraumatic.  The superficial temporal arteries are without ropiness or tenderness. Cardiovascular: Regular rate and rhythm. Lungs: Clear to auscultation bilaterally. Neck: There are no carotid bruits noted bilaterally.  NEUROLOGICAL:    09/18/2023    9:00 AM  Montreal Cognitive Assessment   Visuospatial/ Executive (0/5) 2  Naming (0/3) 2  Attention: Read list of digits (0/2) 2  Attention: Read list of letters (0/1) 1  Attention: Serial 7 subtraction starting at 100 (0/3) 3  Language: Repeat phrase (0/2) 2  Language :  Fluency (0/1) 0  Abstraction (0/2) 1  Delayed Recall (0/5) 0  Orientation (0/6) 6  Total 19  Adjusted Score (based on education) 20       01/02/2017    9:19 AM  MMSE - Mini Mental State Exam  Orientation to time 5  Orientation to Place 5  Registration 3  Attention/ Calculation 5  Recall 3  Language- name 2 objects 2  Language- repeat 1  Language- follow 3 step command 3  Language- read & follow direction 1  Write a sentence 1  Copy design 1  Total score 30     Orientation:  Alert and oriented to person, place and  time . No aphasia or dysarthria. Fund of knowledge is appropriate. Recent  and remote memory impaired.  Attention and concentration are reduced.  Able to name objects and repeat phrases   Delayed recall  0/5 Cranial nerves: There  is good facial symmetry. Extraocular muscles are intact and visual fields are full to confrontational testing. Speech is fluent and clear. No tongue deviation. Hearing is intact to conversational tone.  Tone: Tone is good throughout. Sensation: Sensation is intact to light touch.  Vibration is intact at the bilateral big toe.  Coordination: The patient has no difficulty with RAM's or FNF bilaterally. Normal finger to nose  Motor: Strength is 5/5 in the bilateral upper and lower extremities. There is no pronator drift. There are no fasciculations noted. DTR's: Deep tendon reflexes are 2/4 bilaterally. Gait and Station: The patient is able to ambulate without difficulty. Gait is cautious and narrow. Stride length is normal        Thank you for allowing [place] the opportunity to participate in the care of this nice patient. Please do not hesitate to contact us for any questions or concerns.   Total time spent on today's visit was 60 minutes dedicated to this patient today, preparing to see patient, examining the patient, ordering tests and/or medications and counseling the patient, documenting clinical information in the EHR or other health record,  independently interpreting results and communicating results to the patient/family, discussing treatment and goals, answering patient's questions and coordinating care.  Cc:  Jarold Motto, PA  Marlowe Kays 09/18/2023 12:08 PM

## 2023-09-18 ENCOUNTER — Encounter: Payer: Self-pay | Admitting: Physician Assistant

## 2023-09-18 ENCOUNTER — Ambulatory Visit (INDEPENDENT_AMBULATORY_CARE_PROVIDER_SITE_OTHER): Payer: Medicare Other | Admitting: Physician Assistant

## 2023-09-18 ENCOUNTER — Ambulatory Visit: Payer: Medicare Other

## 2023-09-18 VITALS — BP 141/74 | HR 59 | Ht 69.5 in | Wt 163.0 lb

## 2023-09-18 DIAGNOSIS — R413 Other amnesia: Secondary | ICD-10-CM

## 2023-09-18 MED ORDER — MEMANTINE HCL 5 MG PO TABS: ORAL_TABLET | ORAL | 11 refills | Status: DC

## 2023-09-18 NOTE — Patient Instructions (Addendum)
It was a pleasure to see you today at our office.   Recommendations:  Neurocognitive evaluation at our office.   MRI of the brain, the radiology office will call you to arrange you appointment    Start memantine  5 mg , Take 1 tablet (5 mg at night) for 2 weeks, then increase to 1 tablet (5 mg) twice a day   Follow up in 3 months     For psychiatric meds, mood meds: Please have your primary care physician manage these medications.  If you have any severe symptoms of a stroke, or other severe issues such as confusion,severe chills or fever, etc call 911 or go to the ER as you may need to be evaluated further    For assessment of decision of mental capacity and competency:  Call Dr. Erick Blinks, geriatric psychiatrist at [phone]  Counseling regarding caregiver distress, including caregiver depression, anxiety and issues regarding community resources, adult day care programs, adult living facilities, or memory care questions:  please contact your  Primary Doctor's Social Worker   Whom to call: Memory  decline, memory medications: Call our office [phone]    [email or web address]   RECOMMENDATIONS FOR ALL PATIENTS WITH MEMORY PROBLEMS: 1. Continue to exercise (Recommend 30 minutes of walking everyday, or 3 hours every week) 2. Increase social interactions - continue going to [place] and enjoy social gatherings with friends and family 3. Eat healthy, avoid fried foods and eat more fruits and vegetables 4. Maintain adequate blood pressure, blood sugar, and blood cholesterol level. Reducing the risk of stroke and cardiovascular disease also helps promoting better memory. 5. Avoid stressful situations. Live a simple life and avoid aggravations. Organize your time and prepare for the next day in anticipation. 6. Sleep well, avoid any interruptions of sleep and avoid any distractions in the bedroom that may interfere with adequate  sleep quality 7. Avoid sugar, avoid sweets as there is a strong link between excessive sugar intake, diabetes, and cognitive impairment We discussed the Mediterranean diet, which has been shown to help patients reduce the risk of progressive memory disorders and reduces cardiovascular risk. This includes eating fish, eat fruits and green leafy vegetables, nuts like almonds and hazelnuts, walnuts, and also use olive oil. Avoid fast foods and fried foods as much as possible. Avoid sweets and sugar as sugar use has been linked to worsening of memory function.  There is always a concern of gradual progression of memory problems. If this is the case, then we may need to adjust level of care according to patient needs. Support, both to the patient and caregiver, should then be put into place.      You have been referred for a neuropsychological evaluation (i.e., evaluation of memory and thinking abilities). Please bring someone with you to this appointment if possible, as it is helpful for the doctor to hear from both you and another adult who knows you well. Please bring eyeglasses and hearing aids if you wear them.    The evaluation will take approximately 3 hours and has two parts:   The first part is a clinical interview with the neuropsychologist (Dr. Milbert Coulter or Dr. Roseanne Reno). During the interview, the neuropsychologist will speak with you and the individual you brought to the appointment.    The second part of the evaluation is testing with the doctor's technician Annabelle Harman or Selena Batten). During the testing, the technician will ask you to remember different types of material, solve problems, and answer some questionnaires. Your family  member will not be present for this portion of the evaluation.   Please note: We must reserve several hours of the neuropsychologist's time and the psychometrician's time for your evaluation appointment. As such, there is a No-Show fee of $100. If you are unable to attend any of  your appointments, please contact our office as soon as possible to reschedule.      DRIVING: Regarding driving, in patients with progressive memory problems, driving will be impaired. We advise to have someone else do the driving if trouble finding directions or if minor accidents are reported. Independent driving assessment is available to determine safety of driving.   If you are interested in the driving assessment, you can contact the following:  [facility] in [place] [phone]  [facility] [phone]  [facility] [phone]  [facility] [phone] or [phone]   FALL PRECAUTIONS: Be cautious when walking. Scan the area for obstacles that may increase the risk of trips and falls. When getting up in the mornings, sit up at the edge of the bed for a few minutes before getting out of bed. Consider elevating the bed at the head end to avoid drop of blood pressure when getting up. Walk always in a well-lit room (use night lights in the walls). Avoid area rugs or power cords from appliances in the middle of the walkways. Use a walker or a cane if necessary and consider physical therapy for balance exercise. Get your eyesight checked regularly.  FINANCIAL OVERSIGHT: Supervision, especially oversight when making financial decisions or transactions is also recommended.  HOME SAFETY: Consider the safety of the kitchen when operating appliances like stoves, microwave oven, and blender. Consider having supervision and share cooking responsibilities until no longer able to participate in those. Accidents with firearms and other hazards in the house should be identified and addressed as well.   ABILITY TO BE LEFT ALONE: If patient is unable to contact 911 operator, consider using LifeLine, or when the need is there, arrange for someone to stay with patients. Smoking is a fire hazard, consider supervision or cessation. Risk of wandering  should be assessed by caregiver and if detected at any point, supervision and safe proof recommendations should be instituted.  MEDICATION SUPERVISION: Inability to self-administer medication needs to be constantly addressed. Implement a mechanism to ensure safe administration of the medications.      Mediterranean Diet A Mediterranean diet refers to food and lifestyle choices that are based on the traditions of countries located on the [employer]. This way of eating has been shown to help prevent certain conditions and improve outcomes for people who have chronic diseases, like kidney disease and heart disease. What are tips for following this plan? Lifestyle  Cook and eat meals together with your family, when possible. Drink enough fluid to keep your urine clear or pale yellow. Be physically active every day. This includes: Aerobic exercise like running or swimming. Leisure activities like gardening, walking, or housework. Get 7-8 hours of sleep each night. If recommended by your health care provider, drink red wine in moderation. This means 1 glass a day for nonpregnant women and 2 glasses a day for men. A glass of wine equals 5 oz (150 mL). Reading food labels  Check the serving size of packaged foods. For foods such as rice and pasta, the serving size refers to the amount of cooked product, not dry. Check the total fat in packaged foods. Avoid foods that have saturated fat or trans  fats. Check the ingredients list for added sugars, such as corn syrup. Shopping  At the grocery store, buy most of your food from the areas near the walls of the store. This includes: Fresh fruits and vegetables (produce). Grains, beans, nuts, and seeds. Some of these may be available in unpackaged forms or large amounts (in bulk). Fresh seafood. Poultry and eggs. Low-fat dairy products. Buy whole ingredients instead of prepackaged foods. Buy fresh fruits and vegetables in-season from local  farmers markets. Buy frozen fruits and vegetables in resealable bags. If you do not have access to quality fresh seafood, buy precooked frozen shrimp or canned fish, such as tuna, salmon, or sardines. Buy small amounts of raw or cooked vegetables, salads, or olives from the deli or salad bar at your store. Stock your pantry so you always have certain foods on hand, such as olive oil, canned tuna, canned tomatoes, rice, pasta, and beans. Cooking  Cook foods with extra-virgin olive oil instead of using butter or other vegetable oils. Have meat as a side dish, and have vegetables or grains as your main dish. This means having meat in small portions or adding small amounts of meat to foods like pasta or stew. Use beans or vegetables instead of meat in common dishes like chili or lasagna. Experiment with different cooking methods. Try roasting or broiling vegetables instead of steaming or sauteing them. Add frozen vegetables to soups, stews, pasta, or rice. Add nuts or seeds for added healthy fat at each meal. You can add these to yogurt, salads, or vegetable dishes. Marinate fish or vegetables using olive oil, lemon juice, garlic, and fresh herbs. Meal planning  Plan to eat 1 vegetarian meal one day each week. Try to work up to 2 vegetarian meals, if possible. Eat seafood 2 or more times a week. Have healthy snacks readily available, such as: Vegetable sticks with hummus. Greek yogurt. Fruit and nut trail mix. Eat balanced meals throughout the week. This includes: Fruit: 2-3 servings a day Vegetables: 4-5 servings a day Low-fat dairy: 2 servings a day Fish, poultry, or lean meat: 1 serving a day Beans and legumes: 2 or more servings a week Nuts and seeds: 1-2 servings a day Whole grains: 6-8 servings a day Extra-virgin olive oil: 3-4 servings a day Limit red meat and sweets to only a few servings a month What are my food choices? Mediterranean diet Recommended Grains: Whole-grain pasta.  Brown rice. Bulgar wheat. Polenta. Couscous. Whole-wheat bread. Orpah Cobb. Vegetables: Artichokes. Beets. Broccoli. Cabbage. Carrots. Eggplant. Green beans. Chard. Kale. Spinach. Onions. Leeks. Peas. Squash. Tomatoes. Peppers. Radishes. Fruits: Apples. Apricots. Avocado. Berries. Bananas. Cherries. Dates. Figs. Grapes. Lemons. Melon. Oranges. Peaches. Plums. Pomegranate. Meats and other protein foods: Beans. Almonds. Sunflower seeds. Pine nuts. Peanuts. Cod. Salmon. Scallops. Shrimp. Tuna. Tilapia. Clams. Oysters. Eggs. Dairy: Low-fat milk. Cheese. Greek yogurt. Beverages: Water. Red wine. Herbal tea. Fats and oils: Extra virgin olive oil. Avocado oil. Grape seed oil. Sweets and desserts: [place] yogurt with honey. Baked apples. Poached pears. Trail mix. Seasoning and other foods: Basil. Cilantro. Coriander. Cumin. Mint. Parsley. Sage. Rosemary. Tarragon. Garlic. Oregano. Thyme. Pepper. Balsalmic vinegar. Tahini. Hummus. Tomato sauce. Olives. Mushrooms. Limit these Grains: Prepackaged pasta or rice dishes. Prepackaged cereal with added sugar. Vegetables: Deep fried potatoes (french fries). Fruits: Fruit canned in syrup. Meats and other protein foods: Beef. Pork. Lamb. Poultry with skin. Hot dogs. Tomasa Blase. Dairy: Ice cream. Sour cream. Whole milk. Beverages: Juice. Sugar-sweetened soft drinks. Beer. Liquor and spirits. Fats and  oils: Butter. Canola oil. Vegetable oil. Beef fat (tallow). Lard. Sweets and desserts: Cookies. Cakes. Pies. Candy. Seasoning and other foods: Mayonnaise. Premade sauces and marinades. The items listed may not be a complete list. Talk with your dietitian about what dietary choices are right for you. Summary The Mediterranean diet includes both food and lifestyle choices. Eat a variety of fresh fruits and vegetables, beans, nuts, seeds, and whole grains. Limit the amount of red meat and sweets that you eat. Talk with your health care provider about whether it is safe  for you to drink red wine in moderation. This means 1 glass a day for nonpregnant women and 2 glasses a day for men. A glass of wine equals 5 oz (150 mL). This information is not intended to replace advice given to you by your health care provider. Make sure you discuss any questions you have with your health care provider. Document Released: 05/01/2016 Document Revised: 06/03/2016 Document Reviewed: 05/01/2016 Elsevier Interactive Patient Education  2017 [employer].

## 2023-09-26 NOTE — Progress Notes (Signed)
 Cardiology Office Note:   Date:  09/29/2023  ID:  Harold Leach, Harold Leach [birth year], MRN [id number] PCP: Job Lukes, PA  [facility] Providers Cardiologist:  Lynwood Schilling, MD {  History of Present Illness:   Harold Leach is a 83 y.o. male who was referred by Job Lukes, PA for evaluation of chest pain.    In 2021 a POET (Plain Old Exercise Treadmill) was negative for ischemia.  Echo in May demonstrated a normal EF with AS with a mean gradient of 36 mm.  DI 0.17, AVA 0.75 cm2.   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  Most recent echo in October demonstrated an EF of 55% with normally functioning TAVR with a mean gradient of 4 and trivial paravalvular leak.  There is mild mitral regurgitation.  The aorta is 44 mm.  Since I last saw him has had some memory problems.  There is a questionable neurologic event with some visual disturbance.  He reports that he seen ophthalmology.  I do see a neurology note.  I see mention of a carotid ultrasound that was unremarkable.  Echocardiogram demonstrated findings as above.  He wore a monitor that demonstrated no atrial fibrillation.  He had frequent SVT with the longest lasting 45 seconds.  He had nonsustained ventricular tachycardia with the longest being 8 beats.  PVCs were 10.9% of his total beats but he did not have any of this.  He does not have any presyncope or syncope.  He has had no chest pressure, neck or arm discomfort.  He is being treated with Namenda .  He is apparently due to get an MRI  He says he otherwise feels fine.  He is not having any chest pressure, neck or arm discomfort.  He is not having any new shortness of breath, PND or orthopnea.  He had no weight gain or edema.  ROS: As stated in the HPI and negative for all other systems.  Studies Reviewed:    EKG:   EKG Interpretation Date/Time:  Tuesday September 29 2023 14:09:27  EST Ventricular Rate:  61 PR Interval:  190 QRS Duration:  92 QT Interval:  394 QTC Calculation: 396 R Axis:   39  Text Interpretation: Sinus rhythm with occasional Premature ventricular complexes Incomplete right bundle branch block When compared with ECG of 23-Jul-2022 05:04, Premature ventricular complexes are now Present Confirmed by Schilling Lynwood ([id number]) on 09/29/2023 2:50:01 PM    Risk Assessment/Calculations:              Physical Exam:   VS:  BP 118/68   Pulse 61   Ht 5' 9.5 (1.765 m)   Wt 167 lb (75.8 kg)   BMI 24.31 kg/m    Wt Readings from Last 3 Encounters:  09/29/23 167 lb (75.8 kg)  09/18/23 163 lb (73.9 kg)  07/27/23 165 lb (74.8 kg)     GEN: Well nourished, well developed in no acute distress NECK: No JVD; No carotid bruits CARDIAC: RRR, no murmurs, rubs, gallops RESPIRATORY:  Clear to auscultation without rales, wheezing or rhonchi  ABDOMEN: Soft, non-tender, non-distended EXTREMITIES:  No edema; No deformity   ASSESSMENT AND PLAN:   AS:  He is status post TAVR.   He had normal valve function on echo in October.    BRADYCARDIA:   He had no symptomatic bradycardia.  No change in therapy.   ABNORMAL CT:  The aorta was 40 mm.  A questionable nodule was found to be atelectasis.   I will follow-up the aorta size in the future probably with echocardiography.  TIA:    He is to let me know if he has recurrent symptoms or if there are any significant abnormalities on the MRI.  I have a low threshold for loop monitor.  ECTOPY: He has arrhythmia as mentioned above.  However, has had no symptoms related to this.  No change in therapy.  MR: I will follow this clinically.     Follow up with me in March as previously scheduled  Signed, Lynwood Schilling, MD

## 2023-09-29 ENCOUNTER — Encounter: Payer: Self-pay | Admitting: Cardiology

## 2023-09-29 ENCOUNTER — Ambulatory Visit (INDEPENDENT_AMBULATORY_CARE_PROVIDER_SITE_OTHER): Payer: Medicare Other | Admitting: Cardiology

## 2023-09-29 VITALS — BP 118/68 | HR 61 | Ht 69.5 in | Wt 167.0 lb

## 2023-09-29 DIAGNOSIS — R001 Bradycardia, unspecified: Secondary | ICD-10-CM | POA: Diagnosis not present

## 2023-09-29 DIAGNOSIS — R9389 Abnormal findings on diagnostic imaging of other specified body structures: Secondary | ICD-10-CM | POA: Diagnosis not present

## 2023-09-29 DIAGNOSIS — E785 Hyperlipidemia, unspecified: Secondary | ICD-10-CM

## 2023-09-29 DIAGNOSIS — I350 Nonrheumatic aortic (valve) stenosis: Secondary | ICD-10-CM

## 2023-09-29 NOTE — Patient Instructions (Signed)
 Medication Instructions:  The current medical regimen is effective;  continue present plan and medications.  *If you need a refill on your cardiac medications before your next appointment, please call your pharmacy*  Follow-Up: At [facility], you and your health needs are our priority.  As part of our continuing mission to provide you with exceptional heart care, we have created designated Provider Care Teams.  These Care Teams include your primary Cardiologist (physician) and Advanced Practice Providers (APPs -  Physician Assistants and Nurse Practitioners) who all work together to provide you with the care you need, when you need it.  We recommend signing up for the patient portal called "MyChart".  Sign up information is provided on this After Visit Summary.  MyChart is used to connect with patients for Virtual Visits (Telemedicine).  Patients are able to view lab/test results, encounter notes, upcoming appointments, etc.  Non-urgent messages can be sent to your provider as well.   To learn more about what you can do with MyChart, go to [email or web address].    Your next appointment:   Follow up as scheduled.

## 2023-10-02 ENCOUNTER — Ambulatory Visit
Admission: RE | Admit: 2023-10-02 | Discharge: 2023-10-02 | Disposition: A | Discharge status: Home / Self Care | Payer: Medicare Other | Source: Ambulatory Visit | Attending: Physician Assistant | Admitting: Physician Assistant

## 2023-10-02 DIAGNOSIS — I6782 Cerebral ischemia: Secondary | ICD-10-CM | POA: Diagnosis not present

## 2023-10-02 DIAGNOSIS — R413 Other amnesia: Secondary | ICD-10-CM | POA: Diagnosis not present

## 2023-10-02 DIAGNOSIS — G319 Degenerative disease of nervous system, unspecified: Secondary | ICD-10-CM | POA: Diagnosis not present

## 2023-10-13 ENCOUNTER — Ambulatory Visit: Payer: Medicare Other | Admitting: Cardiology

## 2023-11-06 DIAGNOSIS — Z8546 Personal history of malignant neoplasm of prostate: Secondary | ICD-10-CM | POA: Diagnosis not present

## 2023-11-24 DIAGNOSIS — H524 Presbyopia: Secondary | ICD-10-CM | POA: Diagnosis not present

## 2023-11-24 DIAGNOSIS — H2512 Age-related nuclear cataract, left eye: Secondary | ICD-10-CM | POA: Diagnosis not present

## 2023-11-27 ENCOUNTER — Ambulatory Visit: Payer: Medicare Other | Admitting: Cardiology

## 2023-11-27 ENCOUNTER — Ambulatory Visit (INDEPENDENT_AMBULATORY_CARE_PROVIDER_SITE_OTHER): Payer: Medicare Other | Admitting: Psychology

## 2023-11-27 ENCOUNTER — Ambulatory Visit: Payer: Medicare Other

## 2023-11-27 ENCOUNTER — Encounter: Payer: Self-pay | Admitting: Psychology

## 2023-11-27 DIAGNOSIS — R419 Unspecified symptoms and signs involving cognitive functions and awareness: Secondary | ICD-10-CM

## 2023-11-27 DIAGNOSIS — R4189 Other symptoms and signs involving cognitive functions and awareness: Secondary | ICD-10-CM

## 2023-11-27 NOTE — Progress Notes (Signed)
   Psychometrician Note   Cognitive testing was administered to Harold Leach by Shan Levans, B.S. (psychometrist) under the supervision of Dr. Annice Pih, Psy.D., licensed psychologist on 11/27/2023. Harold Leach did not appear overtly distressed by the testing session per behavioral observation or responses across self-report questionnaires. Rest breaks were offered.    The battery of tests administered was selected by Dr. Annice Pih, Psy.D. with consideration to Harold Leach current level of functioning, the nature of his symptoms, emotional and behavioral responses during interview, level of literacy, observed level of motivation/effort, and the nature of the referral question. This battery was communicated to the psychometrist. Communication between Dr. Annice Pih, Psy.D. and the psychometrist was ongoing throughout the evaluation and Dr. Annice Pih, Psy.D. was immediately accessible at all times. Dr. Annice Pih, Psy.D. provided supervision to the psychometrist on the date of this service to the extent necessary to assure the quality of all services provided.    Harold Leach will return within approximately 1-2 weeks for an interactive feedback session with Dr. Robbie Lis at which time his test performances, clinical impressions, and treatment recommendations will be reviewed in detail. Harold Leach understands he can contact our office should he require our assistance before this time.  A total of 110 minutes of billable time were spent face-to-face with Harold Leach by the psychometrist. This includes both test administration and scoring time. Billing for these services is reflected in the clinical report generated by Dr. Annice Pih, Psy.D.  This note reflects time spent with the psychometrician and does not include test scores or any clinical interpretations made by Dr. Robbie Lis. The full report will follow in a separate note.

## 2023-11-27 NOTE — Progress Notes (Signed)
 NEUROPSYCHOLOGICAL EVALUATION Harold Leach. [facility]  [place] Department of Neurology  Date of Evaluation: 11/27/2023  REASON FOR REFERRAL   Harold Leach is an 83 year old, right-handed, White male with 12 years of formal education. He was referred for neuropsychological evaluation by Harold Kays, PA-C, to assess current neurocognitive functioning, document potential cognitive deficits, and assist with treatment planning. This is his first neuropsychological evaluation.  SUMMARY OF RESULTS   Premorbid cognitive abilities are estimated to be in the average range based on word reading and sociodemographic factors. Considering this estimate, he performed well within, and at times even exceeded, expectations on most tasks. This includes scores on measures of attention, processing speed, executive functioning, language, and visuospatial skills. Memory performance was variable. On the word list task, he demonstrated intact immediate recall and recognition but reduced delayed recall. On the short stories task, he demonstrated intact immediate recall but reduced delayed recall and recognition. While verbal memory was variable, he exhibited intact encoding, recall, and recognition of shapes. On self-report questionnaires, he did not endorse clinically-elevated levels of depression, anxiety, or excessive daytime sleepiness.  DIAGNOSTIC IMPRESSION   Results of the current evaluation indicate a mild weakness in aspects of verbal memory, with otherwise broadly normal test performance, in the setting of preserved functional independence. While there is not enough impairment at this time to warrant a diagnosis of mild cognitive impairment, mild memory weaknesses should be monitored over time to track possible progression. Inefficient retrieval of verbal information, as well as subjective cognitive concerns, may be related to vascular health and resulting vascular changes in the brain. Serial  assessment will be beneficial in clarifying the underlying etiology, monitoring his course, and adapting the treatment plan over time.  ICD-10 Codes: R41.9 Cognitive changes  RECOMMENDATIONS   A repeat neuropsychological evaluation in 12-18 months (or sooner if functional decline is noted) is recommended.  Patient has already been prescribed a medication (i.e., memantine) aimed at addressing memory loss. He is encouraged to continue taking this medication as prescribed. It is important to highlight that this medication has been shown to slow functional decline in some individuals.  Patient is encouraged to continue managing vascular risk factors through a heart healthy diet, physician-approved physical activity, and medication adherence.   He is also encouraged to continue participating in enjoyable, stimulating activities (e.g., working in his shop), including social activities, as remaining cognitively and socially active can improve cognition.  Patient should consider implementing compensatory strategies to maximize independence and maintain daily functioning. Examples include:  -Adhere to routine. Compensatory strategies work best when they are used consistently. Use a planner, calendar, or white board that has the schedule and important events for the day clearly listed to reference and cross off when tasks are complete.  -Ask for written information, especially if it is new or unfamiliar (e.g., information provided at a doctor's appointment).  -Create an organized environment. Keep items that can be easily misplaced in a sensible location and get into the habit of always returning the items to those places.  -Pay attention and reduce distractions. Make a point of focusing attention on information you want to remember. One-on-one interaction is more likely to facilitate attention and minimize distraction. Make eye contact and repeat the information out loud after you hear it. Reduce  interruptions or distractions especially when attempting to learn new information.  -Create associations. When learning something new, think about and understand the information. Explain it in your own words or try to associate it with something  you already know. Take notes to help remember important details. -Evaluate goals and plan accordingly. When confronted by many different tasks, begin by making a list that prioritizes each task and estimates the time it will take to complete. Break down complicated tasks into smaller, more manageable steps.  -Focus on one task at a time and complete each task before starting another. Avoid multitasking.   DISPOSITION   Patient will follow up with the referring provider, Harold Leach. Patient should return for repeat neuropsychological testing in 12-18 months to monitor his course and assist with diagnosis and treatment planning. He and his wife will be provided verbal feedback in approximately one week regarding the findings and impression during this visit.  The remainder of the report includes the details of the patient's background and a table of results from the current evaluation, which support the summary and recommendations described above.  BACKGROUND   History of Presenting Illness: The following information was obtained following a review of medical records and an interview with the patient and his wife, Harold Leach. Patient established care with neurology in December 2024 for memory concerns. Short-term memory difficulties have reportedly been longstanding but now appear to have gradually worsened over the last 2-3 years. MoCA = 20/30.  Cognitive Functioning: During today's appointment, the patient and his wife reported lifelong short-term memory difficulties that have mildly worsened over the past 2-3 years. For example, the patient described that when he was younger, he would watch races on television, but when asked who won, he typically would not  remember. They noted he misplaces items, but this is longstanding. His wife keeps a calendar to remind him of dates, but again, this has been a longstanding practice. They both denied noticing any problems with the patient remembering details of conversations, remembering events, or repeating himself. Patient endorsed relatively slowed processing speed and occasional difficulties with word finding or with remembering names. He otherwise denied concerns with attention, visuospatial skills, and executive functioning. Similarly, his wife denied concerns with attention, visuospatial skills, and executive functioning but further noted that she is not concerned about his processing speed or word-finding abilities, as any observed changes appear typical for his age.  Physical Functioning: Patient denied difficulties with sleep initiation and maintenance when using sleep aids, though he did note poor sleep last night. He averages eight hours of sleep per night. Appetite is stable. No changes to sense of taste or smell were reported. 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 He denied any residual issues with his vision. Despite it being mentioned in his medical record, he denied a history of glaucoma. He wears hearing aids in both ears. He occasionally trips himself up when wearing his work boots, but he denies balance issues and falls. History of tremor was denied.  Emotional Functioning: Patient reported his current mood as "pretty good." He denied suicidal ideation. He enjoys working in his shop and is currently taking pleasure in making cannons. He makes an effort to exercise for 10 minutes every morning, and when the weather is nice, he also walks one mile daily.  Imaging: MRI of the brain (10/02/2023) documented mild generalized parenchymal volume loss, minimal chronic small vessel ischemic  disease, prominent perivascular spaces within the bilateral deep gray nuclei, and a prominent perivascular space versus chronic lacunar infarct within the right caudate head.  Other Relevant Medical History: Remarkable for hyperlipidemia, s/p transcatheter aortic  valve replacement (TAVR), macular degeneration, GERD, erectile dysfunction, and history of prostate cancer s/p prostatectomy (2012). No history of stroke, CNS infection, head injury, or seizure was reported. Medical record indicates a possible history of concussions, but the patient denies sustaining any significant head injuries and has never lost consciousness from hitting his head.  Current Medications, Vitamins, and Supplements: Per record, aspirin, clobetasol cream, ginkgo biloba, magnesium, melatonin, memantine, mirtazapine, multivitamin, pantoprazole, probiotic, rosuvastatin, Systane, trazodone, turmeric, and vitamin D3.  Functional Status: Patient independently performs all ADLs and IADLs without reported difficulty. He continues to drive without accidents or citations. His wife noted that when she is a passenger in the car, the patient asks her for directions, but when driving alone, he is able to navigate independently without getting lost. Regarding finances, the patient manages the investments, while his wife handles everything else. This has always been their arrangement. Regarding medication management, his wife sorts the pills into bottles, but the patient is aware of what he takes and can notice if anything is missing. He also ensured that he does not miss any doses. Patient is able to use tools and appliances without issues.  Family Neurological History: Remarkable for dementia (mother).  Psychiatric History: History of depression, anxiety, prior mental health treatment, suicidal ideation, hallucinations, and psychiatric hospitalizations was not reported. He noted that mirtazapine was prescribed for sleep issues, not mood  concerns.  Substance Use History: Patient denied current use of alcohol, nicotine, marijuana, and illicit substances. He formally smoked pipe tobacco but quit in 1983.  Social and Developmental History: Patient was born in [place], [place]. History of perinatal complications and developmental delays was not reported. He is married and has three daughters, two of whom live locally. He lives with his wife in their private residence.  Educational and Occupational History: No history of childhood learning disability, special education services, or grade retention was reported. Patient reported being a "slow reader" and had difficulty comprehending what he was reading. He also noted that he did not particularly enjoy school. 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  BEHAVIORAL OBSERVATIONS   Patient arrived on time and was accompanied by his wife, Harold Leach. He ambulated independently with a very slight limp. He was alert and fully oriented. He was appropriately groomed and dressed for the setting. No significant sensory or motor abnormalities were observed. Vision (corrected) and hearing (corrected) were adequate for testing purposes. Speech was of normal rate, prosody, and volume. No conversational word-finding difficulties, paraphasic errors, or dysarthria were observed. Comprehension was conversationally intact. Thought processes were linear, logical, and coherent. Thought content was organized and devoid of delusions. Insight appeared intact. Affect was even and congruent with euthymic mood. He was cooperative and gave adequate effort during testing, including on stand-alone and embedded measures of performance validity. Results are  thought to accurately reflect his cognitive functioning at this time.  NEUROPSYCHOLOGICAL TESTING RESULTS   Tests Administered: Animal Naming Test; Beck Anxiety Inventory (BAI); Beck Depression Inventory II (BDI-II); Brief Visuospatial Memory Test-Revised (BVMT-R) - Form 1; Controlled Oral Word Association Test (COWAT): FAS; Delis-Kaplan Executive Function System (D-KEFS) - Subtest(s): Color-Word Interference Test; Epworth Sleepiness Scale (ESS); Hopkins Verbal Learning Test Revised (HVLT-R) -  From 1; Judgment of Line Orientation (JLO) - Form V; Neuropsychological Assessment Battery (NAB) - Subtest(s): Naming Form 1; Standalone performance validity test (PVT); Test of Premorbid Functioning (TOPF); Trail Making Test (TMT); Wechsler Adult Intelligence Scale Fourth Edition (WAIS-IV) - Subtest(s): Clinical cytogeneticist, Matrix Reasoning, Similarities, Digit Span, Symbol Search, Coding; and Wechsler Memory Scale Fourth Edition (WMS-IV) - Subtest(s): Logical Memory (LM).  Test results are provided in the table below. Whenever possible, the patient's scores were compared against age-, sex-, and education-corrected normative samples. Interpretive descriptions are based on the AACN consensus conference statement on uniform labeling (Guilmette et al., 2020).  PREMORBID FUNCTIONING RAW  RANGE  TOPF 26 StdS=90 Average  ATTENTION & WORKING MEMORY RAW  RANGE  WAIS-IV Digit Span -- ss=11 Average  Forward -- ss=12 High Average  Backward -- ss=10 Average  Sequencing -- ss=10 Average  PROCESSING SPEED RAW  RANGE  Trails A 31''0e T=58 High Average  WAIS-IV Symbol Search -- ss=14 High Average  WAIS-IV Coding  -- ss=11 Average  DKEFS CWIT Color Naming 34''0e ss=11 Average  DKEFS CWIT Word Reading 26''0e ss=10 Average  EXECUTIVE FUNCTION RAW  RANGE  Trails B 127''1e T=47 Average  WAIS-IV Matrix Reasoning -- ss=8 Average  WAIS-IV Similarities -- ss=9 Average  COWAT Letter Fluency 10+7+10 T=43 Average  Animal Naming Test  18 T=54 Average  DKEFS CWIT Inhibition 66''0e ss=13 High Average  DKEFS CWIT Inhibition/Switching 61''0e ss=15 Above Average  LANGUAGE RAW  RANGE  COWAT Letter Fluency 10+7+10 T=43 Average  Animal Naming Test 18 T=54 Average  NAB Naming Test 30/31 T=62 WNL  VISUOSPATIAL RAW  RANGE  WAIS-IV Block Design -- ss=12 High Average  WAIS-IV Matrix Reasoning -- ss=8 Average  JLO  27/30 ss=15 Above Average  *from MOANS (2005) -- -- --  BVMT-R Copy Trial 11/12 -- WNL  VERBAL LEARNING & MEMORY RAW  RANGE  HVLT Learning Trials (4+7+8)/36 T=43 Average  HVLT Delayed Recall 4/12 T=35 Below Average  HVLT Recognition Hits 10 -- --  HVLT Recognition False Positives 1 -- --  HVLT Discrimination Index 9 T=43 Average  WMS-IV LM-I  (9+12+2)/53 ss=8 Average  WMS-IV LM-II  (0+2)/39 ss=5 Below Average  WMS-IV LM Recognition  (6+7)/23 3-9%ile Below Average  VISUOSPATIAL LEARNING & MEMORY RAW  RANGE  BVMT-R Total Recall (3+5+7)/36 T=51 Average  BVMT-R Delayed Recall 7/12 T=53 Average  BVMT-R Percent Retained 100 T=55 Average  BVMT-R Recognition Hits 6 T=56 Average  BVMT-R Recognition False Alarms 1 T=40 Low Average  BVMT-R Recognition Discrimination Index 5 T=48 Average  *From Cristela Felt (2016) -- -- --  QUESTIONNAIRES RAW  RANGE  BDI 6 -- Minimal  BAI 3 -- Minimal  ESS 8 -- WNL  *Note: ss = scaled score; StdS = standard score; T = t-score; C/S = corrected raw score; WNL = within normal limits; BNL= below normal limits; D/C = discontinued. Scores from skewed distributions are typically interpreted as WNL (>=16th %ile) or BNL (<16th %ile).    INFORMED CONSENT   Patient was provided with a verbal description of the nature and purpose of the neuropsychological evaluation. Also reviewed were the foreseeable risks and/or discomforts and benefits of the procedure, limits of confidentiality, and mandatory reporting requirements of this provider. Patient was given the opportunity to have their questions answered. Oral  consent to participate was provided by the patient.   This report was prepared as part of a clinical evaluation and is not intended for forensic use.  SERVICE   This evaluation was conducted  by Annice Pih, Psy.D. In addition to time spent directly with the patient, total professional time includes record review, integration of relevant medical history, test selection, interpretation of findings, and report preparation. A technician, Shan Levans, B.S., provided testing and scoring assistance for 110 minutes.  Psychiatric Diagnostic Evaluation Services (Professional): [phone] x 1 Neuropsychological Testing Evaluation Services (Professional): [phone] x 1 Neuropsychological Testing Evaluation Services (Professional): [phone] x 1 Neuropsychological Test Administration and Scoring Radiographer, therapeutic): [phone] x 1 Neuropsychological Test Administration and Scoring (Technician): [phone] x 3  This report was generated using voice recognition software. While this document has been carefully reviewed, transcription errors may be present. I apologize in advance for any inconvenience. Please contact me if further clarification is needed.            Annice Pih, Psy.D.             Neuropsychologist

## 2023-12-03 ENCOUNTER — Ambulatory Visit: Payer: Medicare Other | Admitting: Cardiology

## 2023-12-03 ENCOUNTER — Ambulatory Visit (INDEPENDENT_AMBULATORY_CARE_PROVIDER_SITE_OTHER): Payer: Medicare Other | Admitting: Psychology

## 2023-12-03 DIAGNOSIS — R419 Unspecified symptoms and signs involving cognitive functions and awareness: Secondary | ICD-10-CM

## 2023-12-03 NOTE — Progress Notes (Signed)
   NEUROPSYCHOLOGY FEEDBACK SESSION [place]. [facility]  [place] Department of Neurology  Date of Feedback Session: 12/03/2023  REASON FOR REFERRAL   Harold Leach is an 83 year old, right-handed, White male with 12 years of formal education. He was referred for neuropsychological evaluation by Marlowe Kays, PA-C, to assess current neurocognitive functioning, document potential cognitive deficits, and assist with treatment planning.   FEEDBACK   Patient completed a comprehensive neuropsychological evaluation on 11/27/2023. Please refer to that encounter for the full report and recommendations. Briefly, results indicated a mild weakness in aspects of verbal memory, with otherwise broadly normal test performance, in the setting of preserved functional independence. While there was not enough impairment to warrant a diagnosis of mild cognitive impairment, mild verbal memory weaknesses should be monitored over time to track possible progression. Inefficient retrieval of verbal information, as well as subjective cognitive concerns, may be related to vascular health and resulting vascular changes in the brain. Serial assessment will be beneficial in clarifying the underlying etiology, monitoring his course, and adapting the treatment plan over time.   Today, the patient was accompanied by his wife. They were provided verbal feedback regarding the findings and impression during this visit, and their questions were answered. A copy of the report was provided at the conclusion of the visit.  DISPOSITION   Patient will follow up with the referring provider, Ms. Wertman. He should return for repeat neuropsychological testing in 12-18 months to monitor his course and assist with diagnosis and treatment planning.  SERVICE   This feedback session was conducted by Annice Pih, Psy.D. One unit of [id number] was billed for Dr. Robbie Lis' time spent in preparing, conducting, and documenting the current  feedback session.  This report was generated using voice recognition software. While this document has been carefully reviewed, transcription errors may be present. I apologize in advance for any inconvenience. Please contact me if further clarification is needed.

## 2023-12-18 ENCOUNTER — Encounter: Payer: Self-pay | Admitting: Physician Assistant

## 2023-12-18 ENCOUNTER — Ambulatory Visit (INDEPENDENT_AMBULATORY_CARE_PROVIDER_SITE_OTHER): Payer: Medicare Other | Admitting: Physician Assistant

## 2023-12-18 VITALS — BP 134/71 | HR 60 | Resp 18 | Ht 69.5 in | Wt 163.0 lb

## 2023-12-18 DIAGNOSIS — R419 Unspecified symptoms and signs involving cognitive functions and awareness: Secondary | ICD-10-CM

## 2023-12-18 MED ORDER — MEMANTINE HCL 5 MG PO TABS: ORAL_TABLET | ORAL | 3 refills | Status: DC

## 2023-12-18 NOTE — Progress Notes (Signed)
 Assessment/Plan:     Memory Concerns with likely vascular component  Harold Leach is a very pleasant 83 y.o. RH male with a history of hypertension, hyperlipidemia, remote history of prostate cancer, GERD, bradycardia, CAD status post TAVR, chronic insomnia, macular degeneration,  recent diagnosis of Afib, questionable history of TIA in 04/2023  and a diagnosis of Cognitive Changes with a vascular component per neuropsychological evaluation 11/2023, presenting today in follow-up for evaluation of memory concerns. Patient is on memantine 5 mg bid, tolerating well. Memory is stable. Patient is able to participate on ADLs and to drive without difficulties. Mood is good.  .      Recommendations:   Follow up in 6  months. Continue Memantine 5 mg twice daily. Side effects were discussed  (bradycardia) Repeat Neuropsych testing for clarity of diagnosis and disease trajectory   Recommend good control of cardiovascular risk factors Continue to control mood as per PCP    Subjective:   This patient is accompanied in the office by his wife  who supplements the history. Previous records as well as any outside records available were reviewed prior to todays visit.   Patient was last seen on 09/18/23 with [facility] 20/30     Any changes in memory since last visit? " About the same". Patient has some difficulty remembering recent conversations and names of people (including grandchildren). LTM may be affected as well regarding prior events. repeats oneself?  Endorsed Disoriented when walking into a room?  Patient denies    Misplacing objects? Endorsed by his wife   Wandering behavior?   denies   Any personality changes since last visit?   Denies.   Any worsening depression?: denies.   Hallucinations or paranoia?  Denies.    Seizures?   denies    Any sleep changes? Sleeps well only with trazodone, Remeron and melatonin. Denies vivid dreams, REM behavior or sleepwalking   Sleep apnea?    Denies.  Any hygiene concerns?   denies   Independent of bathing and dressing?  Endorsed  Does the patient needs help with medications? Wife is in charge  Who is in charge of the finances?  Patient and wife in charge     Any changes in appetite?  Denies.     Patient have trouble swallowing?  Denies.   Does the patient cook?  No Any kitchen accidents such as leaving the stove on?   denies   Any headaches?    Denies.   Vision changes? Denies. Chronic pain?  Endorsed, he has chronic neck pain and until recently he was using a chiropractor, was advised not to do the neck .   Ambulates with difficulty?    Denies.     Recent falls or head injuries?    Denies.       Unilateral weakness, numbness or tingling?   Denies.    Any tremors?  Denies.    Any anosmia?    Denies.    Any incontinence of urine?  Denies.   Any bowel dysfunction?  denies      Patient lives with wife     Does the patient drive?yes, wife as a Engineer, materials   Initial visit 08/2023 How long did patient have memory difficulties? " I never had a memory for my whole life, but worse over the last 2-3 years". Patient reports some difficulty remembering new information, recent conversations, names (especially the grandchildren).  He has trouble recalling some words during the conversation.  His long-term memory is  also affected, at times having trouble remembering past events. repeats oneself?  Endorsed Disoriented when walking into a room?  Patient denies    Leaving objects in unusual places?  Endorsed by his wife.  Wandering behavior? Denies.   Any personality changes, or depression, anxiety? Denies  Hallucinations or paranoia? Denies.   Seizures? denies    Any sleep changes?  Sleeps well with Remeron and trazodone as well as melatonin, denies vivid dreams, REM behavior or sleepwalking   Sleep apnea? Denies.   Any hygiene concerns?  Denies.   Independent of bathing and dressing? Endorsed  Does the patient need help with medications?  Wife is in charge  Who is in charge of the finances? Both of them  in charge     Any changes in appetite?   Denies.     Patient have trouble swallowing?  Denies.   Does the patient cook? Never did  Any headaches?  Denies.   Chronic pain?  Chronic back issues uses a chiropractor, "he also fixes my neck"-he says, last visit 2 weeks ago  Ambulates with difficulty? Denies  Recent falls or head injuries?  In the remote past he may have had a concussion but he cannot give details.     Vision changes?  He has a history of glaucoma.  In the recent past, he had what it appears to be described as amaurosis fugax, and this is being evaluated by VVS, ophthalmology, and cardiology.  Per his report, the results of all the studies were normal, including carotid ultrasound and echo. Any strokelike symptoms? this summer went to [facility] and had  an episode of  R eye blurriness, ?amaurosis fugax/TIA, with vision returning after 20 mins without other symptoms and without recurrence   Any tremors? Denies.  Any anosmia? Denies.   Any incontinence of urine? Denies.   Any bowel dysfunction? Denies.      Patient lives with his wife   History of heavy alcohol intake? Denies.   History of heavy tobacco use? Denies.   Family history of dementia?  Mother with dementia  Does patient drive?  Wife reports that he she has to direct him with driving more often than before.    Neuropsych evaluation 11/2023 Dr. Robbie Lis Briefly, results indicated a mild weakness in aspects of verbal memory, with otherwise broadly normal test performance, in the setting of preserved functional independence. While there was not enough impairment to warrant a diagnosis of mild cognitive impairment, mild verbal memory weaknesses should be monitored over time to track possible progression. Inefficient retrieval of verbal information, as well as subjective cognitive concerns, may be related to vascular health and resulting vascular changes in the  brain. Serial assessment will be beneficial in clarifying the underlying etiology, monitoring his course, and adapting the treatment plan over time.   MRI brain personally reviewed 09/2023 remarkable for Minimal chronic small vessel ischemic changes within the cerebral white matter. 2. 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 Mild generalized parenchymal atrophy.    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 Lynnette Caffey and Dr. Delia Chimes.     Past Surgical History:  Procedure Laterality Date   ABDOMINAL AORTOGRAM N/A 06/06/2022   Procedure: ABDOMINAL AORTOGRAM;  Surgeon: Orbie Pyo, MD;  Location: [facility] INVASIVE CV LAB;  Service: Cardiovascular;  Laterality: N/A;   BALLOON AORTIC VALVE VALVULOPLASTY  07/22/2022   Procedure: BALLOON AORTIC VALVE VALVULOPLASTY;  Surgeon: Orbie Pyo, MD;  Location: [facility] OR;  Service: Open Heart Surgery;;   HERNIA REPAIR     INTRAOPERATIVE TRANSTHORACIC ECHOCARDIOGRAM N/A 07/22/2022   Procedure: INTRAOPERATIVE TRANSTHORACIC ECHOCARDIOGRAM;  Surgeon: Orbie Pyo, MD;  Location: [facility] OR;  Service: Open Heart Surgery;  Laterality: N/A;   PROSTATE SURGERY  2012   RIGHT/LEFT HEART CATH AND CORONARY ANGIOGRAPHY N/A 06/06/2022   Procedure: RIGHT/LEFT HEART CATH AND CORONARY ANGIOGRAPHY;  Surgeon: Orbie Pyo, MD;  Location: [facility] INVAS[facility] CV [facility];  Service: Cardiovascular;  Laterality: N/A;   TRANSCATHETER AORTIC VALVE  REPLACEMENT, TRANSFEMORAL N/A 07/22/2022   Procedure: Transcatheter Aortic Valve Replacement, Transfemoral;  Surgeon: Orbie Pyo, MD;  Location: [facility] OR;  Service: Open Heart Surgery;  Laterality: N/A;   ULTRASOUND GUIDANCE FOR VASCULAR ACCESS N/A 07/22/2022   Procedure: ULTRASOUND GUIDANCE FOR VASCULAR ACCESS;  Surgeon: Orbie Pyo, MD;  Location: [facility] OR;  Service: Open Heart Surgery;  Laterality: N/A;     PREVIOUS MEDICATIONS:   CURRENT MEDICATIONS:  Outpatient Encounter Medications as of 12/18/2023  Medication Sig   aspirin EC 81 MG tablet Take 81 mg by mouth daily. Swallow whole.   Cholecalciferol (D3-1000 PO) Take 1,000 Units by mouth every other day.   clobetasol cream (TEMOVATE) 0.05 % APPLY TOPICALLY TO THE AFFECTED AREA TWICE DAILY (Patient taking differently: Apply 1 Application topically 2 (two) times daily as needed (irritation).)   Ginkgo Biloba 60 MG CAPS Take 60 mg by mouth daily.   magnesium oxide (MAG-OX) 400 (240 Mg) MG tablet Take 400 mg by mouth daily.   Melatonin 10 MG TABS Take 10 mg by mouth at bedtime.   mirtazapine (REMERON) 30 MG tablet TAKE 1 TO 1 AND 1/2 TABLETS BY MOUTH AT NIGHT   pantoprazole (PROTONIX) 40 MG tablet TAKE 1 TABLET(40 MG) BY MOUTH DAILY   Polyethyl Glycol-Propyl Glycol (SYSTANE OP) Place 1 drop into both eyes daily as needed (dry eye).   rosuvastatin (CRESTOR) 5 MG tablet Take 1 tablet (5 mg total) by mouth daily.   traZODone (DESYREL) 150 MG tablet TAKE 1 TABLET(150 MG) BY MOUTH AT BEDTIME AS NEEDED FOR SLEEP (Patient taking differently: Take 50 mg by mouth at bedtime.)   Turmeric (QC TUMERIC COMPLEX PO) Take 1,000 mg by mouth daily.   [DISCONTINUED] memantine (NAMENDA) 5 MG tablet Take 1 tablet (5 mg at night) for 2 weeks, then increase to 1 tablet (5 mg) twice a day   amoxicillin (AMOXIL) 500 MG tablet Take 4 tablets by mouth one hour prior to dental appointments. (Patient not taking: Reported on 12/18/2023)   memantine (NAMENDA) 5 MG  tablet Take 1 tablet (5 mg at night)  twice a day   Multiple Vitamins-Minerals (PRESERVISION AREDS 2+MULTI VIT PO) Take 2 capsules by mouth daily.   Probiotic Product (ALIGN DUALBIOTIC PO) Take by mouth.   No facility-administered encounter medications on file as of 12/18/2023.     Objective:     PHYSICAL EXAMINATION:    VITALS:   Vitals:   12/18/23 0846  BP: 134/71  Pulse: 60  Resp: 18  SpO2: 98%  Weight: 163 lb (73.9 kg)  Height: 5' 9.5" (1.765 m)    GEN:  The patient appears stated age and is in NAD. HEENT:  Normocephalic, atraumatic.   Neurological examination:  General: NAD, well-groomed, appears stated age. Orientation: The patient is alert. Oriented to person, place and date Cranial nerves: There is good facial symmetry.The speech is fluent and clear. No aphasia or dysarthria. Fund of knowledge is appropriate. Recent memory impaired and remote memory is normal.  Attention and concentration are normal.  Able to name objects and repeat phrases.  Hearing is intact to conversational tone  .  Sensation: Sensation is intact to light touch throughout Motor: Strength is at least antigravity x4. DTR's 2/4 in UE/LE      09/18/2023    9:00 AM  Montreal Cognitive Assessment   Visuospatial/ Executive (0/5) 2  Naming (0/3) 2  Attention: Read list of digits (0/2) 2  Attention: Read list of letters (0/1) 1  Attention: Serial 7 subtraction starting at 100 (0/3) 3  Language: Repeat phrase (0/2) 2  Language : Fluency (0/1) 0  Abstraction (0/2) 1  Delayed Recall (0/5) 0  Orientation (0/6) 6  Total 19  Adjusted Score (based on education) 20       01/02/2017    9:19 AM  MMSE - Mini Mental State Exam  Orientation to time 5  Orientation to Place 5  Registration 3  Attention/ Calculation 5  Recall 3  Language- name 2 objects 2  Language- repeat 1  Language- follow 3 step command 3  Language- read & follow direction 1  Write a sentence 1  Copy design 1  Total score 30        Movement examination: Tone: There is normal tone in the UE/LE Abnormal movements:  no tremor.  No myoclonus.  No asterixis.   Coordination:  There is no decremation with RAM's. Normal finger to nose  Gait and Station: The patient has no difficulty arising out of a deep-seated chair without the use of the hands. The patient's stride length is good.  Gait is cautious and narrow.   Thank you for allowing [place] the opportunity to participate in the care of this nice patient. Please do not hesitate to contact us for any questions or concerns.   Total time spent on today's visit was 20 minutes dedicated to this patient today, preparing to see patient, examining the patient, ordering tests and/or medications and counseling the patient, documenting clinical information in the EHR or other health record, independently interpreting results and communicating results to the patient/family, discussing treatment and goals, answering patient's questions and coordinating care.  Cc:  Jarold Motto, PA  Marlowe Kays 12/18/2023 12:23 PM

## 2023-12-18 NOTE — Patient Instructions (Addendum)
 It was a pleasure to see you today at our office.   Recommendations:   Continue memantine  5 mg  twice a day   Follow up in 1 year  Neuropsych evaluation in 1 year    For psychiatric meds, mood meds: Please have your primary care physician manage these medications.  If you have any severe symptoms of a stroke, or other severe issues such as confusion,severe chills or fever, etc call 911 or go to the ER as you may need to be evaluated further    For assessment of decision of mental capacity and competency:  Call Dr. Erick Blinks, geriatric psychiatrist at [phone]  Counseling regarding caregiver distress, including caregiver depression, anxiety and issues regarding community resources, adult day care programs, adult living facilities, or memory care questions:  please contact your  Primary Doctor's Social Worker   Whom to call: Memory  decline, memory medications: Call our office [phone]    [email or web address]   RECOMMENDATIONS FOR ALL PATIENTS WITH MEMORY PROBLEMS: 1. Continue to exercise (Recommend 30 minutes of walking everyday, or 3 hours every week) 2. Increase social interactions - continue going to [place] and enjoy social gatherings with friends and family 3. Eat healthy, avoid fried foods and eat more fruits and vegetables 4. Maintain adequate blood pressure, blood sugar, and blood cholesterol level. Reducing the risk of stroke and cardiovascular disease also helps promoting better memory. 5. Avoid stressful situations. Live a simple life and avoid aggravations. Organize your time and prepare for the next day in anticipation. 6. Sleep well, avoid any interruptions of sleep and avoid any distractions in the bedroom that may interfere with adequate sleep quality 7. Avoid sugar, avoid sweets as there is a strong link between excessive sugar intake, diabetes, and cognitive impairment We discussed the Mediterranean  diet, which has been shown to help patients reduce the risk of progressive memory disorders and reduces cardiovascular risk. This includes eating fish, eat fruits and green leafy vegetables, nuts like almonds and hazelnuts, walnuts, and also use olive oil. Avoid fast foods and fried foods as much as possible. Avoid sweets and sugar as sugar use has been linked to worsening of memory function.  There is always a concern of gradual progression of memory problems. If this is the case, then we may need to adjust level of care according to patient needs. Support, both to the patient and caregiver, should then be put into place.        DRIVING: Regarding driving, in patients with progressive memory problems, driving will be impaired. We advise to have someone else do the driving if trouble finding directions or if minor accidents are reported. Independent driving assessment is available to determine safety of driving.   If you are interested in the driving assessment, you can contact the following:  [employer] in [place] [phone]  [facility] [phone]  [facility] [phone]  [facility] [phone] or [phone]   FALL PRECAUTIONS: Be cautious when walking. Scan the area for obstacles that may increase the risk of trips and falls. When getting up in the mornings, sit up at the edge of the bed for a few minutes before getting out of bed. Consider elevating the bed at the head end to avoid drop of blood pressure when getting up. Walk always in a well-lit room (use night lights in the walls). Avoid area rugs or power cords from appliances in the middle of the walkways. Use a walker or a cane  if necessary and consider physical therapy for balance exercise. Get your eyesight checked regularly.  FINANCIAL OVERSIGHT: Supervision, especially oversight when making financial decisions or transactions is also recommended.  HOME SAFETY:  Consider the safety of the kitchen when operating appliances like stoves, microwave oven, and blender. Consider having supervision and share cooking responsibilities until no longer able to participate in those. Accidents with firearms and other hazards in the house should be identified and addressed as well.   ABILITY TO BE LEFT ALONE: If patient is unable to contact 911 operator, consider using LifeLine, or when the need is there, arrange for someone to stay with patients. Smoking is a fire hazard, consider supervision or cessation. Risk of wandering should be assessed by caregiver and if detected at any point, supervision and safe proof recommendations should be instituted.  MEDICATION SUPERVISION: Inability to self-administer medication needs to be constantly addressed. Implement a mechanism to ensure safe administration of the medications.      Mediterranean Diet A Mediterranean diet refers to food and lifestyle choices that are based on the traditions of countries located on the [place]. This way of eating has been shown to help prevent certain conditions and improve outcomes for people who have chronic diseases, like kidney disease and heart disease. What are tips for following this plan? Lifestyle  Cook and eat meals together with your family, when possible. Drink enough fluid to keep your urine clear or pale yellow. Be physically active every day. This includes: Aerobic exercise like running or swimming. Leisure activities like gardening, walking, or housework. Get 7-8 hours of sleep each night. If recommended by your health care provider, drink red wine in moderation. This means 1 glass a day for nonpregnant women and 2 glasses a day for men. A glass of wine equals 5 oz (150 mL). Reading food labels  Check the serving size of packaged foods. For foods such as rice and pasta, the serving size refers to the amount of cooked product, not dry. Check the total fat in packaged  foods. Avoid foods that have saturated fat or trans fats. Check the ingredients list for added sugars, such as corn syrup. Shopping  At the grocery store, buy most of your food from the areas near the walls of the store. This includes: Fresh fruits and vegetables (produce). Grains, beans, nuts, and seeds. Some of these may be available in unpackaged forms or large amounts (in bulk). Fresh seafood. Poultry and eggs. Low-fat dairy products. Buy whole ingredients instead of prepackaged foods. Buy fresh fruits and vegetables in-season from local farmers markets. Buy frozen fruits and vegetables in resealable bags. If you do not have access to quality fresh seafood, buy precooked frozen shrimp or canned fish, such as tuna, salmon, or sardines. Buy small amounts of raw or cooked vegetables, salads, or olives from the deli or salad bar at your store. Stock your pantry so you always have certain foods on hand, such as olive oil, canned tuna, canned tomatoes, rice, pasta, and beans. Cooking  Cook foods with extra-virgin olive oil instead of using butter or other vegetable oils. Have meat as a side dish, and have vegetables or grains as your main dish. This means having meat in small portions or adding small amounts of meat to foods like pasta or stew. Use beans or vegetables instead of meat in common dishes like chili or lasagna. Experiment with different cooking methods. Try roasting or broiling vegetables instead of steaming or sauteing them. Add frozen vegetables to  soups, stews, pasta, or rice. Add nuts or seeds for added healthy fat at each meal. You can add these to yogurt, salads, or vegetable dishes. Marinate fish or vegetables using olive oil, lemon juice, garlic, and fresh herbs. Meal planning  Plan to eat 1 vegetarian meal one day each week. Try to work up to 2 vegetarian meals, if possible. Eat seafood 2 or more times a week. Have healthy snacks readily available, such as: Vegetable  sticks with hummus. Greek yogurt. Fruit and nut trail mix. Eat balanced meals throughout the week. This includes: Fruit: 2-3 servings a day Vegetables: 4-5 servings a day Low-fat dairy: 2 servings a day Fish, poultry, or lean meat: 1 serving a day Beans and legumes: 2 or more servings a week Nuts and seeds: 1-2 servings a day Whole grains: 6-8 servings a day Extra-virgin olive oil: 3-4 servings a day Limit red meat and sweets to only a few servings a month What are my food choices? Mediterranean diet Recommended Grains: Whole-grain pasta. Brown rice. Bulgar wheat. Polenta. Couscous. Whole-wheat bread. Orpah Cobb. Vegetables: Artichokes. Beets. Broccoli. Cabbage. Carrots. Eggplant. Green beans. Chard. Kale. Spinach. Onions. Leeks. Peas. Squash. Tomatoes. Peppers. Radishes. Fruits: Apples. Apricots. Avocado. Berries. Bananas. Cherries. Dates. Figs. Grapes. Lemons. Melon. Oranges. Peaches. Plums. Pomegranate. Meats and other protein foods: Beans. Almonds. Sunflower seeds. Pine nuts. Peanuts. Cod. Salmon. Scallops. Shrimp. Tuna. Tilapia. Clams. Oysters. Eggs. Dairy: Low-fat milk. Cheese. Greek yogurt. Beverages: Water. Red wine. Herbal tea. Fats and oils: Extra virgin olive oil. Avocado oil. Grape seed oil. Sweets and desserts: [place] yogurt with honey. Baked apples. Poached pears. Trail mix. Seasoning and other foods: Basil. Cilantro. Coriander. Cumin. Mint. Parsley. Sage. Rosemary. Tarragon. Garlic. Oregano. Thyme. Pepper. Balsalmic vinegar. Tahini. Hummus. Tomato sauce. Olives. Mushrooms. Limit these Grains: Prepackaged pasta or rice dishes. Prepackaged cereal with added sugar. Vegetables: Deep fried potatoes (french fries). Fruits: Fruit canned in syrup. Meats and other protein foods: Beef. Pork. Lamb. Poultry with skin. Hot dogs. Tomasa Blase. Dairy: Ice cream. Sour cream. Whole milk. Beverages: Juice. Sugar-sweetened soft drinks. Beer. Liquor and spirits. Fats and oils: Butter. Canola  oil. Vegetable oil. Beef fat (tallow). Lard. Sweets and desserts: Cookies. Cakes. Pies. Candy. Seasoning and other foods: Mayonnaise. Premade sauces and marinades. The items listed may not be a complete list. Talk with your dietitian about what dietary choices are right for you. Summary The Mediterranean diet includes both food and lifestyle choices. Eat a variety of fresh fruits and vegetables, beans, nuts, seeds, and whole grains. Limit the amount of red meat and sweets that you eat. Talk with your health care provider about whether it is safe for you to drink red wine in moderation. This means 1 glass a day for nonpregnant women and 2 glasses a day for men. A glass of wine equals 5 oz (150 mL). This information is not intended to replace advice given to you by your health care provider. Make sure you discuss any questions you have with your health care provider. Document Released: 05/01/2016 Document Revised: 06/03/2016 Document Reviewed: 05/01/2016 Elsevier Interactive Patient Education  2017 [employer].

## 2023-12-20 DIAGNOSIS — I340 Nonrheumatic mitral (valve) insufficiency: Secondary | ICD-10-CM | POA: Insufficient documentation

## 2023-12-20 DIAGNOSIS — R9389 Abnormal findings on diagnostic imaging of other specified body structures: Secondary | ICD-10-CM | POA: Insufficient documentation

## 2023-12-20 NOTE — Progress Notes (Unsigned)
 Cardiology Office Note:   Date:  12/23/2023  ID:  Harold Leach, Harold Leach [birth year], MRN [id number] PCP: Jarold Motto, PA  [facility] Providers Cardiologist:  Rollene Rotunda, MD {  History of Present Illness:   Harold Leach is a 83 y.o. male  who was referred by Jarold Motto, PA for evaluation of chest pain.    In 2021 a POET (Plain Old Exercise Treadmill) was negative for ischemia.  Echo in May demonstrated a normal EF with AS with a mean gradient of 36 mm.  DI 0.17, AVA 0.75 cm2.   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  Most recent echo in October demonstrated an EF of 55% with normally functioning TAVR with a mean gradient of 4 and trivial paravalvular leak.  There is mild mitral regurgitation.  The aorta is 44 mm.  He wore a monitor that demonstrated no atrial fibrillation.  He had frequent SVT with the longest lasting 45 seconds.  He had nonsustained ventricular tachycardia with the longest being 8 beats.  PVCs were 10.9% of his total beats but he did not have any of this.   Since I last saw him he has had no new problems.  He denies any cardiovascular symptoms.  He is putting a new motor in one of his cars.  He is active in the yard. The patient denies any new symptoms such as chest discomfort, neck or arm discomfort. There has been no new shortness of breath, PND or orthopnea. There have been no reported palpitations, presyncope or syncope.   ROS: As stated in the HPI and negative for all other systems.  Studies Reviewed:    EKG:   NA  Risk Assessment/Calculations:              Physical Exam:   VS:  BP 118/60 (BP Location: Left Arm, Patient Position: Sitting)   Pulse (!) 58   Ht 5' 9.5" (1.765 m)   Wt 161 lb (73 kg)   SpO2 96%   BMI 23.43 kg/m    Wt Readings from Last 3 Encounters:  12/23/23 161 lb (73 kg)  12/18/23 163 lb (73.9 kg)  09/29/23 167 lb (75.8 kg)     GEN:  Well nourished, well developed in no acute distress NECK: No JVD; No carotid bruits CARDIAC: RRR, 2 out of 6 diastolic murmur heard best at the fourth and fifth left intercostal space, no systolic murmurs, rubs, gallops RESPIRATORY:  Clear to auscultation without rales, wheezing or rhonchi  ABDOMEN: Soft, non-tender, non-distended EXTREMITIES:  No edema; No deformity   ASSESSMENT AND PLAN:   AS:  He is status post TAVR.   He has some mild paravalvular leak.  He is up-to-date with follow-up.  No change in meds and no further testing until I see him probably next year.  He understands endocarditis prophylaxis.   BRADYCARDIA:   He had no symptomatic bradycardia.  He has no symptoms.  No change in therapy.  ABNORMAL CT:    The aorta was 40 mm.  No change in therapy.  TIA:    He had 1 episode of this.  He had no significant findings on MRI.  He had no further tachypalpitations.  He has had nothing on a monitor TSH suggest A-fib.  If he has recurrent symptoms he will need loop monitoring.  H  ECTOPY: He does have ectopy with nonsustained arrhythmias but he is  not symptomatic with these.  No change in therapy.    MR: This was very mild and I will follow this clinically.     Follow up with me in 12 months.  He would like to be seen in [place].  Signed, Rollene Rotunda, MD

## 2023-12-23 ENCOUNTER — Ambulatory Visit: Payer: Medicare Other | Attending: Cardiology | Admitting: Cardiology

## 2023-12-23 ENCOUNTER — Encounter: Payer: Self-pay | Admitting: Cardiology

## 2023-12-23 VITALS — BP 118/60 | HR 58 | Ht 69.5 in | Wt 161.0 lb

## 2023-12-23 DIAGNOSIS — R9389 Abnormal findings on diagnostic imaging of other specified body structures: Secondary | ICD-10-CM | POA: Insufficient documentation

## 2023-12-23 DIAGNOSIS — I340 Nonrheumatic mitral (valve) insufficiency: Secondary | ICD-10-CM | POA: Insufficient documentation

## 2023-12-23 DIAGNOSIS — R001 Bradycardia, unspecified: Secondary | ICD-10-CM | POA: Insufficient documentation

## 2023-12-23 DIAGNOSIS — Z952 Presence of prosthetic heart valve: Secondary | ICD-10-CM | POA: Diagnosis not present

## 2023-12-23 NOTE — Patient Instructions (Signed)
 Medication Instructions:  No changes.  *If you need a refill on your cardiac medications before your next appointment, please call your pharmacy*   Follow-Up: At [facility], you and your health needs are our priority.  As part of our continuing mission to provide you with exceptional heart care, our providers are all part of one team.  This team includes your primary Cardiologist (physician) and Advanced Practice Providers or APPs (Physician Assistants and Nurse Practitioners) who all work together to provide you with the care you need, when you need it.  Your next appointment:   1 year(s)  In [place] office please.   Provider:   Rollene Rotunda, MD     We recommend signing up for the patient portal called "MyChart".  Sign up information is provided on this After Visit Summary.  MyChart is used to connect with patients for Virtual Visits (Telemedicine).  Patients are able to view lab/test results, encounter notes, upcoming appointments, etc.  Non-urgent messages can be sent to your provider as well.   To learn more about what you can do with MyChart, go to [email or web address].   Other Instructions       1st Floor: - Lobby - Registration  - Pharmacy  - Lab - Cafe  2nd Floor: - PV Lab - Diagnostic Testing (echo, CT, nuclear med)  3rd Floor: - Vacant  4th Floor: - TCTS (cardiothoracic surgery) - AFib Clinic - Structural Heart Clinic - Vascular Surgery  - Vascular Ultrasound  5th Floor: - HeartCare Cardiology (general and EP) - Clinical Pharmacy for coumadin, hypertension, lipid, weight-loss medications, and med management appointments    Valet parking services will be available as well.

## 2024-01-13 ENCOUNTER — Telehealth: Payer: Self-pay | Admitting: *Deleted

## 2024-01-13 DIAGNOSIS — G5793 Unspecified mononeuropathy of bilateral lower limbs: Secondary | ICD-10-CM

## 2024-01-13 NOTE — Telephone Encounter (Signed)
 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 Discussed with Ova Bloomer verbal order given for referral to Sports Medicine.

## 2024-01-13 NOTE — Telephone Encounter (Signed)
 Spoke to pt told him Harold Leach said to put referral for Sports Medicine for your feet. Told him someone will contact you to schedule an appointment. Pt verbalized understanding.

## 2024-01-20 ENCOUNTER — Ambulatory Visit (INDEPENDENT_AMBULATORY_CARE_PROVIDER_SITE_OTHER): Admitting: Family Medicine

## 2024-01-20 VITALS — BP 128/84 | HR 51 | Ht 69.5 in | Wt 162.0 lb

## 2024-01-20 DIAGNOSIS — R202 Paresthesia of skin: Secondary | ICD-10-CM | POA: Diagnosis not present

## 2024-01-20 NOTE — Patient Instructions (Addendum)
 Thank you for coming in today.   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   I can order a nerve test if want me too.   Check you feet every days to make sure there are no developing sore sports or wounds.   Let me know if this is not OK.

## 2024-01-20 NOTE — Progress Notes (Signed)
   Joanna Muck, PhD, LAT, ATC acting as a scribe for Garlan Juniper, MD.  Harold Leach is a 83 y.o. male who presents to [facility]  [facility] today for bilat foot paresthesia x 3-4 years. Pt locates pain to the plantar aspect of the foot, mid-arch into toes. He also c/o R hand paresethis, esp when driving, and in cold weather.   Treatments tried: Toe warmers in the winter  Pertinent review of systems: No fevers or chills  Relevant historical information: History of transcatheter aortic valve replacement.  History of prostate cancer.  Patient is still busy restoring old Ford model As   Exam:  BP 128/84   Pulse (!) 51   Ht 5' 9.5" (1.765 m)   Wt 162 lb (73.5 kg)   SpO2 98%   BMI 23.58 kg/m  General: Well Developed, well nourished, and in no acute distress.   MSK: Feet bilaterally onychomycosis present great toes toenails bilaterally.  Otherwise normal. Decreased sensation to light touch and to monofilament bilateral metatarsal  plantar areas.  Sensation is intact dorsal foot.  Pulses capillary refill are intact.     Assessment and Plan: 83 y.o. male with bilateral foot decree sensitivity and paresthesias.  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  We talked about options.  The obvious test to perform here would be a nerve conduction study.  After discussion he would like to wait on that which I think is reasonable.  We talked about medication options.  His symptoms are not particularly bothersome so gabapentin probably would not be helpful.  Plan for watchful waiting and good foot inspection daily.  Check back as needed.  Happy to order a nerve conduction study if he would like.   PDMP not reviewed this encounter. No orders of the defined types were placed in this encounter.  No orders of the defined types were placed in this encounter.    Discussed warning signs or symptoms. Please see discharge instructions.  Patient expresses understanding.   The above documentation has been reviewed and is accurate and complete Garlan Juniper, M.D.

## 2024-02-03 DIAGNOSIS — D3617 Benign neoplasm of peripheral nerves and autonomic nervous system of trunk, unspecified: Secondary | ICD-10-CM | POA: Diagnosis not present

## 2024-02-03 DIAGNOSIS — L718 Other rosacea: Secondary | ICD-10-CM | POA: Diagnosis not present

## 2024-03-05 ENCOUNTER — Other Ambulatory Visit: Payer: Self-pay | Admitting: Physician Assistant

## 2024-03-05 DIAGNOSIS — K219 Gastro-esophageal reflux disease without esophagitis: Secondary | ICD-10-CM

## 2024-03-14 ENCOUNTER — Other Ambulatory Visit: Payer: Self-pay | Admitting: Physician Assistant

## 2024-06-09 ENCOUNTER — Other Ambulatory Visit: Payer: Self-pay | Admitting: Physician Assistant

## 2024-07-19 ENCOUNTER — Ambulatory Visit: Payer: Medicare Other

## 2024-07-19 VITALS — BP 124/78 | Temp 97.9°F | Ht 69.5 in | Wt 163.0 lb

## 2024-07-19 DIAGNOSIS — Z0000 Encounter for general adult medical examination without abnormal findings: Secondary | ICD-10-CM | POA: Diagnosis not present

## 2024-07-19 DIAGNOSIS — Z23 Encounter for immunization: Secondary | ICD-10-CM | POA: Diagnosis not present

## 2024-07-19 NOTE — Patient Instructions (Signed)
 Harold Leach,  Thank you for taking the time for your Medicare Wellness Visit. I appreciate your continued commitment to your health goals. Please review the care plan we discussed, and feel free to reach out if I can assist you further.  Medicare recommends these wellness visits once per year to help you and your care team stay ahead of potential health issues. These visits are designed to focus on prevention, allowing your provider to concentrate on managing your acute and chronic conditions during your regular appointments.  Please note that Annual Wellness Visits do not include a physical exam. Some assessments may be limited, especially if the visit was conducted virtually. If needed, we may recommend a separate in-person follow-up with your provider.  Ongoing Care Seeing your primary care provider every 3 to 6 months helps us  monitor your health and provide consistent, personalized care.   Referrals If a referral was made during today's visit and you haven't received any updates within two weeks, please contact the referred provider directly to check on the status.  Recommended Screenings:  Health Maintenance  Topic Date Due   Flu Shot  04/22/2024   COVID-19 Vaccine (7 - 2025-26 season) 05/23/2024   Medicare Annual Wellness Visit  07/13/2024   DTaP/Tdap/Td vaccine (2 - Td or Tdap) 09/12/2026   Pneumococcal Vaccine for age over 50  Completed   Zoster (Shingles) Vaccine  Completed   Meningitis B Vaccine  Aged Out   Colon Cancer Screening  Discontinued   Hepatitis C Screening  Discontinued       12/18/2023    8:54 AM  Advanced Directives  Does Patient Have a Medical Advance Directive? Yes  Type of Advance Directive Healthcare Power of Attorney  Does patient want to make changes to medical advance directive? No - Patient declined  Copy of Healthcare Power of Attorney in Chart? No - copy requested   Advance Care Planning is important because it: Ensures you receive medical care  that aligns with your values, goals, and preferences. Provides guidance to your family and loved ones, reducing the emotional burden of decision-making during critical moments.  Vision: Annual vision screenings are recommended for early detection of glaucoma, cataracts, and diabetic retinopathy. These exams can also reveal signs of chronic conditions such as diabetes and high blood pressure.  Dental: Annual dental screenings help detect early signs of oral cancer, gum disease, and other conditions linked to overall health, including heart disease and diabetes.  Please see the attached documents for additional preventive care recommendations.

## 2024-07-19 NOTE — Progress Notes (Signed)
 Subjective:   Harold Leach is a 83 y.o. who presents for a Medicare Wellness preventive visit.  As a reminder, Annual Wellness Visits don't include a physical exam, and some assessments may be limited, especially if this visit is performed virtually. We may recommend an in-person follow-up visit with your provider if needed.  Visit Complete: In person    Persons Participating in Visit: Patient.  AWV Questionnaire: No: Patient Medicare AWV questionnaire was not completed prior to this visit.  Cardiac Risk Factors include: advanced age (>75men, >51 women);dyslipidemia;male gender     Objective:    Today's Vitals   07/19/24 1109  Weight: 163 lb (73.9 kg)  Height: 5' 9.5 (1.765 m)  PainSc: 4   PainLoc: Foot   Body mass index is 23.73 kg/m.     07/19/2024   11:16 AM 12/18/2023    8:54 AM 09/18/2023    9:44 AM 07/14/2023   11:02 AM 07/22/2022    8:33 AM 06/06/2022    9:49 AM 05/12/2022    1:59 PM  Advanced Directives  Does Patient Have a Medical Advance Directive? Yes Yes Yes Yes No Yes Yes  Type of Estate Agent of [place];Living will Healthcare Power of [place] of [place];Living will Healthcare Power of [place];Living will  Healthcare Power of [place];Living will Healthcare Power of [place];Living will  Does patient want to make changes to medical advance directive? No - Patient declined No - Patient declined  No - Patient declined   No - Patient declined  Copy of Healthcare Power of Attorney in Chart? Yes - validated most recent copy scanned in chart (See row information) No - copy requested  Yes - validated most recent copy scanned in chart (See row information)   Yes - validated most recent copy scanned in chart (See row information)  Would patient like information on creating a medical advance directive?     No - Patient declined      Current Medications (verified) Outpatient Encounter Medications as of 07/19/2024   Medication Sig   aspirin  EC 81 MG tablet Take 81 mg by mouth daily. Swallow whole.   Cholecalciferol (D3-1000 PO) Take 1,000 Units by mouth every other day.   clobetasol  cream (TEMOVATE ) 0.05 % APPLY TOPICALLY TO THE AFFECTED AREA TWICE DAILY   MAGNESIUM  GLYCINATE PO Take 2 capsules by mouth daily at 6 (six) AM.   magnesium  oxide (MAG-OX) 400 (240 Mg) MG tablet Take 400 mg by mouth daily.   Melatonin 10 MG TABS Take 10 mg by mouth at bedtime.   memantine  (NAMENDA ) 5 MG tablet Take 1 tablet (5 mg at night)  twice a day   mirtazapine  (REMERON ) 30 MG tablet TAKE 1 TO 1 AND 1/2 TABLETS BY MOUTH AT NIGHT   Multiple Vitamins-Minerals (PRESERVISION AREDS 2+MULTI VIT PO) Take 2 capsules by mouth daily.   pantoprazole  (PROTONIX ) 40 MG tablet TAKE 1 TABLET(40 MG) BY MOUTH DAILY   Polyethyl Glycol-Propyl Glycol (SYSTANE OP) Place 1 drop into both eyes daily as needed (dry eye).   Probiotic Product (ALIGN DUALBIOTIC PO) Take by mouth.   rosuvastatin  (CRESTOR ) 5 MG tablet Take 1 tablet (5 mg total) by mouth daily.   traZODone  (DESYREL ) 150 MG tablet TAKE 1 TABLET(150 MG) BY MOUTH AT BEDTIME AS NEEDED FOR SLEEP   Turmeric (QC TUMERIC COMPLEX PO) Take 1,000 mg by mouth daily.   amoxicillin  (AMOXIL ) 500 MG tablet Take 4 tablets by mouth one hour prior to dental appointments. (Patient not  taking: Reported on 07/19/2024)   Ginkgo Biloba 60 MG CAPS Take 60 mg by mouth daily.   No facility-administered encounter medications on file as of 07/19/2024.    Allergies (verified) Patient has no known allergies.   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 Wendel and Dr. Murriel.   Past Surgical History:  Procedure Laterality Date   ABDOMINAL AORTOGRAM N/A 06/06/2022   Procedure: ABDOMINAL AORTOGRAM;  Surgeon: Wendel Lurena POUR, MD;  Location: [facility];  Service: Cardiovascular;  Laterality: N/A;   BALLOON AORTIC VALVE VALVULOPLASTY  07/22/2022   Procedure: BALLOON AORTIC VALVE VALVULOPLASTY;  Surgeon: Wendel Lurena POUR, MD;  Location: [facility] OR;  Service: Open Heart Surgery;;   HERNIA REPAIR     INTRAOPERATIVE TRANSTHORACIC ECHOCARDIOGRAM N/A 07/22/2022   Procedure: INTRAOPERATIVE TRANSTHORACIC ECHOCARDIOGRAM;  Surgeon: Wendel Lurena POUR, MD;  Location: [facility] OR;  Service: Open Heart Surgery;  Laterality: N/A;   PROSTATE SURGERY  2012   RIGHT/LEFT HEART CATH AND CORONARY ANGIOGRAPHY N/A 06/06/2022   Procedure: RIGHT/LEFT HEART CATH AND CORONARY ANGIOGRAPHY;  Surgeon: Wendel Lurena POUR, MD;  Location: [facility];  Service: Cardiovascular;  Laterality: N/A;   TRANSCATHETER AORTIC VALVE REPLACEMENT, TRANSFEMORAL N/A 07/22/2022   Procedure: Transcatheter Aortic Valve Replacement, Transfemoral;  Surgeon: Thukkani, Arun K, MD;  Location: [facility] OR;  Service: Open Heart Surgery;  Laterality: N/A;   ULTRASOUND GUIDANCE FOR VASCULAR ACCESS N/A 07/22/2022   Procedure: ULTRASOUND GUIDANCE FOR VASCULAR ACCESS;  Surgeon: Wendel Lurena POUR, MD;  Location: [facility] OR;  Service: Open Heart Surgery;  Laterality: N/A;   Family History  Problem Relation Age of Onset   Heart disease Mother        CHF   Hypertension Mother    Dementia Mother        concussion x 2   Prostate cancer Father    Valvular heart disease Brother    Heart disease Maternal Grandmother    Stroke Maternal Grandmother    Social History   Socioeconomic History   Marital status: Married    Spouse name: Jenkins   Number of children: 3   Years of education: 12   Highest education level: High school  graduate  Occupational History   Occupation: Retired  Tobacco Use   Smoking status: Former    Types: Pipe    Quit date: 03/20/1981    Years since quitting: 43.3   Smokeless tobacco: Never   Tobacco comments:    Quit in 1983 pipe smoker   Vaping Use   Vaping status: Never Used  Substance and Sexual Activity   Alcohol use: No   Drug use: No   Sexual activity: Yes    Partners: Female  Other Topics Concern   Not on file  Social History Narrative   Are you right handed or left handed? Right hand   Are you currently employed ?    What is your current occupation? retired   Do you live at home alone?   Who lives with you? wife   What type of home do  you live in: 1 story or 2 story? two   Caffiene 1 time a day    Social Drivers of Corporate Investment Banker Strain: Low Risk  (07/19/2024)   Overall Financial Resource Strain (CARDIA)    Difficulty of Paying Living Expenses: Not hard at all  Food Insecurity: No Food Insecurity (07/19/2024)   Hunger Vital Sign    Worried About Running Out of Food in the Last Year: Never true    Ran Out of Food in the Last Year: Never true  Transportation Needs: No Transportation Needs (07/19/2024)   PRAPARE - Administrator, Civil Service (Medical): No    Lack of Transportation (Non-Medical): No  Physical Activity: Sufficiently Active (07/19/2024)   Exercise Vital Sign    Days of Exercise per Week: 5 days    Minutes of Exercise per Session: 30 min  Stress: No Stress Concern Present (07/19/2024)   [employer] of Occupational Health - Occupational Stress Questionnaire    Feeling of Stress: Not at all  Social Connections: Moderately Integrated (07/19/2024)   Social Connection and Isolation Panel    Frequency of Communication with Friends and Family: Three times a week    Frequency of Social Gatherings with Friends and Family: Three times a week    Attends Religious Services: Never    Active Member of Clubs or Organizations: Yes     Attends Banker Meetings: 1 to 4 times per year    Marital Status: Married    Tobacco Counseling Counseling given: Not Answered Tobacco comments: Quit in 1983 pipe smoker     Clinical Intake:  Pre-visit preparation completed: Yes  Pain : 0-10 Pain Score: 4  Pain Type: Chronic pain Pain Location: Foot Pain Orientation: Left, Right Pain Descriptors / Indicators: Aching Pain Onset: More than a month ago Pain Frequency: Intermittent     BMI - recorded: 23.73 Nutritional Status: BMI of 19-24  Normal Diabetes: No  No results found for: HGBA1C   How often do you need to have someone help you when you read instructions, pamphlets, or other written materials from your doctor or pharmacy?: 1 - Never  Interpreter Needed?: No  Information entered by :: Ellouise Haws, LPN   Activities of Daily Living     07/19/2024   11:12 AM  In your present state of health, do you have any difficulty performing the following activities:  Hearing? 1  Comment hearing aids  Vision? 0  Difficulty concentrating or making decisions? 0  Walking or climbing stairs? 0  Dressing or bathing? 0  Doing errands, shopping? 0  Preparing Food and eating ? N  Using the Toilet? N  In the past six months, have you accidently leaked urine? Y  Comment at times wears a pad  Do you have problems with loss of bowel control? N  Managing your Medications? N  Managing your Finances? N  Housekeeping or managing your Housekeeping? N    Patient Care Team: Job Lukes, [place] as PCP - General (Physician Assistant) Lavona Agent, MD as PCP - Cardiology (Cardiology) Renda Glance, MD as Consulting Physician (Urology)  I have updated your Care Teams any recent Medical Services you may have received from other providers in the past year.     Assessment:   This is a routine wellness examination for Xylan.  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 1 0 1 1 0  Number falls in past yr: 1 0 1 1 0  Injury with Fall? 0 0 0 0   Risk for fall due to : History of fall(s);Impaired mobility   History of fall(s)   Follow up Falls prevention discussed Falls evaluation completed Falls evaluation completed Falls prevention discussed Falls evaluation completed      Data saved with a previous flowsheet row definition    MEDICARE RISK AT HOME:  Medicare Risk at Home Any stairs in or around the home?: Yes If so, are there any without handrails?: No Home free of loose throw rugs in walkways, pet beds, electrical cords, etc?: Yes Adequate lighting in your home to reduce risk of falls?: Yes Life alert?: No Use of a cane, walker or w/c?: Yes (walkign stick on trail) Grab bars in the bathroom?: No Shower chair or bench in shower?: Yes Elevated toilet seat or a handicapped toilet?: No  TIMED UP AND GO:  Was the test performed?  Yes  Length of time to ambulate 10 feet: 10 sec Gait steady and fast without use of assistive device  Cognitive Function: 6CIT completed    01/02/2017    9:19 AM  MMSE - Mini Mental State Exam  Orientation to time 5   Orientation to Place 5   Registration 3   Attention/ Calculation 5   Recall 3   Language- name 2 objects 2   Language- repeat 1  Language- follow 3 step  command 3   Language- read & follow direction 1   Write a sentence 1   Copy design 1   Total score 30      Data saved with a previous flowsheet row definition      09/18/2023    9:00 AM  Montreal Cognitive Assessment   Visuospatial/ Executive (0/5) 2  Naming (0/3) 2  Attention: Read list of digits (0/2) 2  Attention: Read list of letters (0/1) 1  Attention: Serial 7 subtraction starting at 100 (0/3) 3  Language: Repeat phrase (0/2) 2  Language : Fluency (0/1) 0  Abstraction (0/2) 1  Delayed Recall (0/5) 0  Orientation (0/6) 6  Total 19  Adjusted Score (based on education) 20      07/19/2024   11:19 AM 07/14/2023   11:06 AM 05/12/2022    2:13 PM 05/03/2021   11:47 AM 04/26/2020   12:13 PM  6CIT Screen  What Year? 0 points 0 points 0 points 0 points 0 points  What month? 0 points 0 points 0 points 0 points 0 points  What time? 0 points 0 points 0 points 0 points   Count back from 20 0 points 0 points 0 points 0 points 0 points  Months in reverse 2 points 2 points 0 points 0 points 0 points  Repeat phrase 2 points 0 points 0 points 0 points 0 points  Total Score 4 points 2 points 0 points 0 points     Immunizations Immunization History  Administered Date(s) Administered   Fluad Quad(high Dose 65+) 07/25/2022   INFLUENZA, HIGH DOSE SEASONAL PF 08/21/2016, 07/07/2018, 06/06/2019, 06/13/2020, 07/10/2021   Influenza Whole 06/29/2017   Influenza, Seasonal, Injecte, Preservative Fre 06/29/2017   Influenza,inj,quad, With Preservative 06/23/2019   Influenza-Unspecified 08/21/2016   Moderna Covid-19 Fall Seasonal Vaccine 61yrs & older 06/05/2023   Moderna Covid-19 Vaccine  Bivalent Booster 36yrs & up 06/04/2021   Moderna Sars-Covid-2 Vaccination 10/04/2019, 11/04/2019, 06/27/2020, 04/02/2022   Pneumococcal Conjugate-13 10/10/2015   Pneumococcal Polysaccharide-23 10/06/2009   Tdap 09/12/2016   Zoster Recombinant(Shingrix) 06/06/2019, 12/18/2019    Screening Tests Health  Maintenance  Topic Date Due   Influenza Vaccine  04/22/2024   COVID-19 Vaccine (7 - 2025-26 season) 05/23/2024   Medicare Annual Wellness (AWV)  07/19/2025   DTaP/Tdap/Td (2 - Td or Tdap) 09/12/2026   Pneumococcal Vaccine: 50+ Years  Completed   Zoster Vaccines- Shingrix  Completed   Meningococcal B Vaccine  Aged Out   Colonoscopy  Discontinued   Hepatitis C Screening  Discontinued    Health Maintenance Items Addressed: Vaccines Due: and discussed , See Nurse Notes at the end of this note  Additional Screening:  Vision Screening: Recommended annual ophthalmology exams for early detection of glaucoma and other disorders of the eye. Is the patient up to date with their annual eye exam?  Yes  Who is the provider or what is the name of the office in which the patient attends annual eye exams? Dr Adine [facility]   Dental Screening: Recommended annual dental exams for proper oral hygiene  Community Resource Referral / Chronic Care Management: CRR required this visit?  No   CCM required this visit?  No   Plan:    I have personally reviewed and noted the following in the patient's chart:   Medical and social history Use of alcohol, tobacco or illicit drugs  Current medications and supplements including opioid prescriptions. Patient is not currently taking opioid prescriptions. Functional ability and status Nutritional status Physical activity Advanced directives List of other physicians Hospitalizations, surgeries, and ER visits in previous 12 months Vitals Screenings to include cognitive, depression, and falls Referrals and appointments  In addition, I have reviewed and discussed with patient certain preventive protocols, quality metrics, and best practice recommendations. A written personalized care plan for preventive services as well as general preventive health recommendations were provided to patient.   Ellouise VEAR Haws, LPN   [id number]    After Visit Summary: (In Person-Printed) AVS printed and given to the patient  Notes: Nothing significant to report at this time.

## 2024-07-26 ENCOUNTER — Emergency Department (HOSPITAL_BASED_OUTPATIENT_CLINIC_OR_DEPARTMENT_OTHER): Admitting: Radiology

## 2024-07-26 ENCOUNTER — Emergency Department (HOSPITAL_BASED_OUTPATIENT_CLINIC_OR_DEPARTMENT_OTHER)
Admission: EM | Admit: 2024-07-26 | Discharge: 2024-07-26 | Disposition: A | Discharge status: Home / Self Care | Attending: Emergency Medicine | Admitting: Emergency Medicine

## 2024-07-26 ENCOUNTER — Encounter (HOSPITAL_BASED_OUTPATIENT_CLINIC_OR_DEPARTMENT_OTHER): Payer: Self-pay

## 2024-07-26 ENCOUNTER — Emergency Department (HOSPITAL_BASED_OUTPATIENT_CLINIC_OR_DEPARTMENT_OTHER)

## 2024-07-26 ENCOUNTER — Other Ambulatory Visit: Payer: Self-pay

## 2024-07-26 DIAGNOSIS — Y92007 Garden or yard of unspecified non-institutional (private) residence as the place of occurrence of the external cause: Secondary | ICD-10-CM | POA: Insufficient documentation

## 2024-07-26 DIAGNOSIS — S46911A Strain of unspecified muscle, fascia and tendon at shoulder and upper arm level, right arm, initial encounter: Secondary | ICD-10-CM | POA: Insufficient documentation

## 2024-07-26 DIAGNOSIS — W19XXXA Unspecified fall, initial encounter: Secondary | ICD-10-CM

## 2024-07-26 DIAGNOSIS — Z7982 Long term (current) use of aspirin: Secondary | ICD-10-CM | POA: Diagnosis not present

## 2024-07-26 DIAGNOSIS — W1839XA Other fall on same level, initial encounter: Secondary | ICD-10-CM | POA: Diagnosis not present

## 2024-07-26 DIAGNOSIS — S4991XA Unspecified injury of right shoulder and upper arm, initial encounter: Secondary | ICD-10-CM | POA: Diagnosis present

## 2024-07-26 DIAGNOSIS — S0990XA Unspecified injury of head, initial encounter: Secondary | ICD-10-CM | POA: Diagnosis present

## 2024-07-26 DIAGNOSIS — S0081XA Abrasion of other part of head, initial encounter: Secondary | ICD-10-CM | POA: Diagnosis not present

## 2024-07-26 DIAGNOSIS — Z8546 Personal history of malignant neoplasm of prostate: Secondary | ICD-10-CM | POA: Diagnosis not present

## 2024-07-26 DIAGNOSIS — Z043 Encounter for examination and observation following other accident: Secondary | ICD-10-CM | POA: Diagnosis not present

## 2024-07-26 DIAGNOSIS — M47812 Spondylosis without myelopathy or radiculopathy, cervical region: Secondary | ICD-10-CM | POA: Diagnosis not present

## 2024-07-26 MED ORDER — DEXAMETHASONE SOD PHOSPHATE PF 10 MG/ML IJ SOLN
10.0000 mg | Freq: Once | INTRAMUSCULAR | Status: AC
  Administered 2024-07-26: 10 mg via INTRAMUSCULAR

## 2024-07-26 MED ORDER — BACITRACIN ZINC 500 UNIT/GM EX OINT
TOPICAL_OINTMENT | Freq: Once | CUTANEOUS | Status: AC
  Administered 2024-07-26: 31.5 via TOPICAL
  Filled 2024-07-26: qty 28.35

## 2024-07-26 NOTE — ED Provider Notes (Signed)
 [place] EMERGENCY DEPARTMENT AT [facility] Provider Note   CSN: [id number] Arrival date & time: 07/26/24  1234     Patient presents with: Fall and Head Injury   Harold Leach is a 83 y.o. male.   Pt is an 82 yo male with pmhx significant for prostate cancer, gerd, hld, and s/p TAVR.  He presents today with a with fall in the yard today.  He was using his blower with his right arm and could not break his fall.  He did fall directly on his face and sustained an abrasion to his nose.  His main complaint is his right shoulder.  He can move it, but it hurts a lot.  He is not sure what he did to his shoulder.  No loc.  No blood thinners.  He is ambulatory.       Prior to Admission medications   Medication Sig Start Date End Date Taking? Authorizing Provider  amoxicillin  (AMOXIL ) 500 MG tablet Take 4 tablets by mouth one hour prior to dental appointments. Patient not taking: Reported on 07/19/2024 08/06/22   Sebastian Lamarr SAUNDERS, PA-C  aspirin  EC 81 MG tablet Take 81 mg by mouth daily. Swallow whole.    [provider]  Cholecalciferol (D3-1000 PO) Take 1,000 Units by mouth every other day.    [provider]  clobetasol  cream (TEMOVATE ) 0.05 % APPLY TOPICALLY TO THE AFFECTED AREA TWICE DAILY 03/03/22   Job Lukes, PA  Ginkgo Biloba 60 MG CAPS Take 60 mg by mouth daily.    [provider]  MAGNESIUM  GLYCINATE PO Take 2 capsules by mouth daily at 6 (six) AM.    [provider]  magnesium  oxide (MAG-OX) 400 (240 Mg) MG tablet Take 400 mg by mouth daily.    [provider]  Melatonin 10 MG TABS Take 10 mg by mouth at bedtime.    [provider]  memantine  (NAMENDA ) 5 MG tablet Take 1 tablet (5 mg at night)  twice a day 12/18/23   Wertman, Sara E, PA-C  mirtazapine  (REMERON ) 30 MG tablet TAKE 1 TO 1 AND 1/2 TABLETS BY MOUTH AT NIGHT 06/09/24   Job Lukes, PA  Multiple Vitamins-Minerals (PRESERVISION AREDS 2+MULTI  VIT PO) Take 2 capsules by mouth daily.    [provider]  pantoprazole  (PROTONIX ) 40 MG tablet TAKE 1 TABLET(40 MG) BY MOUTH DAILY 03/07/24   Job Lukes, PA  Polyethyl Glycol-Propyl Glycol (SYSTANE OP) Place 1 drop into both eyes daily as needed (dry eye).    [provider]  Probiotic Product (ALIGN DUALBIOTIC PO) Take by mouth.    [provider]  rosuvastatin  (CRESTOR ) 5 MG tablet Take 1 tablet (5 mg total) by mouth daily. 07/28/23   Job Lukes, PA  traZODone  (DESYREL ) 150 MG tablet TAKE 1 TABLET(150 MG) BY MOUTH AT BEDTIME AS NEEDED FOR SLEEP 08/05/23   Job Lukes, PA  Turmeric (QC TUMERIC COMPLEX PO) Take 1,000 mg by mouth daily.    [provider]    Allergies: Patient has no known allergies.    Review of Systems  HENT:         Facial abrasion  Musculoskeletal:        R shoulder pain  All other systems reviewed and are negative.   Updated Vital Signs BP 139/81 (BP Location: Right Arm)   Pulse 62   Temp 97.8 F (36.6 C) (Temporal)   Resp 18   SpO2 96%   Physical Exam Vitals  and nursing note reviewed.  Constitutional:      Appearance: Normal appearance.  HENT:     Head: Normocephalic and atraumatic.     Right Ear: External ear normal.     Left Ear: External ear normal.     Nose:     Comments: Abrasions to bridge of nose and to forehead    Mouth/Throat:     Mouth: Mucous membranes are moist.     Pharynx: Oropharynx is clear.  Eyes:     Extraocular Movements: Extraocular movements intact.     Conjunctiva/sclera: Conjunctivae normal.     Pupils: Pupils are equal, round, and reactive to light.  Cardiovascular:     Rate and Rhythm: Normal rate and regular rhythm.     Pulses: Normal pulses.     Heart sounds: Normal heart sounds.  Pulmonary:     Effort: Pulmonary effort is normal.     Breath sounds: Normal breath sounds.  Abdominal:     General: Abdomen is flat. Bowel sounds are normal.     Palpations: Abdomen is  soft.  Musculoskeletal:     Right shoulder: Tenderness present. No swelling or deformity. Decreased range of motion.     Cervical back: Normal range of motion and neck supple.  Skin:    General: Skin is warm.     Capillary Refill: Capillary refill takes less than 2 seconds.  Neurological:     General: No focal deficit present.     Mental Status: He is alert and oriented to person, place, and time.  Psychiatric:        Mood and Affect: Mood normal.        Behavior: Behavior normal.     (all labs ordered are listed, but only abnormal results are displayed) Labs Reviewed - No data to display  EKG: None  Radiology: CT Cervical Spine Wo Contrast Result Date: 07/26/2024 EXAM: CT CERVICAL SPINE WITHOUT CONTRAST 07/26/2024 01:22:09 PM TECHNIQUE: CT of the cervical spine was performed without the administration of intravenous contrast. Multiplanar reformatted images are provided for review. Automated exposure control, iterative reconstruction, and/or weight based adjustment of the mA/kV was utilized to reduce the radiation dose to as low as reasonably achievable. COMPARISON: None available. CLINICAL HISTORY: fall FINDINGS: CERVICAL SPINE: BONES AND ALIGNMENT: No acute fracture or traumatic malalignment. DEGENERATIVE CHANGES: Multilevel spondylosis, disc space height loss, and degenerative endplate changes greatest at C3-C7 where it is moderate to advanced. No severe spinal canal narrowing. Ankylosis of the right C2-C3 facets. SOFT TISSUES: No prevertebral soft tissue swelling. IMPRESSION: 1. No acute abnormality of the cervical spine related to the fall. Electronically signed by: Norman Gatlin MD 07/26/2024 01:54 PM EST RP Workstation: [id number]   CT Head Wo Contrast Result Date: 07/26/2024 EXAM: CT HEAD WITHOUT CONTRAST 07/26/2024 01:22:09 PM TECHNIQUE: CT of the head was performed without the administration of intravenous contrast. Automated exposure control, iterative reconstruction, and/or  weight based adjustment of the mA/kV was utilized to reduce the radiation dose to as low as reasonably achievable. COMPARISON: MRI head 1 / 10 / 25 CLINICAL HISTORY: fall FINDINGS: BRAIN AND VENTRICLES: No acute hemorrhage. No evidence of acute infarct. No hydrocephalus. No extra-axial collection. No mass effect or midline shift. ORBITS: No acute abnormality. SINUSES: No acute abnormality. SOFT TISSUES AND SKULL: No acute soft tissue abnormality. No skull fracture. IMPRESSION: 1. No acute intracranial abnormality. Electronically signed by: Norman Gatlin MD 07/26/2024 01:52 PM EST RP Workstation: [id number]   DG Shoulder Right Result Date: 07/26/2024 CLINICAL  DATA:  Fall with right shoulder pain. EXAM: RIGHT SHOULDER - 2+ VIEW COMPARISON:  Chest x-ray 07/18/2022 FINDINGS: Mild diffuse decreased bone mineralization. Mild degenerative change of the [facility] joint and glenohumeral joints. No acute fracture or dislocation. Remainder of the exam is unremarkable. IMPRESSION: 1. No acute findings. 2. Mild degenerative changes. Electronically Signed   By: Toribio Agreste M.D.   On: 07/26/2024 13:41     Procedures   Medications Ordered in the ED  dexamethasone  (DECADRON ) injection 10 mg (has no administration in time range)  bacitracin ointment (has no administration in time range)                                    Medical Decision Making Amount and/or Complexity of Data Reviewed Radiology: ordered.  Risk OTC drugs. Prescription drug management.   This patient presents to the ED for concern of fall, this involves an extensive number of treatment options, and is a complaint that carries with it a high risk of complications and morbidity.  The differential diagnosis includes multiple trauma   Co morbidities that complicate the patient evaluation  prostate cancer, gerd, hld, and s/p TAVR   Additional history obtained:  Additional history obtained from epic chart review External records from outside  source obtained and reviewed including wife  Imaging Studies ordered:  I ordered imaging studies including ct head/ct c-spine/r shoulder  I independently visualized and interpreted imaging which showed  CT head: No acute intracranial abnormality.  CT cervical spine: No acute abnormality of the cervical spine related to the fall.  R shoulder: No acute findings.  2. Mild degenerative changes.   I agree with the radiologist interpretation   Medicines ordered and prescription drug management:  I ordered medication including decadron   for sx  Reevaluation of the patient after these medicines showed that the patient improved I have reviewed the patients home medicines and have made adjustments as needed   Test Considered:  ct   Problem List / ED Course:  Fall with facial abrasions:  nothing acute in brain/c-spine Right shoulder strain:  likely rotator cuff injury.  No fx.  Pt instructed to f/u with ortho.  He's given a sling as needed.  He did not want any pain meds.  Social Determinants of Health:  Lives at home   Dispostion:  After consideration of the diagnostic results and the patients response to treatment, I feel that the patent would benefit from discharge with outpatient f/u.       Final diagnoses:  Shoulder strain, right, initial encounter  Fall, initial encounter  Facial abrasion, initial encounter    ED Discharge Orders     None          Dean Clarity, MD 07/26/24 1656

## 2024-07-26 NOTE — ED Triage Notes (Signed)
 Patient reports falling while out working in the yard. He was unable to break his fall and fell face first. He has an abrasion on his nose. -LOC, -anticoagulants. Was told to come here for head CT.

## 2024-07-27 ENCOUNTER — Ambulatory Visit (INDEPENDENT_AMBULATORY_CARE_PROVIDER_SITE_OTHER): Payer: Medicare Other | Admitting: Physician Assistant

## 2024-07-27 ENCOUNTER — Encounter: Payer: Self-pay | Admitting: Physician Assistant

## 2024-07-27 VITALS — BP 122/70 | HR 53 | Temp 97.8°F | Ht 69.5 in | Wt 164.0 lb

## 2024-07-27 DIAGNOSIS — E7800 Pure hypercholesterolemia, unspecified: Secondary | ICD-10-CM | POA: Diagnosis not present

## 2024-07-27 DIAGNOSIS — R413 Other amnesia: Secondary | ICD-10-CM | POA: Diagnosis not present

## 2024-07-27 DIAGNOSIS — F5104 Psychophysiologic insomnia: Secondary | ICD-10-CM

## 2024-07-27 DIAGNOSIS — G5793 Unspecified mononeuropathy of bilateral lower limbs: Secondary | ICD-10-CM | POA: Diagnosis not present

## 2024-07-27 DIAGNOSIS — M25511 Pain in right shoulder: Secondary | ICD-10-CM

## 2024-07-27 DIAGNOSIS — G4700 Insomnia, unspecified: Secondary | ICD-10-CM | POA: Diagnosis not present

## 2024-07-27 DIAGNOSIS — Z8546 Personal history of malignant neoplasm of prostate: Secondary | ICD-10-CM | POA: Diagnosis not present

## 2024-07-27 LAB — LIPID PANEL
Cholesterol: 143 mg/dL (ref 0–200)
HDL: 58.5 mg/dL (ref >39.00)
LDL Cholesterol: 77 mg/dL (ref 0–99)
NonHDL: 84.21
Total CHOL/HDL Ratio: 2
Triglycerides: 38 mg/dL (ref 0.0–149.0)
VLDL: 7.6 mg/dL (ref 0.0–40.0)

## 2024-07-27 LAB — COMPREHENSIVE METABOLIC PANEL WITH GFR
ALT: 26 U/L (ref 0–53)
AST: 30 U/L (ref 0–37)
Albumin: 4.5 g/dL (ref 3.5–5.2)
Alkaline Phosphatase: 57 U/L (ref 39–117)
BUN: 36 mg/dL — ABNORMAL HIGH (ref 6–23)
CO2: 29 meq/L (ref 19–32)
Calcium: 9.4 mg/dL (ref 8.4–10.5)
Chloride: 102 meq/L (ref 96–112)
Creatinine, Ser: 1.27 mg/dL (ref 0.40–1.50)
GFR: 53.18 mL/min — ABNORMAL LOW (ref >60.00)
Glucose, Bld: 126 mg/dL — ABNORMAL HIGH (ref 70–99)
Potassium: 4.9 meq/L (ref 3.5–5.1)
Sodium: 138 meq/L (ref 135–145)
Total Bilirubin: 0.5 mg/dL (ref 0.2–1.2)
Total Protein: 7 g/dL (ref 6.0–8.3)

## 2024-07-27 LAB — CBC WITH DIFFERENTIAL/PLATELET
Basophils Absolute: 0 K/uL (ref 0.0–0.1)
Basophils Relative: 0.1 % (ref 0.0–3.0)
Eosinophils Absolute: 0 K/uL (ref 0.0–0.7)
Eosinophils Relative: 0 % (ref 0.0–5.0)
HCT: 37.8 % — ABNORMAL LOW (ref 39.0–52.0)
Hemoglobin: 12.4 g/dL — ABNORMAL LOW (ref 13.0–17.0)
Lymphocytes Relative: 6.4 % — ABNORMAL LOW (ref 12.0–46.0)
Lymphs Abs: 0.6 K/uL — ABNORMAL LOW (ref 0.7–4.0)
MCHC: 32.8 g/dL (ref 30.0–36.0)
MCV: 81.8 fl (ref 78.0–100.0)
Monocytes Absolute: 0.4 K/uL (ref 0.1–1.0)
Monocytes Relative: 4 % (ref 3.0–12.0)
Neutro Abs: 8.1 K/uL — ABNORMAL HIGH (ref 1.4–7.7)
Neutrophils Relative %: 89.5 % — ABNORMAL HIGH (ref 43.0–77.0)
Platelets: 152 K/uL (ref 150.0–400.0)
RBC: 4.63 Mil/uL (ref 4.22–5.81)
RDW: 15 % (ref 11.5–15.5)
WBC: 9.1 K/uL (ref 4.0–10.5)

## 2024-07-27 MED ORDER — MELOXICAM 7.5 MG PO TABS: ORAL_TABLET | ORAL | 0 refills | Status: DC

## 2024-07-27 NOTE — Progress Notes (Signed)
 Subjective:    Harold Leach is a 83 y.o. male and is here for a comprehensive physical exam.  HPI  There are no preventive care reminders to display for this patient.  Discussed the use of AI scribe software for clinical note transcription with the patient, who gave verbal consent to proceed.  History of Present Illness   Harold Leach is an 83 year old male who presents with shoulder pain following a fall.  He fell while using a leaf blower, landing on his knees, chest, and face. His right arm was unable to break the fall due to holding the blower, and his left arm did not assist. His face was impacted, causing scratches on his glasses and discomfort on his nose. His knees and chest are not significantly injured.  He developed shoulder pain after the fall, particularly when lifting his arm, with pain localized to the bicep area. He received a steroid injection for a severe sprain but has not taken any pain medication like Tylenol  or ibuprofen. He is currently on 81 mg aspirin  daily.  He is undergoing treatment for nerve issues at [facility], including infrared light therapy and electrical stimulation, which he started approximately 90 days ago. He takes vitamins twice a day as part of this regimen. He has made dietary changes and is on cholesterol medication. He is fasting today for blood work to assess cholesterol levels. He takes memantine  for memory preservation with no reported worsening of memory.      health Maintenance: Immunizations -- needs updated pneumonia shot -- he will get at pharmacy Colonoscopy -- requesting records -- probably UpToDate as it was in 2017 and reports there were no polyps PSA --  Lab Results  Component Value Date   PSA 0.01 (L) 07/27/2023   PSA 0.00 (L) 07/25/2022   PSA 0.00 (L) 08/02/2021   Diet -- healthy Sleep habits -- overall stable Exercise -- very active daily  Weight -- Weight: 164 lb (74.4 kg)  Recent weight  history Wt Readings from Last 10 Encounters:  07/27/24 164 lb (74.4 kg)  07/19/24 163 lb (73.9 kg)  01/20/24 162 lb (73.5 kg)  12/23/23 161 lb (73 kg)  12/18/23 163 lb (73.9 kg)  09/29/23 167 lb (75.8 kg)  09/18/23 163 lb (73.9 kg)  07/27/23 165 lb (74.8 kg)  07/14/23 160 lb (72.6 kg)  07/03/23 160 lb 3.2 oz (72.7 kg)   Body mass index is 23.87 kg/m.  Mood -- stable Alcohol use --  reports no history of alcohol use.  Tobacco use --  Tobacco Use: Medium Risk (07/27/2024)   Patient History    Smoking Tobacco Use: Former    Smokeless Tobacco Use: Never    Passive Exposure: Not on file    Eligible for Low Dose CT? no  UTD with eye doctor? yes UTD with dentist? yes     07/27/2024    8:29 AM  Depression screen PHQ 2/9  Decreased Interest 0  Down, Depressed, Hopeless 0  PHQ - 2 Score 0    Other providers/specialists: Patient Care Team: Job Lukes, [place] as PCP - General (Physician Assistant) Lavona Agent, MD as PCP - Cardiology (Cardiology) Renda Glance, MD as Consulting Physician (Urology)    PMHx, SurgHx, SocialHx, Medications, and Allergies were reviewed in the Visit Navigator and updated as appropriate.   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 Wendel and Dr. Murriel.     Past Surgical History:  Procedure Laterality Date   ABDOMINAL AORTOGRAM N/A 06/06/2022   Procedure: ABDOMINAL AORTOGRAM;  Surgeon: Wendel Lurena POUR, MD;  Location: [facility] INVASIVE CV LAB;  Service: Cardiovascular;  Laterality: N/A;   BALLOON AORTIC  VALVE VALVULOPLASTY  07/22/2022   Procedure: BALLOON AORTIC VALVE VALVULOPLASTY;  Surgeon: Wendel Lurena POUR, MD;  Location: [facility] OR;  Service: Open Heart Surgery;;   HERNIA REPAIR     INTRAOPERATIVE TRANSTHORACIC ECHOCARDIOGRAM N/A 07/22/2022   Procedure: INTRAOPERATIVE TRANSTHORACIC ECHOCARDIOGRAM;  Surgeon: Wendel Lurena POUR, MD;  Location: [facility];  Service: Open Heart Surgery;  Laterality: N/A;   PROSTATE SURGERY  2012   RIGHT/LEFT HEART CATH AND CORONARY ANGIOGRAPHY N/A 06/06/2022   Procedure: RIGHT/LEFT HEART CATH AND CORONARY ANGIOGRAPHY;  Surgeon: Wendel Lurena POUR, MD;  Location: [facility] INVASIVE CV LAB;  Service: Cardiovascular;  Laterality: N/A;   TRANSCATHETER AORTIC VALVE REPLACEMENT, TRANSFEMORAL N/A 07/22/2022   Procedure: Transcatheter Aortic Valve Replacement, Transfemoral;  Surgeon: Thukkani, Arun K, MD;  Location: [facility] OR;  Service: Open Heart Surgery;  Laterality: N/A;   ULTRASOUND GUIDANCE FOR VASCULAR ACCESS N/A 07/22/2022   Procedure: ULTRASOUND GUIDANCE FOR VASCULAR ACCESS;  Surgeon: Wendel Lurena POUR, MD;  Location: [facility] OR;  Service: Open Heart Surgery;  Laterality: N/A;     Family History  Problem Relation Age of Onset   Heart disease Mother        CHF   Hypertension Mother    Dementia Mother        concussion x 2   Prostate cancer Father    Valvular heart disease Brother    Heart disease Maternal Grandmother    Stroke Maternal Grandmother     Social History   Tobacco Use   Smoking status: Former    Types: Pipe    Quit date: 03/20/1981    Years since quitting: 43.3   Smokeless tobacco: Never   Tobacco comments:    Quit in 1983 pipe smoker   Vaping Use   Vaping status: Never Used  Substance Use Topics   Alcohol use: No   Drug use: No    Review of Systems:   Review of Systems  Constitutional:  Negative for chills, fever, malaise/fatigue and weight loss.  HENT:  Negative for hearing loss, sinus pain and sore throat.   Respiratory:  Negative for cough and  hemoptysis.   Cardiovascular:  Negative for chest pain, palpitations, leg swelling and PND.  Gastrointestinal:  Negative for abdominal pain, constipation, diarrhea, heartburn, nausea and vomiting.  Genitourinary:  Negative for dysuria, frequency and urgency.  Musculoskeletal:  Positive for joint pain. Negative for back pain, myalgias and neck pain.  Skin:  Negative for itching and rash.  Neurological:  Negative for dizziness, tingling, seizures and headaches.  Endo/Heme/Allergies:  Negative for polydipsia.  Psychiatric/Behavioral:  Negative for depression. The patient is not nervous/anxious.     Objective:    Vitals:   07/27/24 0824  BP: 122/70  Pulse: (!) 53  Temp: 97.8 F (36.6 C)  SpO2: 96%    Body mass index is 23.87 kg/m.  General  Alert, cooperative, no distress, appears stated age  Head:  Normocephalic, without obvious abnormality, atraumatic  Eyes:  PERRL, conjunctiva/corneas clear, EOM's intact, fundi benign, both eyes       Ears:  Normal TM's and external ear canals, both ears  Nose: Nares normal, septum midline, mucosa normal, no drainage or sinus tenderness  Throat: Lips, mucosa, and tongue normal; teeth and gums normal  Neck: Supple, symmetrical, trachea midline, no adenopathy;     thyroid :  No enlargement/tenderness/nodules; no carotid bruit or JVD  Back:   Symmetric, no curvature, ROM normal, no CVA tenderness  Lungs:   Clear to auscultation bilaterally, respirations unlabored  Chest wall:  No tenderness or deformity  Heart:  Regular rate and rhythm, S1 and S2 normal, no murmur, rub or gallop  Abdomen:   Soft, non-tender, bowel sounds active all four quadrants, no masses, no organomegaly  Extremities: Extremities normal, atraumatic, no cyanosis or edema Right shoulder with very limited range of motion   Prostate : Deferred  Skin: Skin color, texture, turgor normal, no rashes or lesions  Lymph nodes: Cervical, supraclavicular, and axillary nodes normal   Neurologic: CNII-XII grossly intact. Normal strength, sensation and reflexes throughout   AssessmentPlan:   Assessment and Plan    Acute right shoulder pain Pain localized to bicep area, exacerbated by movement. X-rays pending. Discussed risk of frozen shoulder with improper use. - Referred to [facility] for further evaluation. - Prescribed non-narcotic anti-inflammatory for pain -- meloxicam 7.5 mg daily  Hypercholesterolemia Managed with dietary changes and medication. Interested in discontinuing medication. Blood work ordered to assess levels. - Ordered blood work to assess cholesterol levels.  Insomnia Improved sleep with mirtazapine  and trazodone . - Continue mirtazapine  and trazodone .  Memory impairment Managed with memantine . No symptom worsening or side effects. - Continue memantine .  Peripheral neuropathy Managed with treatment at [facility]. Reports 45% nerve loss. Treatment includes infrared light therapy and electrical stimulation. - Continue treatment regimen at [facility].  Personal history of malignant neoplasm of prostate No current urinary or pelvic concerns. - Ordered yearly PSA test.            Lucie Buttner, PA-C [facility]

## 2024-07-28 ENCOUNTER — Other Ambulatory Visit: Payer: Self-pay | Admitting: Physician Assistant

## 2024-07-28 ENCOUNTER — Ambulatory Visit: Payer: Self-pay | Admitting: Physician Assistant

## 2024-07-28 DIAGNOSIS — R710 Precipitous drop in hematocrit: Secondary | ICD-10-CM

## 2024-07-28 LAB — PSA: PSA: 0 ng/mL — ABNORMAL LOW (ref 0.10–4.00)

## 2024-08-01 DIAGNOSIS — R531 Weakness: Secondary | ICD-10-CM | POA: Diagnosis not present

## 2024-08-01 DIAGNOSIS — M25511 Pain in right shoulder: Secondary | ICD-10-CM | POA: Diagnosis not present

## 2024-08-01 DIAGNOSIS — S46011A Strain of muscle(s) and tendon(s) of the rotator cuff of right shoulder, initial encounter: Secondary | ICD-10-CM | POA: Diagnosis not present

## 2024-08-05 ENCOUNTER — Encounter: Payer: Self-pay | Admitting: Physician Assistant

## 2024-08-05 DIAGNOSIS — M25511 Pain in right shoulder: Secondary | ICD-10-CM | POA: Diagnosis not present

## 2024-08-05 DIAGNOSIS — R531 Weakness: Secondary | ICD-10-CM | POA: Diagnosis not present

## 2024-08-05 DIAGNOSIS — S46011A Strain of muscle(s) and tendon(s) of the rotator cuff of right shoulder, initial encounter: Secondary | ICD-10-CM | POA: Diagnosis not present

## 2024-08-08 ENCOUNTER — Institutional Professional Consult (permissible substitution): Payer: Medicare Other | Admitting: Psychology

## 2024-08-08 ENCOUNTER — Ambulatory Visit: Payer: Self-pay

## 2024-08-10 DIAGNOSIS — R531 Weakness: Secondary | ICD-10-CM | POA: Diagnosis not present

## 2024-08-10 DIAGNOSIS — S46011A Strain of muscle(s) and tendon(s) of the rotator cuff of right shoulder, initial encounter: Secondary | ICD-10-CM | POA: Diagnosis not present

## 2024-08-10 DIAGNOSIS — M25511 Pain in right shoulder: Secondary | ICD-10-CM | POA: Diagnosis not present

## 2024-08-12 DIAGNOSIS — M25511 Pain in right shoulder: Secondary | ICD-10-CM | POA: Diagnosis not present

## 2024-08-12 DIAGNOSIS — S46011A Strain of muscle(s) and tendon(s) of the rotator cuff of right shoulder, initial encounter: Secondary | ICD-10-CM | POA: Diagnosis not present

## 2024-08-12 DIAGNOSIS — R531 Weakness: Secondary | ICD-10-CM | POA: Diagnosis not present

## 2024-08-15 ENCOUNTER — Encounter: Payer: Medicare Other | Admitting: Psychology

## 2024-08-17 DIAGNOSIS — R531 Weakness: Secondary | ICD-10-CM | POA: Diagnosis not present

## 2024-08-17 DIAGNOSIS — S46011A Strain of muscle(s) and tendon(s) of the rotator cuff of right shoulder, initial encounter: Secondary | ICD-10-CM | POA: Diagnosis not present

## 2024-08-17 DIAGNOSIS — M25511 Pain in right shoulder: Secondary | ICD-10-CM | POA: Diagnosis not present

## 2024-08-24 DIAGNOSIS — S46011A Strain of muscle(s) and tendon(s) of the rotator cuff of right shoulder, initial encounter: Secondary | ICD-10-CM | POA: Diagnosis not present

## 2024-08-24 DIAGNOSIS — R531 Weakness: Secondary | ICD-10-CM | POA: Diagnosis not present

## 2024-08-24 DIAGNOSIS — M25511 Pain in right shoulder: Secondary | ICD-10-CM | POA: Diagnosis not present

## 2024-08-25 ENCOUNTER — Other Ambulatory Visit

## 2024-08-26 ENCOUNTER — Other Ambulatory Visit: Payer: Self-pay | Admitting: Physician Assistant

## 2024-08-29 ENCOUNTER — Other Ambulatory Visit

## 2024-08-29 DIAGNOSIS — R710 Precipitous drop in hematocrit: Secondary | ICD-10-CM

## 2024-08-29 DIAGNOSIS — S46011A Strain of muscle(s) and tendon(s) of the rotator cuff of right shoulder, initial encounter: Secondary | ICD-10-CM | POA: Diagnosis not present

## 2024-08-29 DIAGNOSIS — M25511 Pain in right shoulder: Secondary | ICD-10-CM | POA: Diagnosis not present

## 2024-08-29 DIAGNOSIS — R531 Weakness: Secondary | ICD-10-CM | POA: Diagnosis not present

## 2024-08-29 LAB — CBC WITH DIFFERENTIAL/PLATELET
Basophils Absolute: 0.1 K/uL (ref 0.0–0.1)
Basophils Relative: 1.1 % (ref 0.0–3.0)
Eosinophils Absolute: 0.1 K/uL (ref 0.0–0.7)
Eosinophils Relative: 2.1 % (ref 0.0–5.0)
HCT: 36.5 % — ABNORMAL LOW (ref 39.0–52.0)
Hemoglobin: 12 g/dL — ABNORMAL LOW (ref 13.0–17.0)
Lymphocytes Relative: 18.6 % (ref 12.0–46.0)
Lymphs Abs: 0.9 K/uL (ref 0.7–4.0)
MCHC: 32.8 g/dL (ref 30.0–36.0)
MCV: 80.7 fl (ref 78.0–100.0)
Monocytes Absolute: 0.6 K/uL (ref 0.1–1.0)
Monocytes Relative: 10.9 % (ref 3.0–12.0)
Neutro Abs: 3.4 K/uL (ref 1.4–7.7)
Neutrophils Relative %: 67.3 % (ref 43.0–77.0)
Platelets: 147 K/uL — ABNORMAL LOW (ref 150.0–400.0)
RBC: 4.53 Mil/uL (ref 4.22–5.81)
RDW: 16.1 % — ABNORMAL HIGH (ref 11.5–15.5)
WBC: 5.1 K/uL (ref 4.0–10.5)

## 2024-08-29 LAB — IBC + FERRITIN
Ferritin: 7.9 ng/mL — ABNORMAL LOW (ref 22.0–322.0)
Iron: 36 ug/dL — ABNORMAL LOW (ref 42–165)
Saturation Ratios: 7.5 % — ABNORMAL LOW (ref 20.0–50.0)
TIBC: 481.6 ug/dL — ABNORMAL HIGH (ref 250.0–450.0)
Transferrin: 344 mg/dL (ref 212.0–360.0)

## 2024-08-29 LAB — B12 AND FOLATE PANEL
Folate: 17.5 ng/mL (ref >5.9)
Vitamin B-12: 452 pg/mL (ref 211–911)

## 2024-08-30 ENCOUNTER — Other Ambulatory Visit: Payer: Self-pay

## 2024-08-30 ENCOUNTER — Ambulatory Visit: Payer: Self-pay | Admitting: Physician Assistant

## 2024-08-30 DIAGNOSIS — Z1211 Encounter for screening for malignant neoplasm of colon: Secondary | ICD-10-CM

## 2024-08-30 DIAGNOSIS — D509 Iron deficiency anemia, unspecified: Secondary | ICD-10-CM

## 2024-08-31 ENCOUNTER — Other Ambulatory Visit

## 2024-08-31 DIAGNOSIS — R710 Precipitous drop in hematocrit: Secondary | ICD-10-CM | POA: Diagnosis not present

## 2024-08-31 DIAGNOSIS — R531 Weakness: Secondary | ICD-10-CM | POA: Diagnosis not present

## 2024-08-31 DIAGNOSIS — S46011A Strain of muscle(s) and tendon(s) of the rotator cuff of right shoulder, initial encounter: Secondary | ICD-10-CM | POA: Diagnosis not present

## 2024-08-31 DIAGNOSIS — M25511 Pain in right shoulder: Secondary | ICD-10-CM | POA: Diagnosis not present

## 2024-09-02 LAB — FECAL OCCULT BLOOD, IMMUNOCHEMICAL: Fecal Occult Bld: NEGATIVE

## 2024-09-07 DIAGNOSIS — M75101 Unspecified rotator cuff tear or rupture of right shoulder, not specified as traumatic: Secondary | ICD-10-CM | POA: Diagnosis not present

## 2024-09-07 DIAGNOSIS — M25511 Pain in right shoulder: Secondary | ICD-10-CM | POA: Diagnosis not present

## 2024-09-11 ENCOUNTER — Other Ambulatory Visit: Payer: Self-pay | Admitting: Physician Assistant

## 2024-10-04 LAB — HM COLONOSCOPY

## 2024-10-06 ENCOUNTER — Other Ambulatory Visit: Payer: Self-pay | Admitting: Physician Assistant

## 2024-12-19 ENCOUNTER — Ambulatory Visit: Admitting: Physician Assistant

## 2024-12-28 ENCOUNTER — Ambulatory Visit: Admitting: Cardiology

## 2025-01-02 ENCOUNTER — Ambulatory Visit: Payer: Self-pay

## 2025-01-02 ENCOUNTER — Institutional Professional Consult (permissible substitution): Admitting: Psychology

## 2025-01-09 ENCOUNTER — Encounter: Admitting: Psychology

## 2025-07-28 ENCOUNTER — Ambulatory Visit: Admitting: Physician Assistant

## 2025-07-31 ENCOUNTER — Ambulatory Visit
